# Patient Record
Sex: Male | Born: 1945 | Race: White | Hispanic: No | Marital: Single | Smoking: Current every day smoker
Health system: Southern US, Community
[De-identification: ages and names within clinical notes are randomized; demographics above are authoritative.]

## PROBLEM LIST (undated history)

## (undated) DIAGNOSIS — H539 Unspecified visual disturbance: Secondary | ICD-10-CM

## (undated) DIAGNOSIS — K219 Gastro-esophageal reflux disease without esophagitis: Secondary | ICD-10-CM

## (undated) DIAGNOSIS — N529 Male erectile dysfunction, unspecified: Secondary | ICD-10-CM

## (undated) DIAGNOSIS — N400 Enlarged prostate without lower urinary tract symptoms: Secondary | ICD-10-CM

## (undated) DIAGNOSIS — F32A Depression, unspecified: Secondary | ICD-10-CM

## (undated) DIAGNOSIS — H269 Unspecified cataract: Secondary | ICD-10-CM

## (undated) DIAGNOSIS — E291 Testicular hypofunction: Secondary | ICD-10-CM

## (undated) DIAGNOSIS — E782 Mixed hyperlipidemia: Secondary | ICD-10-CM

## (undated) DIAGNOSIS — I10 Essential (primary) hypertension: Secondary | ICD-10-CM

## (undated) DIAGNOSIS — F329 Major depressive disorder, single episode, unspecified: Secondary | ICD-10-CM

## (undated) DIAGNOSIS — G459 Transient cerebral ischemic attack, unspecified: Secondary | ICD-10-CM

## (undated) DIAGNOSIS — J449 Chronic obstructive pulmonary disease, unspecified: Secondary | ICD-10-CM

## (undated) DIAGNOSIS — R002 Palpitations: Secondary | ICD-10-CM

## (undated) HISTORY — DX: Essential (primary) hypertension: I10

## (undated) HISTORY — DX: Palpitations: R00.2

## (undated) HISTORY — DX: Testicular hypofunction: E29.1

## (undated) HISTORY — DX: Male erectile dysfunction, unspecified: N52.9

## (undated) HISTORY — DX: Unspecified visual disturbance: H53.9

## (undated) HISTORY — DX: Major depressive disorder, single episode, unspecified: F32.9

## (undated) HISTORY — DX: Chronic obstructive pulmonary disease, unspecified: J44.9

## (undated) HISTORY — DX: Depression, unspecified: F32.A

## (undated) HISTORY — DX: Benign prostatic hyperplasia without lower urinary tract symptoms: N40.0

## (undated) HISTORY — PX: COLONOSCOPY: SHX174

## (undated) HISTORY — DX: Unspecified cataract: H26.9

## (undated) HISTORY — PX: OTHER SURGICAL HISTORY: SHX169

## (undated) HISTORY — DX: Mixed hyperlipidemia: E78.2

---

## 2006-12-15 ENCOUNTER — Ambulatory Visit (HOSPITAL_BASED_OUTPATIENT_CLINIC_OR_DEPARTMENT_OTHER): Admission: RE | Admit: 2006-12-15 | Discharge: 2006-12-15 | Payer: Self-pay | Admitting: Surgery

## 2006-12-15 ENCOUNTER — Encounter (INDEPENDENT_AMBULATORY_CARE_PROVIDER_SITE_OTHER): Payer: Self-pay | Admitting: Surgery

## 2008-09-06 ENCOUNTER — Encounter: Admission: RE | Admit: 2008-09-06 | Discharge: 2008-09-06 | Payer: Self-pay | Admitting: Family Medicine

## 2008-10-04 ENCOUNTER — Encounter: Admission: RE | Admit: 2008-10-04 | Discharge: 2008-10-04 | Payer: Self-pay | Admitting: Family Medicine

## 2009-01-11 ENCOUNTER — Ambulatory Visit (HOSPITAL_COMMUNITY): Admission: RE | Admit: 2009-01-11 | Discharge: 2009-01-11 | Payer: Self-pay | Admitting: Family Medicine

## 2010-04-02 ENCOUNTER — Other Ambulatory Visit: Payer: Self-pay | Admitting: Family Medicine

## 2010-04-15 LAB — BLOOD GAS, ARTERIAL
Acid-Base Excess: 1.5 mmol/L (ref 0.0–2.0)
Drawn by: 12206
FIO2: 0.21 %
O2 Saturation: 96.2 %
pCO2 arterial: 38.3 mmHg (ref 35.0–45.0)
pO2, Arterial: 78 mmHg — ABNORMAL LOW (ref 80.0–100.0)

## 2010-05-28 NOTE — Op Note (Signed)
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   OPERATION:  Excision epidermoid cyst.   SURGEON:  Currie Paris, M.D.   ANESTHESIA:  Local.   CLINICAL HISTORY:  This cyst has been gradually enlarging.  Mr. Marlette  wished to have it a removed.   DESCRIPTION OF PROCEDURE:  The patient was seen in the minor procedure  room and we identified the cyst in question and marked it.  I then  prepped the area with some alcohol and anesthetized with 1% Xylocaine  with epi.  I waited 10 minutes.   There is then prepped with Betadine.  I made an elliptical incision,  excised this fairly large cyst intact with a little bit of subcu tissue  around it.   Everything appeared to be dry.  I closed in layers with 3-0 Vicryl, 4-0  Monocryl subcuticular plus Dermabond.   The patient and procedure well and there no complications.      Currie Paris, M.D.  Electronically Signed     CJS/MEDQ  D:  12/15/2006  T:  12/15/2006  Job:  [id number]

## 2012-03-12 ENCOUNTER — Other Ambulatory Visit: Payer: Self-pay | Admitting: Family Medicine

## 2012-03-12 DIAGNOSIS — R509 Fever, unspecified: Secondary | ICD-10-CM

## 2012-03-12 DIAGNOSIS — R1032 Left lower quadrant pain: Secondary | ICD-10-CM

## 2012-03-17 ENCOUNTER — Ambulatory Visit
Admission: RE | Admit: 2012-03-17 | Discharge: 2012-03-17 | Disposition: A | Discharge status: Home / Self Care | Payer: Medicare Other | Source: Ambulatory Visit | Attending: Family Medicine | Admitting: Family Medicine

## 2012-03-17 DIAGNOSIS — R509 Fever, unspecified: Secondary | ICD-10-CM

## 2012-03-17 DIAGNOSIS — R1032 Left lower quadrant pain: Secondary | ICD-10-CM

## 2012-03-17 MED ORDER — IOHEXOL 300 MG/ML  SOLN
100.0000 mL | Freq: Once | INTRAMUSCULAR | Status: AC | PRN
  Administered 2012-03-17: 100 mL via INTRAVENOUS

## 2012-07-06 ENCOUNTER — Ambulatory Visit: Payer: Medicare Other | Admitting: Nurse Practitioner

## 2014-03-30 ENCOUNTER — Other Ambulatory Visit: Payer: Self-pay | Admitting: Family Medicine

## 2014-03-30 DIAGNOSIS — F17210 Nicotine dependence, cigarettes, uncomplicated: Secondary | ICD-10-CM

## 2014-04-05 ENCOUNTER — Ambulatory Visit
Admission: RE | Admit: 2014-04-05 | Discharge: 2014-04-05 | Disposition: A | Discharge status: Home / Self Care | Payer: Medicare Other | Source: Ambulatory Visit | Attending: Family Medicine | Admitting: Family Medicine

## 2014-04-05 DIAGNOSIS — F17210 Nicotine dependence, cigarettes, uncomplicated: Secondary | ICD-10-CM

## 2014-04-12 ENCOUNTER — Other Ambulatory Visit (HOSPITAL_COMMUNITY): Payer: Self-pay | Admitting: Family Medicine

## 2014-04-12 DIAGNOSIS — R911 Solitary pulmonary nodule: Secondary | ICD-10-CM

## 2014-04-21 ENCOUNTER — Ambulatory Visit (HOSPITAL_COMMUNITY)
Admission: RE | Admit: 2014-04-21 | Discharge: 2014-04-21 | Disposition: A | Discharge status: Home / Self Care | Payer: Medicare Other | Source: Ambulatory Visit | Attending: Family Medicine | Admitting: Family Medicine

## 2014-04-21 DIAGNOSIS — R911 Solitary pulmonary nodule: Secondary | ICD-10-CM | POA: Diagnosis present

## 2014-04-21 LAB — GLUCOSE, CAPILLARY: Glucose-Capillary: 88 mg/dL (ref 70–99)

## 2014-04-21 MED ORDER — FLUDEOXYGLUCOSE F - 18 (FDG) INJECTION
7.5000 | Freq: Once | INTRAVENOUS | Status: AC | PRN
  Administered 2014-04-21: 7.5 via INTRAVENOUS

## 2014-05-23 ENCOUNTER — Other Ambulatory Visit (HOSPITAL_COMMUNITY): Payer: Self-pay | Admitting: Otolaryngology

## 2014-05-23 DIAGNOSIS — K118 Other diseases of salivary glands: Secondary | ICD-10-CM

## 2014-05-26 ENCOUNTER — Other Ambulatory Visit: Payer: Self-pay | Admitting: Radiology

## 2014-05-29 ENCOUNTER — Ambulatory Visit (HOSPITAL_COMMUNITY)
Admission: RE | Admit: 2014-05-29 | Discharge: 2014-05-29 | Disposition: A | Discharge status: Home / Self Care | Payer: Medicare Other | Source: Ambulatory Visit | Attending: Otolaryngology | Admitting: Otolaryngology

## 2014-05-29 DIAGNOSIS — R911 Solitary pulmonary nodule: Secondary | ICD-10-CM | POA: Insufficient documentation

## 2014-05-29 DIAGNOSIS — Z79899 Other long term (current) drug therapy: Secondary | ICD-10-CM | POA: Diagnosis not present

## 2014-05-29 DIAGNOSIS — K119 Disease of salivary gland, unspecified: Secondary | ICD-10-CM | POA: Diagnosis present

## 2014-05-29 DIAGNOSIS — K118 Other diseases of salivary glands: Secondary | ICD-10-CM

## 2014-05-29 LAB — CBC
HCT: 47.1 % (ref 39.0–52.0)
Hemoglobin: 17 g/dL (ref 13.0–17.0)
MCH: 33.5 pg (ref 26.0–34.0)
MCHC: 36.1 g/dL — ABNORMAL HIGH (ref 30.0–36.0)
MCV: 92.7 fL (ref 78.0–100.0)
PLATELETS: 152 10*3/uL (ref 150–400)
RBC: 5.08 MIL/uL (ref 4.22–5.81)
RDW: 12.5 % (ref 11.5–15.5)
WBC: 7.3 10*3/uL (ref 4.0–10.5)

## 2014-05-29 LAB — PROTIME-INR
INR: 1.15 (ref 0.00–1.49)
Prothrombin Time: 14.8 seconds (ref 11.6–15.2)

## 2014-05-29 LAB — APTT: aPTT: 32 seconds (ref 24–37)

## 2014-05-29 MED ORDER — FENTANYL CITRATE (PF) 100 MCG/2ML IJ SOLN
INTRAMUSCULAR | Status: AC | PRN
  Administered 2014-05-29 (×2): 50 ug via INTRAVENOUS

## 2014-05-29 MED ORDER — LIDOCAINE HCL (PF) 1 % IJ SOLN
INTRAMUSCULAR | Status: DC
Start: 2014-05-29 — End: 2014-05-30
  Filled 2014-05-29: qty 10

## 2014-05-29 MED ORDER — SODIUM CHLORIDE 0.9 % IV SOLN: INTRAVENOUS | Status: DC

## 2014-05-29 MED ORDER — FENTANYL CITRATE (PF) 100 MCG/2ML IJ SOLN
INTRAMUSCULAR | Status: AC
  Filled 2014-05-29: qty 2

## 2014-05-29 MED ORDER — MIDAZOLAM HCL 2 MG/2ML IJ SOLN
INTRAMUSCULAR | Status: AC
  Filled 2014-05-29: qty 2

## 2014-05-29 NOTE — Discharge Instructions (Signed)
Needle Biopsy °Care After °These instructions give you information on caring for yourself after your procedure. Your doctor may also give you more specific instructions. Call your doctor if you have any problems or questions after your procedure. °HOME CARE °· Rest for 4 hours after your biopsy, except for getting up to go to the bathroom or as told. °· Keep the places where the needles were put in clean and dry. °¨ Do not put powder or lotion on the sites. °¨ Do not shower until 24 hours after the test. Remove all bandages (dressings) before showering. °¨ Remove all bandages at least once every day. Gently clean the sites with soap and water. Keep putting a new bandage on until the skin is closed. °Finding out the results of your test °Ask your doctor when your test results will be ready. Make sure you follow up and get the test results. °GET HELP RIGHT AWAY IF:  °· You have shortness of breath or trouble breathing. °· You have pain or cramping in your belly (abdomen). °· You feel sick to your stomach (nauseous) or throw up (vomit). °· Any of the places where the needles were put in: °¨ Are puffy (swollen) or red. °¨ Are sore or hot to the touch. °¨ Are draining yellowish-white fluid (pus). °¨ Are bleeding after 10 minutes of pressing down on the site. Have someone keep pressing on any place that is bleeding until you see a doctor. °· You have any unusual pain that will not stop. °· You have a fever. °If you go to the emergency room, tell the nurse that you had a biopsy. Take this paper with you to show the nurse. °MAKE SURE YOU:  °· Understand these instructions. °· Will watch your condition. °· Will get help right away if you are not doing well or get worse. °Document Released: 12/13/2007 Document Revised: 03/24/2011 Document Reviewed: 12/13/2007 °ExitCare® Patient Information ©2015 [employer]. This information is not intended to replace advice given to you by your health care provider. Make sure you discuss  any questions you have with your health care provider. ° °

## 2014-05-29 NOTE — Procedures (Signed)
US guided FNA of left parotid lesion, 5 FNA samples obtained.  No immediate complication.

## 2014-05-29 NOTE — H&P (Signed)
Referring Physician(s): Wolicki,Karol  History of Present Illness: CYPHER PAULE is a 69 y.o. male with recent incidental findings of left parotid mass, hypermetabolic seen on PET 01/17/38 for work-up of lung nodule Seen by Dr. Erik Obey and scheduled today for image guided parotid mass biopsy. He denies any chest pain, shortness of breath or palpitations. He denies any active signs of bleeding or excessive bruising. He denies any recent fever or chills. The patient denies any history of sleep apnea or chronic oxygen use. He has previously tolerated sedation without complications.   No past medical history on file.  No past surgical history on file.  Allergies: Review of patient's allergies indicates no known allergies.  Medications: Prior to Admission medications   Medication Sig Start Date End Date Taking? Authorizing Provider  citalopram (CELEXA) 20 MG tablet Take 30 mg by mouth daily.  04/24/14  Yes Historical Provider, MD  Multiple Vitamin (MULTIVITAMIN WITH MINERALS) TABS tablet Take 1 tablet by mouth daily after supper.   Yes Historical Provider, MD  Omega-3 Fatty Acids (FISH OIL) 1000 MG CAPS Take 1,000 mg by mouth daily.   Yes Historical Provider, MD  pravastatin (PRAVACHOL) 10 MG tablet Take 10 mg by mouth daily after supper.  03/28/14  Yes Historical Provider, MD  PRESCRIPTION MEDICATION Apply 1 mL topically daily. Testosterone Lipoderm 20% compounded at [place] - apply to stomach or chest   Yes Historical Provider, MD  tiotropium (SPIRIVA) 18 MCG inhalation capsule Place 18 mcg into inhaler and inhale daily.   Yes Historical Provider, MD  triamterene-hydrochlorothiazide (MAXZIDE-25) 37.5-25 MG per tablet Take 1 tablet by mouth daily.  04/13/14  Yes Historical Provider, MD     No family history on file.  History   Social History  . Marital Status: Single    Spouse Name: N/A  . Number of Children: N/A  . Years of Education: N/A   Social History Main Topics    . Smoking status: Not on file  . Smokeless tobacco: Not on file  . Alcohol Use: Not on file  . Drug Use: Not on file  . Sexual Activity: Not on file   Other Topics Concern  . Not on file   Social History Narrative  . No narrative on file   Review of Systems: A 12 point ROS discussed and pertinent positives are indicated in the HPI above.  All other systems are negative.  Review of Systems  Vital Signs: BP 156/96 mmHg  Pulse 86  Temp(Src) 98.7 F (37.1 C) (Oral)  Resp 16  Ht 5' 7.5" (1.715 m)  Wt 148 lb (67.132 kg)  BMI 22.82 kg/m2  SpO2 95%  Physical Exam  Constitutional: He is oriented to person, place, and time. No distress.  HENT:  Head: Normocephalic and atraumatic.  Left palpable parotid mass  Neck: No tracheal deviation present.  Cardiovascular: Normal rate and regular rhythm.  Exam reveals no gallop and no friction rub.   No murmur heard. Pulmonary/Chest: Effort normal and breath sounds normal. No respiratory distress. He has no wheezes. He has no rales.  Abdominal: Soft. Bowel sounds are normal. He exhibits no distension. There is no tenderness.  Neurological: He is alert and oriented to person, place, and time.  Skin: He is not diaphoretic.   Mallampati Score:  MD Evaluation Airway: WNL Heart: WNL Abdomen: WNL Chest/ Lungs: WNL ASA  Classification: 2 Mallampati/Airway Score: One  Imaging: No results found.  Labs: CBC:  Recent Labs  05/29/14 1325  WBC 7.3  HGB 17.0  HCT 47.1  PLT 152   COAGS: No results for input(s): INR, APTT in the last 8760 hours.  Assessment and Plan: Incidental left parotid mass/hypermetabolic seen on PET 01/17/38 for work-up of lung nodule Seen by Dr. Erik Obey and scheduled today for image guided parotid mass biopsy with sedation The patient has been NPO, no blood thinners taken, labs and vitals have been reviewed. Risks and Benefits discussed with the patient including, but not limited to bleeding, infection, damage  to adjacent structures or low yield requiring additional tests. All of the patient's questions were answered, patient is agreeable to proceed. Consent signed and in chart.   Thank you for this interesting consult.  I greatly enjoyed meeting Kevin Brooks and look forward to participating in their care.  SignedHedy Jacob 05/29/2014, 1:39 PM

## 2014-12-21 ENCOUNTER — Other Ambulatory Visit: Payer: Self-pay | Admitting: Family Medicine

## 2014-12-21 ENCOUNTER — Telehealth: Payer: Self-pay | Admitting: Acute Care

## 2014-12-21 DIAGNOSIS — R911 Solitary pulmonary nodule: Secondary | ICD-10-CM

## 2014-12-21 NOTE — Telephone Encounter (Signed)
This patient had the recommendation for follow up scan in July 2016. I could not see the scan in our system, so called the office to see if they wanted a repeat scan. I spoke with Lauren who is going to talk with Dr. Sandi Mariscal and get the scan ordered. I have told her to let me know if they need any assistance in getting this done, as I would be happy to help. They have my name and contact information.

## 2014-12-28 ENCOUNTER — Ambulatory Visit
Admission: RE | Admit: 2014-12-28 | Discharge: 2014-12-28 | Disposition: A | Discharge status: Home / Self Care | Payer: Medicare Other | Source: Ambulatory Visit | Attending: Family Medicine | Admitting: Family Medicine

## 2014-12-28 DIAGNOSIS — R911 Solitary pulmonary nodule: Secondary | ICD-10-CM

## 2016-04-01 ENCOUNTER — Ambulatory Visit
Admission: RE | Admit: 2016-04-01 | Discharge: 2016-04-01 | Disposition: A | Discharge status: Home / Self Care | Payer: Medicare Other | Source: Ambulatory Visit | Attending: Family Medicine | Admitting: Family Medicine

## 2016-04-01 ENCOUNTER — Other Ambulatory Visit: Payer: Self-pay | Admitting: Family Medicine

## 2016-04-01 DIAGNOSIS — R059 Cough, unspecified: Secondary | ICD-10-CM

## 2016-04-01 DIAGNOSIS — R05 Cough: Secondary | ICD-10-CM

## 2016-04-22 ENCOUNTER — Other Ambulatory Visit: Payer: Self-pay | Admitting: Family Medicine

## 2016-04-23 ENCOUNTER — Other Ambulatory Visit: Payer: Self-pay | Admitting: Family Medicine

## 2016-04-23 DIAGNOSIS — R911 Solitary pulmonary nodule: Secondary | ICD-10-CM

## 2016-04-23 DIAGNOSIS — F17200 Nicotine dependence, unspecified, uncomplicated: Secondary | ICD-10-CM

## 2016-05-07 ENCOUNTER — Ambulatory Visit
Admission: RE | Admit: 2016-05-07 | Discharge: 2016-05-07 | Disposition: A | Discharge status: Home / Self Care | Payer: Medicare Other | Source: Ambulatory Visit | Attending: Family Medicine | Admitting: Family Medicine

## 2016-05-07 DIAGNOSIS — R911 Solitary pulmonary nodule: Secondary | ICD-10-CM

## 2016-05-07 DIAGNOSIS — F17200 Nicotine dependence, unspecified, uncomplicated: Secondary | ICD-10-CM

## 2016-05-20 ENCOUNTER — Other Ambulatory Visit: Payer: Self-pay | Admitting: Family Medicine

## 2016-05-20 DIAGNOSIS — R918 Other nonspecific abnormal finding of lung field: Secondary | ICD-10-CM

## 2016-10-21 ENCOUNTER — Other Ambulatory Visit: Payer: Self-pay | Admitting: Family Medicine

## 2016-10-21 DIAGNOSIS — R482 Apraxia: Secondary | ICD-10-CM

## 2016-10-21 DIAGNOSIS — R519 Headache, unspecified: Secondary | ICD-10-CM

## 2016-10-21 DIAGNOSIS — R51 Headache: Secondary | ICD-10-CM

## 2016-10-25 ENCOUNTER — Ambulatory Visit
Admission: RE | Admit: 2016-10-25 | Discharge: 2016-10-25 | Disposition: A | Discharge status: Home / Self Care | Payer: Medicare Other | Source: Ambulatory Visit | Attending: Family Medicine | Admitting: Family Medicine

## 2016-10-25 DIAGNOSIS — R482 Apraxia: Secondary | ICD-10-CM

## 2016-10-25 DIAGNOSIS — R51 Headache: Secondary | ICD-10-CM

## 2016-10-25 DIAGNOSIS — R519 Headache, unspecified: Secondary | ICD-10-CM

## 2016-10-25 MED ORDER — GADOBENATE DIMEGLUMINE 529 MG/ML IV SOLN
13.0000 mL | Freq: Once | INTRAVENOUS | Status: AC | PRN
  Administered 2016-10-25: 13 mL via INTRAVENOUS

## 2016-10-27 ENCOUNTER — Other Ambulatory Visit: Payer: Medicare Other

## 2016-10-29 ENCOUNTER — Other Ambulatory Visit: Payer: Self-pay | Admitting: Family Medicine

## 2016-10-29 DIAGNOSIS — I639 Cerebral infarction, unspecified: Secondary | ICD-10-CM

## 2016-10-30 ENCOUNTER — Other Ambulatory Visit (HOSPITAL_COMMUNITY): Payer: Self-pay | Admitting: Family Medicine

## 2016-10-30 ENCOUNTER — Ambulatory Visit (HOSPITAL_COMMUNITY)
Admission: RE | Admit: 2016-10-30 | Discharge: 2016-10-30 | Disposition: A | Discharge status: Home / Self Care | Payer: Medicare Other | Source: Ambulatory Visit | Attending: Vascular Surgery | Admitting: Vascular Surgery

## 2016-10-30 DIAGNOSIS — I6529 Occlusion and stenosis of unspecified carotid artery: Secondary | ICD-10-CM

## 2016-10-30 DIAGNOSIS — I6523 Occlusion and stenosis of bilateral carotid arteries: Secondary | ICD-10-CM | POA: Diagnosis not present

## 2016-10-30 LAB — VAS US CAROTID
LEFT ECA DIAS: -13 cm/s
LEFT VERTEBRAL DIAS: 11 cm/s
Left CCA dist dias: 13 cm/s
Left CCA dist sys: 74 cm/s
Left CCA prox dias: 16 cm/s
Left CCA prox sys: 113 cm/s
Left ICA dist dias: -21 cm/s
Left ICA dist sys: -74 cm/s
Left ICA prox dias: -13 cm/s
Left ICA prox sys: -39 cm/s
RIGHT CCA MID DIAS: 12 cm/s
RIGHT ECA DIAS: -5 cm/s
RIGHT VERTEBRAL DIAS: -11 cm/s
Right CCA prox dias: 12 cm/s
Right CCA prox sys: 110 cm/s
Right cca dist sys: -56 cm/s

## 2016-11-04 ENCOUNTER — Ambulatory Visit (HOSPITAL_COMMUNITY): Payer: Medicare Other | Attending: Cardiology

## 2016-11-04 ENCOUNTER — Other Ambulatory Visit: Payer: Self-pay

## 2016-11-04 DIAGNOSIS — I119 Hypertensive heart disease without heart failure: Secondary | ICD-10-CM | POA: Diagnosis not present

## 2016-11-04 DIAGNOSIS — I639 Cerebral infarction, unspecified: Secondary | ICD-10-CM | POA: Insufficient documentation

## 2016-11-04 DIAGNOSIS — I340 Nonrheumatic mitral (valve) insufficiency: Secondary | ICD-10-CM | POA: Diagnosis not present

## 2016-11-04 DIAGNOSIS — E785 Hyperlipidemia, unspecified: Secondary | ICD-10-CM | POA: Diagnosis not present

## 2016-11-07 ENCOUNTER — Ambulatory Visit
Admission: RE | Admit: 2016-11-07 | Discharge: 2016-11-07 | Disposition: A | Discharge status: Home / Self Care | Payer: Medicare Other | Source: Ambulatory Visit | Attending: Family Medicine | Admitting: Family Medicine

## 2016-11-07 DIAGNOSIS — R918 Other nonspecific abnormal finding of lung field: Secondary | ICD-10-CM

## 2016-12-15 ENCOUNTER — Encounter: Payer: Self-pay | Admitting: Neurology

## 2016-12-15 ENCOUNTER — Ambulatory Visit: Payer: Medicare Other | Admitting: Neurology

## 2016-12-15 VITALS — BP 128/76 | HR 85 | Ht 67.0 in | Wt 139.4 lb

## 2016-12-15 DIAGNOSIS — G459 Transient cerebral ischemic attack, unspecified: Secondary | ICD-10-CM | POA: Diagnosis not present

## 2016-12-15 DIAGNOSIS — R2681 Unsteadiness on feet: Secondary | ICD-10-CM

## 2016-12-15 NOTE — Patient Instructions (Signed)
I had a long d/w patient about his recent episode of transient right hand incoordination and gait instability likely represents a small left brain subcortical infarct not visualized on MRI as it was done 10 days later and discussed risk for recurrent stroke/TIAs, personally independently reviewed imaging studies and stroke evaluation results and answered questions.. Plan to check MRA of the brain and neck to look for significant occlusive posterior circulation disease. Continue aspirin 81 mg daily  and Plavix 75 mg daily for one more month and then stop aspirin and stay on Plavix alone for secondary stroke prevention and maintain strict control of hypertension with blood pressure goal below 130/90, diabetes with hemoglobin A1c goal below 6.5% and lipids with LDL cholesterol goal below 70 mg/dL. I also advised the patient to eat a healthy diet with plenty of whole grains, cereals, fruits and vegetables, exercise regularly and maintain ideal body weight. We also talked about possible interest in participation in the [place] stroke study if interested Followup in the future with me in 3 months or call earlier if necessary.  Stroke Prevention Some medical conditions and behaviors are associated with an increased chance of having a stroke. You may prevent a stroke by making healthy choices and managing medical conditions. How can I reduce my risk of having a stroke?  Stay physically active. Get at least 30 minutes of activity on most or all days.  Do not smoke. It may also be helpful to avoid exposure to secondhand smoke.  Limit alcohol use. Moderate alcohol use is considered to be: ? No more than 2 drinks per day for men. ? No more than 1 drink per day for nonpregnant women.  Eat healthy foods. This involves: ? Eating 5 or more servings of fruits and vegetables a day. ? Making dietary changes that address high blood pressure (hypertension), high cholesterol, diabetes, or obesity.  Manage your  cholesterol levels. ? Making food choices that are high in fiber and low in saturated fat, trans fat, and cholesterol may control cholesterol levels. ? Take any prescribed medicines to control cholesterol as directed by your health care provider.  Manage your diabetes. ? Controlling your carbohydrate and sugar intake is recommended to manage diabetes. ? Take any prescribed medicines to control diabetes as directed by your health care provider.  Control your hypertension. ? Making food choices that are low in salt (sodium), saturated fat, trans fat, and cholesterol is recommended to manage hypertension. ? Ask your health care provider if you need treatment to lower your blood pressure. Take any prescribed medicines to control hypertension as directed by your health care provider. ? If you are 55-18 years of age, have your blood pressure checked every 3-5 years. If you are 36 years of age or older, have your blood pressure checked every year.  Maintain a healthy weight. ? Reducing calorie intake and making food choices that are low in sodium, saturated fat, trans fat, and cholesterol are recommended to manage weight.  Stop drug abuse.  Avoid taking birth control pills. ? Talk to your health care provider about the risks of taking birth control pills if you are over 2 years old, smoke, get migraines, or have ever had a blood clot.  Get evaluated for sleep disorders (sleep apnea). ? Talk to your health care provider about getting a sleep evaluation if you snore a lot or have excessive sleepiness.  Take medicines only as directed by your health care provider. ? For some people, aspirin or blood thinners (anticoagulants)  are helpful in reducing the risk of forming abnormal blood clots that can lead to stroke. If you have the irregular heart rhythm of atrial fibrillation, you should be on a blood thinner unless there is a good reason you cannot take them. ? Understand all your medicine  instructions.  Make sure that other conditions (such as anemia or atherosclerosis) are addressed. Get help right away if:  You have sudden weakness or numbness of the face, arm, or leg, especially on one side of the body.  Your face or eyelid droops to one side.  You have sudden confusion.  You have trouble speaking (aphasia) or understanding.  You have sudden trouble seeing in one or both eyes.  You have sudden trouble walking.  You have dizziness.  You have a loss of balance or coordination.  You have a sudden, severe headache with no known cause.  You have new chest pain or an irregular heartbeat. Any of these symptoms may represent a serious problem that is an emergency. Do not wait to see if the symptoms will go away. Get medical help at once. Call your local emergency services (911 in [place] Do not drive yourself to the hospital. This information is not intended to replace advice given to you by your health care provider. Make sure you discuss any questions you have with your health care provider. Document Released: 02/07/2004 Document Revised: 06/07/2015 Document Reviewed: 07/02/2012 Elsevier Interactive Patient Education  2017 [employer].

## 2016-12-15 NOTE — Progress Notes (Signed)
[facility] [address] [place]. [place] [address] [phone]       OFFICE CONSULT NOTE  Kevin Brooks Date of Birth:  01-17-1945 Medical Record Number:  [id number]   Referring MD: Derinda Late  Reason for Referral: TIA  HPI: Kevin Brooks is a pleasant 71 year old Caucasian male who developed sudden onset of right hand incoordination and weakness around 10/15/16.  He noted difficulty writing checks and using his hand for fine motor skills.  He also noticed some worsening of his gait and balance which was slightly off.  He did not fall but had to be careful while making turns.  He denies any vertigo, headache, blurred vision or numbness.  His symptoms recovered completely in 3-4 days.  He was seen by Dr. Sandi Mariscal who ordered an outpatient MRI scan of the brain which I personally reviewed was done on 10/25/16 showed no acute abnormalities.  He had further outpatient evaluation including transthoracic echo and carotid Dopplers which were unremarkable.  LDL cholesterol was 36 mg percent.  HDL was 75 and triglycerides 52 mg percent.  He had been on Crestor.  He was started on aspirin and Plavix which she is tolerating well but does get minor bruising and easy bleeding.  He has not had any further recurrent symptoms.  Denies any prior history of strokes or TIAs.  MRI of the brain had also shown incidental 2.4 cm left parotid mass and this was unchanged compared with previous PET/CT without definite enlargement from 2016  ROS:   14 system review of systems is positive for easy bruising, easy bleeding, joint pain, depression, skin moles and all other systems negative  PMH:  Past Medical History:  Diagnosis Date  . BPH (benign prostatic hyperplasia)   . Cataracta   . COPD (chronic obstructive pulmonary disease) ([place])   . Depression   . ED (erectile dysfunction)   . Hypertension   . Hypogonadism male   . Mixed hyperlipidemia   . Palpitations   . Vision abnormalities      Social History:  Social History   Socioeconomic History  . Marital status: Single    Spouse name: Not on file  . Number of children: Not on file  . Years of education: Not on file  . Highest education level: Not on file  Social Needs  . Financial resource strain: Not on file  . Food insecurity - worry: Not on file  . Food insecurity - inability: Not on file  . Transportation needs - medical: Not on file  . Transportation needs - non-medical: Not on file  Occupational History  . Not on file  Tobacco Use  . Smoking status: Current Every Day Smoker    Packs/day: 0.50    Types: Cigarettes  . Smokeless tobacco: Never Used  Substance and Sexual Activity  . Alcohol use: Yes    Alcohol/week: 0.6 oz    Types: 1 Cans of beer per week    Comment: beer daily  . Drug use: No  . Sexual activity: Not on file  Other Topics Concern  . Not on file  Social History Narrative  . Not on file    Medications:   Current Outpatient Medications on File Prior to Visit  Medication Sig Dispense Refill  . aspirin 81 MG tablet Take 81 mg by mouth daily.    . citalopram (CELEXA) 20 MG tablet     . clopidogrel (PLAVIX) 75 MG tablet TK 1 T PO D  1  .  Multiple Vitamin (MULTIVITAMIN WITH MINERALS) TABS tablet Take 1 tablet by mouth daily after supper.    [facility] PRESCRIPTION MEDICATION Apply 1 mL topically daily. Testosterone Lipoderm 20% compounded at [facility] - apply to stomach or chest    . rosuvastatin (CRESTOR) 5 MG tablet Take 5 mg by mouth daily.    [facility] tiotropium (SPIRIVA HANDIHALER) 18 MCG inhalation capsule     . triamterene-hydrochlorothiazide (MAXZIDE-25) 37.5-25 MG per tablet Take 1 tablet by mouth daily.   3   No current facility-administered medications on file prior to visit.     Allergies:  No Known Allergies  Physical Exam General: well developed, well nourished pleasant middle-aged Caucasian male not in distress, seated, Head: head normocephalic and atraumatic.   Neck:  supple with no carotid or supraclavicular bruits Cardiovascular: regular rate and rhythm, no murmurs Musculoskeletal: no deformity Skin:  no rash/petichiae Vascular:  Normal pulses all extremities  Neurologic Exam Mental Status: Awake and fully alert. Oriented to place and time. Recent and remote memory intact. Attention span, concentration and fund of knowledge appropriate. Mood and affect appropriate.  Cranial Nerves: Fundoscopic exam reveals sharp disc margins. Pupils equal, briskly reactive to light. Extraocular movements full without nystagmus. Visual fields full to confrontation. Hearing intact. Facial sensation intact. Face, tongue, palate moves normally and symmetrically.  Motor: Normal bulk and tone. Normal strength in all tested extremity muscles. Sensory.: intact to touch , pinprick , position and vibratory sensation.  Coordination: Rapid alternating movements normal in all extremities. Finger-to-nose and heel-to-shin performed accurately bilaterally. Gait and Station: Arises from chair without difficulty. Stance is normal. Gait demonstrates normal stride length and balance . Able to heel, toe and tandem walk with mild difficulty.  Reflexes: 2+ and asymmetric and brisker on the right.. Toes downgoing.   NIHSS 0 Modified Rankin 0   ASSESSMENT: 61 year Caucasian male with episode of transient right hand weakness possibly small left brain subcortical infarct not visualized on the MRI as it was done 10 days later.  Clinical exam is nonfocal except slight hyperreflexia on the right side.  Etiology likely small vessel disease.  Vascular risk factors of hypertension and hyperlipidemia.     PLAN: I had a long d/w patient about his recent episode of transient right hand incoordination and gait instability likely represents a small left brain subcortical infarct not visualized on MRI as it was done 10 days later and discussed risk for recurrent stroke/TIAs, personally independently  reviewed imaging studies and stroke evaluation results and answered questions.. Plan to check MRA of the brain and neck to look for significant occlusive posterior circulation disease. Continue aspirin 81 mg daily  and Plavix 75 mg daily for one more month and then stop aspirin and stay on Plavix alone for secondary stroke prevention and maintain strict control of hypertension with blood pressure goal below 130/90, diabetes with hemoglobin A1c goal below 6.5% and lipids with LDL cholesterol goal below 70 mg/dL. I also advised the patient to eat a healthy diet with plenty of whole grains, cereals, fruits and vegetables, exercise regularly and maintain ideal body weight. We also talked about possible interest in participation in the [place] stroke study if interested Followup in the future with me in 3 months or call earlier if necessary. Greater than 50% time during this 50-minute consultation visit was spent counseling and coordination of care about his TIA, risk for recurrent stroke, stroke risk stratification and management discussion and answering questions. Antony Contras, [facility] [phone]  [address] [place], [place] [address]  Phone [phone] Fax [phone] Note: This document was prepared with digital dictation and possible smart phrase technology. Any transcriptional errors that result from this process are unintentional.

## 2016-12-25 ENCOUNTER — Ambulatory Visit
Admission: RE | Admit: 2016-12-25 | Discharge: 2016-12-25 | Disposition: A | Discharge status: Home / Self Care | Payer: Medicare Other | Source: Ambulatory Visit | Attending: Neurology | Admitting: Neurology

## 2016-12-25 DIAGNOSIS — G459 Transient cerebral ischemic attack, unspecified: Secondary | ICD-10-CM | POA: Diagnosis not present

## 2016-12-25 MED ORDER — GADOBENATE DIMEGLUMINE 529 MG/ML IV SOLN: 13.0000 mL | Freq: Once | INTRAVENOUS | Status: DC | PRN

## 2016-12-30 ENCOUNTER — Telehealth: Payer: Self-pay

## 2016-12-30 NOTE — Telephone Encounter (Signed)
-----   Message from Garvin Fila, MD sent at 12/25/2016  6:10 PM EST ----- Kindly inform the patient that MRA of the brain and neck both showed no significant blockages of the blood vessels.  Small left parotid swelling is unchanged from prior scans.

## 2016-12-30 NOTE — Telephone Encounter (Signed)
Notes recorded by Marval Regal, RN on 12/30/2016 at 10:19 AM EST Rn call patient that the MRA brain, and neck showed no significant blockages of the blood vessels. Still has small left parotid swelling unchanged from prior scans. Pt verbalized understanding. ------

## 2017-01-26 ENCOUNTER — Telehealth: Payer: Self-pay

## 2017-01-26 NOTE — Telephone Encounter (Signed)
Clearance form fax to [facility] at [phone]. Form fax twice and receive.

## 2017-02-11 ENCOUNTER — Other Ambulatory Visit: Payer: Self-pay | Admitting: Family Medicine

## 2017-02-11 DIAGNOSIS — R911 Solitary pulmonary nodule: Secondary | ICD-10-CM

## 2017-02-17 ENCOUNTER — Other Ambulatory Visit: Payer: Medicare Other

## 2017-02-25 ENCOUNTER — Ambulatory Visit
Admission: RE | Admit: 2017-02-25 | Discharge: 2017-02-25 | Disposition: A | Discharge status: Home / Self Care | Payer: Medicare Other | Source: Ambulatory Visit | Attending: Family Medicine | Admitting: Family Medicine

## 2017-02-25 DIAGNOSIS — R911 Solitary pulmonary nodule: Secondary | ICD-10-CM

## 2017-03-23 ENCOUNTER — Encounter: Payer: Self-pay | Admitting: Neurology

## 2017-03-23 ENCOUNTER — Ambulatory Visit: Payer: Medicare Other | Admitting: Neurology

## 2017-03-23 ENCOUNTER — Telehealth: Payer: Self-pay | Admitting: Neurology

## 2017-03-23 VITALS — BP 130/89 | HR 87 | Wt 138.4 lb

## 2017-03-23 DIAGNOSIS — I6990 Unspecified sequelae of unspecified cerebrovascular disease: Secondary | ICD-10-CM | POA: Diagnosis not present

## 2017-03-23 NOTE — Patient Instructions (Signed)
I had a long d/w patient about his recent stroke/TIA, risk for recurrent stroke/TIAs, personally independently reviewed imaging studies and stroke evaluation results and answered questions.Continue Plavix for secondary stroke prevention and maintain strict control of hypertension with blood pressure goal below 130/90, diabetes with hemoglobin A1c goal below 6.5% and lipids with LDL cholesterol goal below 70 mg/dL. I also advised the patient to eat a healthy diet with plenty of whole grains, cereals, fruits and vegetables, exercise regularly and maintain ideal body weight Followup in the future with my nurse practitioner in 6 months or call earlier if necessary

## 2017-03-23 NOTE — Progress Notes (Signed)
[facility] [address] [place]. [place] [address] [phone]       OFFICE CONSULT NOTE  Mr. Kevin Brooks Date of Birth:  September 16, 1945 Medical Record Number:  [id number]   Referring MD: Derinda Late  Reason for Referral: TIA  HPI: Initial consultation visit 12/15/2016 Kevin Brooks is a pleasant 72 year old Caucasian male who developed sudden onset of right hand incoordination and weakness around 10/15/16.  He noted difficulty writing checks and using his hand for fine motor skills.  He also noticed some worsening of his gait and balance which was slightly off.  He did not fall but had to be careful while making turns.  He denies any vertigo, headache, blurred vision or numbness.  His symptoms recovered completely in 3-4 days.  He was seen by Dr. Sandi Mariscal who ordered an outpatient MRI scan of the brain which I personally reviewed was done on 10/25/16 showed no acute abnormalities.  He had further outpatient evaluation including transthoracic echo and carotid Dopplers which were unremarkable.  LDL cholesterol was 36 mg percent.  HDL was 75 and triglycerides 52 mg percent.  He had been on Crestor.  He was started on aspirin and Plavix which she is tolerating well but does get minor bruising and easy bleeding.  He has not had any further recurrent symptoms.  Denies any prior history of strokes or TIAs.  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 He states he is doing well and has not had any recurrent stroke or TIA symptoms. He did develop some rectal bleeding for a few days towards Christmas and stop his aspirin and has continued Plavix alone and has done well since then. His blood pressure is well controlled and is tolerating his medications well. He remains on Crestor and denies any side  effects. He plans on having a annual physical exam with his primary physician next month and will have follow-up lab work checked. He has no new complaints. ROS:   14 system review of systems is positive for easy bruising, easy bleeding, decreased urination, frequent waking, depression and all other systems negative  PMH:  Past Medical History:  Diagnosis Date  . BPH (benign prostatic hyperplasia)   . Cataracta   . COPD (chronic obstructive pulmonary disease) ([place])   . Depression   . ED (erectile dysfunction)   . Hypertension   . Hypogonadism male   . Mixed hyperlipidemia   . Palpitations   . Vision abnormalities     Social History:  Social History   Socioeconomic History  . Marital status: Single    Spouse name: Not on file  . Number of children: Not on file  . Years of education: Not on file  . Highest education level: Not on file  Social Needs  . Financial resource strain: Not on file  . Food insecurity - worry: Not on file  . Food insecurity - inability: Not on file  . Transportation needs - medical: Not on file  . Transportation needs - non-medical: Not on file  Occupational History  . Not on file  Tobacco Use  . Smoking status: Current Every Day Smoker    Packs/day: 0.25    Types: Cigarettes  . Smokeless tobacco: Never Used  . Tobacco comment: smokes 5 per day  Substance and Sexual Activity  .  Alcohol use: Yes    Alcohol/week: 0.6 oz    Types: 1 Cans of beer per week    Comment: beer daily  . Drug use: No  . Sexual activity: Not on file  Other Topics Concern  . Not on file  Social History Narrative  . Not on file    Medications:   Current Outpatient Medications on File Prior to Visit  Medication Sig Dispense Refill  . buPROPion (WELLBUTRIN XL) 150 MG 24 hr tablet TK 1 T PO QAM    . citalopram (CELEXA) 20 MG tablet Take 30 mg by mouth daily.     . clopidogrel (PLAVIX) 75 MG tablet TK 1 T PO D  1  . hydrochlorothiazide (HYDRODIURIL) 25 MG tablet TK 1 T  PO QAM  1  . Multiple Vitamin (MULTIVITAMIN WITH MINERALS) TABS tablet Take 1 tablet by mouth daily after supper.    Marland Kitchen PRESCRIPTION MEDICATION Apply 1 mL topically daily. Testosterone Lipoderm 20% compounded at [facility] - apply to stomach or chest    . rosuvastatin (CRESTOR) 5 MG tablet Take 5 mg by mouth daily.    Marland Kitchen tiotropium (SPIRIVA HANDIHALER) 18 MCG inhalation capsule      No current facility-administered medications on file prior to visit.     Allergies:  No Known Allergies  Physical Exam General: well developed, well nourished pleasant middle-aged Caucasian male not in distress, seated, Head: head normocephalic and atraumatic.   Neck: supple with no carotid or supraclavicular bruits Cardiovascular: regular rate and rhythm, no murmurs Musculoskeletal: no deformity Skin:  no rash/petichiae Vascular:  Normal pulses all extremities  Neurologic Exam Mental Status: Awake and fully alert. Oriented to place and time. Recent and remote memory intact. Attention span, concentration and fund of knowledge appropriate. Mood and affect appropriate.  Cranial Nerves: Fundoscopic exam not done Pupils equal, briskly reactive to light. Extraocular movements full without nystagmus. Visual fields full to confrontation. Hearing intact. Facial sensation intact. Face, tongue, palate moves normally and symmetrically.  Motor: Normal bulk and tone. Normal strength in all tested extremity muscles. Sensory.: intact to touch , pinprick , position and vibratory sensation.  Coordination: Rapid alternating movements normal in all extremities. Finger-to-nose and heel-to-shin performed accurately bilaterally. Gait and Station: Arises from chair without difficulty. Stance is normal. Gait demonstrates normal stride length and balance . Able to heel, toe and tandem walk with  difficulty.  Reflexes: 2+ and asymmetric and brisker on the right.. Toes downgoing.   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  Clinical exam is nonfocal except slight hyperreflexia on the right side.  Etiology likely small vessel disease.  Vascular risk factors of hypertension and hyperlipidemia.     PLAN: I had a long d/w patient about his recent stroke/TIA, risk for recurrent stroke/TIAs, personally independently reviewed imaging studies and stroke evaluation results and answered questions.Continue Plavix for secondary stroke prevention and maintain strict control of hypertension with blood pressure goal below 130/90, diabetes with hemoglobin A1c goal below 6.5% and lipids with LDL cholesterol goal below 70 mg/dL. I also advised the patient to eat a healthy diet with plenty of whole grains, cereals, fruits and vegetables, exercise regularly and maintain ideal body weight Followup in the future with my nurse practitioner in 6 months or call earlier if necessary Greater than 50% time  during this 30-minute  visit was spent counseling and coordination of care about his TIA, risk for recurrent stroke, stroke risk stratification and management discussion and answering questions. Antony Contras, MD  [facility] [address] [place], [place] [address]  Phone [phone] Fax [phone] Note: This document was prepared with digital dictation and possible smart phrase technology. Any transcriptional errors that result from this process are unintentional.

## 2017-03-23 NOTE — Telephone Encounter (Signed)
Patient was seen today by Dr. Leonie Man. Pt did not know of one blood pressure meds that was recently prescribed. Telminsartan 80mg  added to patients medication list, and updated.

## 2017-03-23 NOTE — Telephone Encounter (Signed)
Pt called stating he needed to inform the RN of a new medication Telminsartan 80MG 

## 2017-09-23 ENCOUNTER — Ambulatory Visit: Payer: Medicare Other | Admitting: Adult Health

## 2017-10-02 ENCOUNTER — Ambulatory Visit: Payer: Medicare Other | Admitting: Adult Health

## 2017-10-02 ENCOUNTER — Encounter: Payer: Self-pay | Admitting: Adult Health

## 2017-10-02 VITALS — BP 132/91 | HR 62 | Ht 67.5 in | Wt 137.4 lb

## 2017-10-02 DIAGNOSIS — G459 Transient cerebral ischemic attack, unspecified: Secondary | ICD-10-CM

## 2017-10-02 DIAGNOSIS — R2681 Unsteadiness on feet: Secondary | ICD-10-CM | POA: Diagnosis not present

## 2017-10-02 DIAGNOSIS — I10 Essential (primary) hypertension: Secondary | ICD-10-CM | POA: Diagnosis not present

## 2017-10-02 DIAGNOSIS — E785 Hyperlipidemia, unspecified: Secondary | ICD-10-CM | POA: Diagnosis not present

## 2017-10-02 NOTE — Patient Instructions (Signed)
Continue clopidogrel 75 mg daily  and Crestor  for secondary stroke prevention  Continue to follow up with PCP regarding cholesterol and blood pressure management   Continue to stay active and maintain a healthy diet  Continue to monitor blood pressure at home  Maintain strict control of hypertension with blood pressure goal below 130/90, diabetes with hemoglobin A1c goal below 6.5% and cholesterol with LDL cholesterol (bad cholesterol) goal below 70 mg/dL. I also advised the patient to eat a healthy diet with plenty of whole grains, cereals, fruits and vegetables, exercise regularly and maintain ideal body weight.  Followup in the future with me as needed or call with questions or concerns or need of follow up visit        Thank you for coming to see [place] at [facility]. I hope we have been able to provide you high quality care today.  You may receive a patient satisfaction survey over the next few weeks. We would appreciate your feedback and comments so that we may continue to improve ourselves and the health of our patients.

## 2017-10-02 NOTE — Progress Notes (Signed)
[facility] [address] [place]. [place] [address] [phone]       OFFICE CONSULT NOTE  Kevin Brooks Date of Birth:  [birth year] Medical Record Number:  [id number]   Referring MD: Derinda Late  Reason for Referral: TIA  Chief Complaint  Patient presents with  . Follow-up    6 month follow up. Alone. Rm 9. No new changes or concerns at this time.     HPI: Initial consultation visit 12/15/2016 Mr. Kevin Brooks is a pleasant 72 year old Caucasian male who developed sudden onset of right hand incoordination and weakness around 10/15/16.  He noted difficulty writing checks and using his hand for fine motor skills.  He also noticed some worsening of his gait and balance which was slightly off.  He did not fall but had to be careful while making turns.  He denies any vertigo, headache, blurred vision or numbness.  His symptoms recovered completely in 3-4 days.  He was seen by Dr. Sandi Mariscal who ordered an outpatient MRI scan of the brain which I personally reviewed was done on 10/25/16 showed no acute abnormalities.  He had further outpatient evaluation including transthoracic echo and carotid Dopplers which were unremarkable.  LDL cholesterol was 36 mg percent.  HDL was 75 and triglycerides 52 mg percent.  He had been on Crestor.  He was started on aspirin and Plavix which she is tolerating well but does get minor bruising and easy bleeding.  He has not had any further recurrent symptoms.  Denies any prior history of strokes or TIAs.  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 He states he is doing well and has not had any recurrent stroke or TIA symptoms. He did develop some rectal bleeding for a few days towards Christmas and stop his aspirin and has continued Plavix alone and has  done well since then. His blood pressure is well controlled and is tolerating his medications well. He remains on Crestor and denies any side effects. He plans on having a annual physical exam with his primary physician next month and will have follow-up lab work checked. He has no new complaints.  Interval history 10/02/2017: Patient being seen today for scheduled follow-up appointment.  Overall he states he has been doing well without recurring of symptoms.  He does have intermittent balance issues but states they are not debilitating and denies any recent falls.  He continues to stay active along with working part-time.  Continues to take Plavix without side effects of bleeding or bruising.  Continues to take Crestor without side effects myalgias and continues to be followed by PCP for HLD monitoring and managing.  States most recent lipid panel was satisfactory without need of changing medication.  Blood pressure today satisfactory at 132/91.  Patient does state that he does monitor this intermittently at home and typically 120/80.  Denies new or worsening stroke/TIA symptoms.  ROS:   14 system review of systems is positive for no complaints and all other systems negative  PMH:  Past Medical History:  Diagnosis Date  . BPH (benign prostatic hyperplasia)   . Cataracta   . COPD (chronic obstructive pulmonary disease) ([place])   . Depression   . ED (erectile dysfunction)   . Hypertension   . Hypogonadism male   . Mixed hyperlipidemia   .  Palpitations   . Vision abnormalities     Social History:  Social History   Socioeconomic History  . Marital status: Single    Spouse name: Not on file  . Number of children: Not on file  . Years of education: Not on file  . Highest education level: Not on file  Occupational History  . Not on file  Social Needs  . Financial resource strain: Not on file  . Food insecurity:    Worry: Not on file    Inability: Not on file  . Transportation needs:     Medical: Not on file    Non-medical: Not on file  Tobacco Use  . Smoking status: Current Every Day Smoker    Packs/day: 0.25    Types: Cigarettes  . Smokeless tobacco: Never Used  . Tobacco comment: smokes 5 per day  Substance and Sexual Activity  . Alcohol use: Yes    Alcohol/week: 1.0 standard drinks    Types: 1 Cans of beer per week    Comment: beer daily  . Drug use: No  . Sexual activity: Not on file  Lifestyle  . Physical activity:    Days per week: Not on file    Minutes per session: Not on file  . Stress: Not on file  Relationships  . Social connections:    Talks on phone: Not on file    Gets together: Not on file    Attends religious service: Not on file    Active member of club or organization: Not on file    Attends meetings of clubs or organizations: Not on file    Relationship status: Not on file  . Intimate partner violence:    Fear of current or ex partner: Not on file    Emotionally abused: Not on file    Physically abused: Not on file    Forced sexual activity: Not on file  Other Topics Concern  . Not on file  Social History Narrative  . Not on file    Medications:   Current Outpatient Medications on File Prior to Visit  Medication Sig Dispense Refill  . citalopram (CELEXA) 20 MG tablet Take 30 mg by mouth daily.     . clopidogrel (PLAVIX) 75 MG tablet TK 1 T PO D  1  . hydrochlorothiazide (HYDRODIURIL) 25 MG tablet TK 1 T PO QAM  1  . Multiple Vitamin (MULTIVITAMIN WITH MINERALS) TABS tablet Take 1 tablet by mouth daily after supper.    Marland Kitchen PRESCRIPTION MEDICATION Apply 1 mL topically daily. Testosterone Lipoderm 20% compounded at [place] - apply to stomach or chest    . rosuvastatin (CRESTOR) 5 MG tablet Take 5 mg by mouth daily.    Marland Kitchen tiotropium (SPIRIVA HANDIHALER) 18 MCG inhalation capsule      No current facility-administered medications on file prior to visit.     Allergies:  No Known Allergies  Physical Exam General: well  developed, well nourished pleasant middle-aged Caucasian male not in distress, seated, Head: head normocephalic and atraumatic.   Neck: supple with no carotid or supraclavicular bruits Cardiovascular: regular rate and rhythm, no murmurs Musculoskeletal: no deformity Skin:  no rash/petichiae Vascular:  Normal pulses all extremities  Neurologic Exam Mental Status: Awake and fully alert. Oriented to place and time. Recent and remote memory intact. Attention span, concentration and fund of knowledge appropriate. Mood and affect appropriate.  Cranial Nerves: Fundoscopic exam not done Pupils equal, briskly reactive to light. Extraocular movements full without  nystagmus. Visual fields full to confrontation. Hearing intact. Facial sensation intact. Face, tongue, palate moves normally and symmetrically.  Motor: Normal bulk and tone. Normal strength in all tested extremity muscles. Sensory.: intact to touch , pinprick , position and vibratory sensation.  Coordination: Rapid alternating movements normal in all extremities. Finger-to-nose and heel-to-shin performed accurately bilaterally. Gait and Station: Arises from chair without difficulty. Stance is normal. Gait demonstrates normal stride length and balance . Able to heel, toe and tandem walk with  difficulty.  Reflexes: 2+ and asymmetric and brisker on the right.. Toes downgoing.      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  Clinical exam is nonfocal except slight hyperreflexia on the right side.  Etiology likely small vessel disease.  Vascular risk factors of hypertension and hyperlipidemia.  Returns today for follow-up visit and overall is doing well.     PLAN: -Continue clopidogrel 75 mg daily  and Crestor 5 mg for secondary stroke prevention -F/u with PCP regarding your HLD and HTN  management -continue to monitor BP at home -Advised to continue to stay active and maintain a healthy diet -Maintain strict control of hypertension with blood pressure goal below 130/90, diabetes with hemoglobin A1c goal below 6.5% and cholesterol with LDL cholesterol (bad cholesterol) goal below 70 mg/dL. I also advised the patient to eat a healthy diet with plenty of whole grains, cereals, fruits and vegetables, exercise regularly and maintain ideal body weight.  Follow up in as needed as requested by the patient as he is been stable from stroke standpoint.  He was advised to call office with questions, concerns or need of future follow-up appointment.  Greater than 50% time during this 30-minute  visit was spent counseling and coordination of care about his TIA, risk for recurrent stroke, stroke risk stratification and management discussion and answering questions.  Venancio Poisson, AGNP-BC  [facility] [address] [place], [place] [address]  Phone [phone] Fax [phone] Note: This document was prepared with digital dictation and possible smart phrase technology. Any transcriptional errors that result from this process are unintentional.

## 2017-10-02 NOTE — Progress Notes (Signed)
I agree with the above plan 

## 2018-07-27 ENCOUNTER — Other Ambulatory Visit: Payer: Self-pay | Admitting: Family Medicine

## 2018-07-27 DIAGNOSIS — F17200 Nicotine dependence, unspecified, uncomplicated: Secondary | ICD-10-CM

## 2018-08-04 ENCOUNTER — Other Ambulatory Visit: Payer: Self-pay

## 2018-08-04 ENCOUNTER — Ambulatory Visit
Admission: RE | Admit: 2018-08-04 | Discharge: 2018-08-04 | Disposition: A | Discharge status: Home / Self Care | Payer: Medicare Other | Source: Ambulatory Visit | Attending: Family Medicine | Admitting: Family Medicine

## 2018-08-04 DIAGNOSIS — F17200 Nicotine dependence, unspecified, uncomplicated: Secondary | ICD-10-CM

## 2019-01-12 ENCOUNTER — Other Ambulatory Visit: Payer: Self-pay | Admitting: Family Medicine

## 2019-01-12 ENCOUNTER — Ambulatory Visit
Admission: RE | Admit: 2019-01-12 | Discharge: 2019-01-12 | Disposition: A | Discharge status: Home / Self Care | Payer: Medicare Other | Source: Ambulatory Visit | Attending: Family Medicine | Admitting: Family Medicine

## 2019-01-12 DIAGNOSIS — D649 Anemia, unspecified: Secondary | ICD-10-CM

## 2019-01-12 DIAGNOSIS — R0602 Shortness of breath: Secondary | ICD-10-CM

## 2019-02-01 ENCOUNTER — Ambulatory Visit: Payer: Medicare Other | Attending: Internal Medicine

## 2019-02-01 DIAGNOSIS — Z20822 Contact with and (suspected) exposure to covid-19: Secondary | ICD-10-CM

## 2019-02-02 LAB — NOVEL CORONAVIRUS, NAA: SARS-CoV-2, NAA: NOT DETECTED

## 2019-02-04 ENCOUNTER — Ambulatory Visit: Payer: Medicare Other | Attending: Internal Medicine

## 2019-02-04 DIAGNOSIS — Z23 Encounter for immunization: Secondary | ICD-10-CM | POA: Insufficient documentation

## 2019-02-04 NOTE — Progress Notes (Signed)
   Covid-19 Vaccination Clinic  Name:  KAREEN HITSMAN    MRN: [id number] DOB: Sep 15, 1945  02/04/2019  Mr. Hidalgo was observed post Covid-19 immunization for 30 minutes based on pre-vaccination screening without incidence. He was provided with Vaccine Information Sheet and instruction to access the V-Safe system.   Mr. Blagg was instructed to call 911 with any severe reactions post vaccine: Marland Kitchen Difficulty breathing  . Swelling of your face and throat  . A fast heartbeat  . A bad rash all over your body  . Dizziness and weakness    Immunizations Administered    Name Date Dose VIS Date Route   Pfizer COVID-19 Vaccine 02/04/2019 10:06 AM 0.3 mL 12/24/2018 Intramuscular   Manufacturer: [place]   Lot: [id number]   [place]: [address]-4

## 2019-02-25 ENCOUNTER — Ambulatory Visit: Payer: Medicare Other | Attending: Internal Medicine

## 2019-02-25 DIAGNOSIS — Z23 Encounter for immunization: Secondary | ICD-10-CM | POA: Insufficient documentation

## 2019-02-25 NOTE — Progress Notes (Signed)
   Covid-19 Vaccination Clinic  Name:  Kevin Brooks    MRN: [id number] DOB: 11/13/1945  02/25/2019  [name] was observed post Covid-19 immunization for 15 minutes without incidence. He was provided with Vaccine Information Sheet and instruction to access the V-Safe system.   [name] was instructed to call 911 with any severe reactions post vaccine: Marland Kitchen Difficulty breathing  . Swelling of your face and throat  . A fast heartbeat  . A bad rash all over your body  . Dizziness and weakness    Immunizations Administered    Name Date Dose VIS Date Route   Pfizer COVID-19 Vaccine 02/25/2019 11:38 AM 0.3 mL 12/24/2018 Intramuscular   Manufacturer: [place]   Lot: [id number]   [place]: [address]-8

## 2019-04-04 ENCOUNTER — Encounter: Payer: Self-pay | Admitting: Podiatry

## 2019-04-04 ENCOUNTER — Ambulatory Visit: Payer: Medicare Other | Admitting: Podiatry

## 2019-04-04 ENCOUNTER — Other Ambulatory Visit: Payer: Self-pay

## 2019-04-04 ENCOUNTER — Other Ambulatory Visit: Payer: Self-pay | Admitting: Podiatry

## 2019-04-04 ENCOUNTER — Ambulatory Visit (INDEPENDENT_AMBULATORY_CARE_PROVIDER_SITE_OTHER): Payer: Medicare Other

## 2019-04-04 VITALS — Temp 97.7°F

## 2019-04-04 DIAGNOSIS — M779 Enthesopathy, unspecified: Secondary | ICD-10-CM

## 2019-04-04 DIAGNOSIS — M7742 Metatarsalgia, left foot: Secondary | ICD-10-CM

## 2019-04-04 DIAGNOSIS — M79671 Pain in right foot: Secondary | ICD-10-CM

## 2019-04-04 DIAGNOSIS — M7741 Metatarsalgia, right foot: Secondary | ICD-10-CM

## 2019-04-04 DIAGNOSIS — M79672 Pain in left foot: Secondary | ICD-10-CM

## 2019-04-04 NOTE — Progress Notes (Signed)
Subjective:   Patient ID: Kevin Brooks, male   DOB: 74 y.o.   MRN: [id number]   HPI Patient presents stating bilateral ball of foot arch area and has been diagnosed with metatarsalgia and has had orthotics in the past but they have worn out and he cannot wear them like he did.  Patient smokes quarter pack per day and does like to be active   Review of Systems  All other systems reviewed and are negative.       Objective:  Physical Exam Vitals and nursing note reviewed.  Constitutional:      Appearance: He is well-developed.  Pulmonary:     Effort: Pulmonary effort is normal.  Musculoskeletal:        General: Normal range of motion.  Skin:    General: Skin is warm.  Neurological:     Mental Status: He is alert.     Neurovascular status intact muscle strength was found to be adequate range of motion within normal limits.  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

## 2019-04-04 NOTE — Progress Notes (Signed)
hhhhhh

## 2019-04-25 ENCOUNTER — Ambulatory Visit: Payer: Medicare Other | Admitting: Orthotics

## 2019-04-25 ENCOUNTER — Other Ambulatory Visit: Payer: Self-pay

## 2019-04-25 DIAGNOSIS — M79672 Pain in left foot: Secondary | ICD-10-CM

## 2019-04-25 DIAGNOSIS — M79671 Pain in right foot: Secondary | ICD-10-CM

## 2019-04-25 NOTE — Progress Notes (Signed)

## 2019-05-24 ENCOUNTER — Telehealth: Payer: Self-pay

## 2019-05-24 NOTE — Telephone Encounter (Signed)
Dr. Leonie Man reviewed chart for pt no recent TIA or strokes in the last 6 months. Clearance form completed and fax to Dr Richmond Campbell at [phone]. Fax and confirmed.

## 2019-07-22 ENCOUNTER — Other Ambulatory Visit (HOSPITAL_COMMUNITY): Payer: Self-pay

## 2019-07-25 ENCOUNTER — Encounter (HOSPITAL_COMMUNITY)
Admission: RE | Admit: 2019-07-25 | Discharge: 2019-07-25 | Disposition: A | Discharge status: Home / Self Care | Payer: Medicare Other | Source: Ambulatory Visit | Attending: Gastroenterology | Admitting: Gastroenterology

## 2019-07-25 ENCOUNTER — Other Ambulatory Visit: Payer: Self-pay

## 2019-07-25 DIAGNOSIS — D509 Iron deficiency anemia, unspecified: Secondary | ICD-10-CM | POA: Diagnosis present

## 2019-07-25 DIAGNOSIS — K5520 Angiodysplasia of colon without hemorrhage: Secondary | ICD-10-CM | POA: Insufficient documentation

## 2019-07-25 MED ORDER — SODIUM CHLORIDE 0.9 % IV SOLN
750.0000 mg | Freq: Once | INTRAVENOUS | Status: AC
  Administered 2019-07-25: 750 mg via INTRAVENOUS
  Filled 2019-07-25: qty 15

## 2019-07-25 NOTE — Discharge Instructions (Signed)

## 2019-08-02 ENCOUNTER — Encounter (HOSPITAL_COMMUNITY): Payer: Medicare Other

## 2019-08-02 ENCOUNTER — Other Ambulatory Visit: Payer: Self-pay

## 2019-08-02 ENCOUNTER — Encounter (HOSPITAL_COMMUNITY)
Admission: RE | Admit: 2019-08-02 | Discharge: 2019-08-02 | Disposition: A | Discharge status: Home / Self Care | Payer: Medicare Other | Source: Ambulatory Visit | Attending: Internal Medicine | Admitting: Internal Medicine

## 2019-08-02 DIAGNOSIS — D509 Iron deficiency anemia, unspecified: Secondary | ICD-10-CM | POA: Diagnosis not present

## 2019-08-02 MED ORDER — SODIUM CHLORIDE 0.9 % IV SOLN
750.0000 mg | Freq: Once | INTRAVENOUS | Status: AC
  Administered 2019-08-02: 750 mg via INTRAVENOUS
  Filled 2019-08-02: qty 15

## 2019-08-11 ENCOUNTER — Other Ambulatory Visit: Payer: Self-pay | Admitting: Family Medicine

## 2019-08-11 DIAGNOSIS — F17200 Nicotine dependence, unspecified, uncomplicated: Secondary | ICD-10-CM

## 2019-08-24 ENCOUNTER — Ambulatory Visit
Admission: RE | Admit: 2019-08-24 | Discharge: 2019-08-24 | Disposition: A | Discharge status: Home / Self Care | Payer: Medicare Other | Source: Ambulatory Visit | Attending: Family Medicine | Admitting: Family Medicine

## 2019-08-24 ENCOUNTER — Other Ambulatory Visit: Payer: Self-pay

## 2019-08-24 DIAGNOSIS — F17200 Nicotine dependence, unspecified, uncomplicated: Secondary | ICD-10-CM

## 2019-12-28 ENCOUNTER — Other Ambulatory Visit: Payer: Self-pay | Admitting: Gastroenterology

## 2020-01-19 ENCOUNTER — Telehealth: Payer: Self-pay | Admitting: Adult Health

## 2020-01-19 NOTE — Telephone Encounter (Signed)
He should call GI to further discuss duration of holding Plavix prior to colonoscopy

## 2020-01-19 NOTE — Telephone Encounter (Signed)
Pt called, Dr. Benson Norway wanting to know how many days to stop Plavix before colonoscopy on 1/21. Would like a call from the nurse.

## 2020-01-20 NOTE — Telephone Encounter (Signed)
I spoke to the patient and he is agreeable to this plan.

## 2020-03-09 ENCOUNTER — Encounter (HOSPITAL_COMMUNITY): Payer: Self-pay | Admitting: Gastroenterology

## 2020-03-09 ENCOUNTER — Other Ambulatory Visit: Payer: Self-pay

## 2020-03-13 ENCOUNTER — Other Ambulatory Visit (HOSPITAL_COMMUNITY)
Admission: RE | Admit: 2020-03-13 | Discharge: 2020-03-13 | Disposition: A | Discharge status: Home / Self Care | Payer: Medicare Other | Source: Ambulatory Visit | Attending: Gastroenterology | Admitting: Gastroenterology

## 2020-03-13 DIAGNOSIS — Z01812 Encounter for preprocedural laboratory examination: Secondary | ICD-10-CM | POA: Insufficient documentation

## 2020-03-13 DIAGNOSIS — Z20822 Contact with and (suspected) exposure to covid-19: Secondary | ICD-10-CM | POA: Insufficient documentation

## 2020-03-14 LAB — SARS CORONAVIRUS 2 (TAT 6-24 HRS): SARS Coronavirus 2: NEGATIVE

## 2020-03-16 ENCOUNTER — Other Ambulatory Visit: Payer: Self-pay

## 2020-03-16 ENCOUNTER — Ambulatory Visit (HOSPITAL_COMMUNITY): Payer: Medicare Other | Admitting: Certified Registered Nurse Anesthetist

## 2020-03-16 ENCOUNTER — Encounter (HOSPITAL_COMMUNITY): Payer: Self-pay | Admitting: Gastroenterology

## 2020-03-16 ENCOUNTER — Ambulatory Visit (HOSPITAL_COMMUNITY)
Admission: RE | Admit: 2020-03-16 | Discharge: 2020-03-16 | Disposition: A | Discharge status: Home / Self Care | Payer: Medicare Other | Attending: Gastroenterology | Admitting: Gastroenterology

## 2020-03-16 ENCOUNTER — Encounter (HOSPITAL_COMMUNITY)
Admission: RE | Disposition: A | Discharge status: Home / Self Care | Payer: Self-pay | Source: Home / Self Care | Attending: Gastroenterology

## 2020-03-16 DIAGNOSIS — F17210 Nicotine dependence, cigarettes, uncomplicated: Secondary | ICD-10-CM | POA: Diagnosis not present

## 2020-03-16 DIAGNOSIS — K5520 Angiodysplasia of colon without hemorrhage: Secondary | ICD-10-CM | POA: Insufficient documentation

## 2020-03-16 DIAGNOSIS — Z8673 Personal history of transient ischemic attack (TIA), and cerebral infarction without residual deficits: Secondary | ICD-10-CM | POA: Insufficient documentation

## 2020-03-16 DIAGNOSIS — T182XXA Foreign body in stomach, initial encounter: Secondary | ICD-10-CM | POA: Diagnosis not present

## 2020-03-16 DIAGNOSIS — D122 Benign neoplasm of ascending colon: Secondary | ICD-10-CM | POA: Diagnosis not present

## 2020-03-16 DIAGNOSIS — K5730 Diverticulosis of large intestine without perforation or abscess without bleeding: Secondary | ICD-10-CM | POA: Diagnosis not present

## 2020-03-16 DIAGNOSIS — D509 Iron deficiency anemia, unspecified: Secondary | ICD-10-CM | POA: Insufficient documentation

## 2020-03-16 DIAGNOSIS — X58XXXA Exposure to other specified factors, initial encounter: Secondary | ICD-10-CM | POA: Insufficient documentation

## 2020-03-16 DIAGNOSIS — Z79899 Other long term (current) drug therapy: Secondary | ICD-10-CM | POA: Diagnosis not present

## 2020-03-16 HISTORY — DX: Gastro-esophageal reflux disease without esophagitis: K21.9

## 2020-03-16 HISTORY — PX: COLONOSCOPY WITH PROPOFOL: SHX5780

## 2020-03-16 HISTORY — DX: Transient cerebral ischemic attack, unspecified: G45.9

## 2020-03-16 HISTORY — PX: HEMOSTASIS CLIP PLACEMENT: SHX6857

## 2020-03-16 HISTORY — PX: ENTEROSCOPY: SHX5533

## 2020-03-16 HISTORY — PX: POLYPECTOMY: SHX5525

## 2020-03-16 HISTORY — PX: HOT HEMOSTASIS: SHX5433

## 2020-03-16 SURGERY — ENTEROSCOPY
Anesthesia: Monitor Anesthesia Care

## 2020-03-16 MED ORDER — SODIUM CHLORIDE 0.9 % IV SOLN: INTRAVENOUS | Status: DC

## 2020-03-16 MED ORDER — LACTATED RINGERS IV SOLN: INTRAVENOUS | Status: DC

## 2020-03-16 MED ORDER — LIDOCAINE 2% (20 MG/ML) 5 ML SYRINGE
INTRAMUSCULAR | Status: DC | PRN
  Administered 2020-03-16: 60 mg via INTRAVENOUS

## 2020-03-16 MED ORDER — PROPOFOL 10 MG/ML IV BOLUS
INTRAVENOUS | Status: DC | PRN
Start: 2020-03-16 — End: 2020-03-16
  Administered 2020-03-16: 20 mg via INTRAVENOUS
  Administered 2020-03-16: 30 mg via INTRAVENOUS

## 2020-03-16 MED ORDER — PROPOFOL 500 MG/50ML IV EMUL
INTRAVENOUS | Status: DC | PRN
  Administered 2020-03-16: 100 ug/kg/min via INTRAVENOUS

## 2020-03-16 SURGICAL SUPPLY — 21 items

## 2020-03-16 NOTE — Anesthesia Preprocedure Evaluation (Addendum)
Anesthesia Evaluation  Patient identified by MRN, date of birth, ID band Patient awake    Reviewed: Allergy & Precautions, NPO status , Patient's Chart, lab work & pertinent test results  History of Anesthesia Complications Negative for: history of anesthetic complications  Airway Mallampati: II  TM Distance: >3 FB Neck ROM: Full    Dental  (+) Dental Advisory Given   Pulmonary COPD,  COPD inhaler, Current Smoker and Patient abstained from smoking.,    Pulmonary exam normal        Cardiovascular hypertension, Pt. on medications Normal cardiovascular exam     Neuro/Psych PSYCHIATRIC DISORDERS Depression TIA   GI/Hepatic Neg liver ROS, GERD  Medicated and Controlled,  Endo/Other  negative endocrine ROS  Renal/GU negative Renal ROS     Musculoskeletal negative musculoskeletal ROS (+)   Abdominal   Peds  Hematology  On plavix    Anesthesia Other Findings Covid test negative   Reproductive/Obstetrics                            Anesthesia Physical Anesthesia Plan  ASA: III  Anesthesia Plan: MAC   Post-op Pain Management:    Induction: Intravenous  PONV Risk Score and Plan: 2 and Propofol infusion and Treatment may vary due to age or medical condition  Airway Management Planned: Nasal Cannula and Natural Airway  Additional Equipment: None  Intra-op Plan:   Post-operative Plan:   Informed Consent: I have reviewed the patients History and Physical, chart, labs and discussed the procedure including the risks, benefits and alternatives for the proposed anesthesia with the patient or authorized representative who has indicated his/her understanding and acceptance.       Plan Discussed with: CRNA and Anesthesiologist  Anesthesia Plan Comments:        Anesthesia Quick Evaluation

## 2020-03-16 NOTE — Discharge Instructions (Signed)

## 2020-03-16 NOTE — Transfer of Care (Signed)
Immediate Anesthesia Transfer of Care Note  Patient: Kevin Brooks  Procedure(s) Performed: ENTEROSCOPY (N/A ) COLONOSCOPY WITH PROPOFOL (N/A ) POLYPECTOMY  Patient Location: PACU and Endoscopy Unit  Anesthesia Type:MAC  Level of Consciousness: awake, drowsy and patient cooperative  Airway & Oxygen Therapy: Patient Spontanous Breathing and Patient connected to face mask oxygen  Post-op Assessment: Report given to RN and Post -op Vital signs reviewed and stable  Post vital signs: Reviewed and stable  Last Vitals:  Vitals Value Taken Time  BP    Temp    Pulse 74 03/16/20 1013  Resp 16 03/16/20 1013  SpO2 100 % 03/16/20 1013  Vitals shown include unvalidated device data.  Last Pain:  Vitals:   03/16/20 0847  TempSrc: Oral  PainSc: 0-No pain         Complications: No complications documented.

## 2020-03-16 NOTE — Anesthesia Procedure Notes (Addendum)
Procedure Name: MAC Date/Time: 03/16/2020 9:28 AM Performed by: West Pugh, CRNA Pre-anesthesia Checklist: Patient identified, Emergency Drugs available, Suction available, Patient being monitored and Timeout performed Patient Re-evaluated:Patient Re-evaluated prior to induction Oxygen Delivery Method: Simple face mask Preoxygenation: Pre-oxygenation with 100% oxygen Induction Type: IV induction Placement Confirmation: positive ETCO2 Dental Injury: Teeth and Oropharynx as per pre-operative assessment

## 2020-03-16 NOTE — Anesthesia Postprocedure Evaluation (Signed)
Anesthesia Post Note  Patient: Kevin Brooks  Procedure(s) Performed: ENTEROSCOPY (N/A ) COLONOSCOPY WITH PROPOFOL (N/A ) POLYPECTOMY     Patient location during evaluation: PACU Anesthesia Type: MAC Level of consciousness: awake and alert Pain management: pain level controlled Vital Signs Assessment: post-procedure vital signs reviewed and stable Respiratory status: spontaneous breathing, nonlabored ventilation and respiratory function stable Cardiovascular status: stable and blood pressure returned to baseline Anesthetic complications: no   No complications documented.  Last Vitals:  Vitals:   03/16/20 1020 03/16/20 1030  BP: (!) 142/80 (!) 158/82  Pulse: 75 67  Resp: (!) 22 15  Temp:    SpO2: 98% 98%    Last Pain:  Vitals:   03/16/20 1030  TempSrc:   PainSc: 0-No pain                 Audry Pili

## 2020-03-16 NOTE — H&P (Signed)
  Kevin Brooks   HPI: The patient's most recent HGB with Dr. Sandi Brooks on 10/10/2019 was at 14.1 g/dL. This value is in range from the HGB that was obtained in the office in August this year. Recently he reported having a few bouts of hematochezia and he was hemoccult positive. There is a long history of intermittent IDA and the last colonoscopy with Dr. Earlean Brooks was on 12/2016 with findings of diverticula. Since the onset of his bleeding he denies any further bleeding.    Past Medical History:  Diagnosis Date  . BPH (benign prostatic hyperplasia)   . Cataracta   . COPD (chronic obstructive pulmonary disease) ([place])   . Depression   . ED (erectile dysfunction)   . GERD (gastroesophageal reflux disease)   . Hypertension   . Hypogonadism male   . Mixed hyperlipidemia   . Palpitations   . TIA (transient ischemic attack)   . Vision abnormalities     Past Surgical History:  Procedure Laterality Date  .  catarcts surgery    . COLONOSCOPY      Family History  Problem Relation Age of Onset  . Stroke Father     Social History:  reports that he has been smoking cigarettes. He has been smoking about 0.25 packs per day. He has never used smokeless tobacco. He reports current alcohol use of about 1.0 standard drink of alcohol per week. He reports that he does not use drugs.  Allergies: No Known Allergies  Medications:  Scheduled:  Continuous: . lactated ringers 10 mL/hr at 03/16/20 0854    No results found for this or any previous visit (from the past 24 hour(s)).   No results found.  ROS:  As stated above in the HPI otherwise negative.  Blood pressure (!) 155/75, temperature 98.6 F (37 C), temperature source Oral, resp. rate 17, height 5\' 7"  (1.702 m), weight 59.9 kg, SpO2 100 %.    PE: Gen: NAD, Alert and Oriented HEENT:  [place]/AT, EOMI Neck: Supple, no LAD Lungs: CTA Bilaterally CV: RRR without M/G/R ABD: Soft, NTND, +BS Ext: No C/C/E  Assessment/Plan: 1) IDA -  EGD/colon.  Kevin Brooks D 03/16/2020, 9:21 AM

## 2020-03-16 NOTE — Op Note (Signed)
[facility] Patient Name: Kevin Brooks Procedure Date: 03/16/2020 MRN: [id number] Attending MD: Carol Ada , MD Date of Birth: 1945/06/23 CSN: [id number] Age: 75 Admit Type: Outpatient Procedure:                Small bowel enteroscopy Indications:              Iron deficiency anemia Providers:                Carol Ada, MD, Nelia Shi, RN, Ladona Ridgel, Technician, Christell Faith, CRNA Referring MD:              Medicines:                Propofol per Anesthesia Complications:            No immediate complications. Estimated Blood Loss:     Estimated blood loss: none. Procedure:                Pre-Anesthesia Assessment:                           - Prior to the procedure, a History and Physical                            was performed, and patient medications and                            allergies were reviewed. The patient's tolerance of                            previous anesthesia was also reviewed. The risks                            and benefits of the procedure and the sedation                            options and risks were discussed with the patient.                            All questions were answered, and informed consent                            was obtained. Prior Anticoagulants: The patient has                            taken no previous anticoagulant or antiplatelet                            agents. ASA Grade Assessment: III - A patient with                            severe systemic disease. After reviewing the risks  and benefits, the patient was deemed in                            satisfactory condition to undergo the procedure.                           - Sedation was administered by an anesthesia                            professional. Deep sedation was attained.                           After obtaining informed consent, the endoscope was                            passed  under direct vision. Throughout the                            procedure, the patient's blood pressure, pulse, and                            oxygen saturations were monitored continuously. The                            PCF-H190DL ([id number] Olympus pediatric colonscope                            was introduced through the mouth and advanced to                            the small bowel distal to the Ligament of Treitz.                            The small bowel enteroscopy was accomplished                            without difficulty. The patient tolerated the                            procedure well. Scope In: Scope Out: Findings:      The esophagus was normal.      An endoclip was found in the gastric body.      The examined duodenum was normal.      There was no evidence of significant pathology in the entire examined       portion of jejunum.      Four hemoclips were noted in the gastric lumen from prior interventions. Impression:               - Normal esophagus.                           - An endoclip was found in the stomach.                           - Normal examined duodenum.                           -  The examined portion of the jejunum was normal.                           - No specimens collected. Recommendation:           - Proceed with the colonoscopy. Procedure Code(s):        --- Professional ---                           [phone], Small intestinal endoscopy, enteroscopy                            beyond second portion of duodenum, not including                            ileum; diagnostic, including collection of                            specimen(s) by brushing or washing, when performed                            (separate procedure) Diagnosis Code(s):        --- Professional ---                           D50.9, Iron deficiency anemia, unspecified                           T18.2XXA, Foreign body in stomach, initial encounter CPT copyright 2019 American Medical  Association. All rights reserved. The codes documented in this report are preliminary and upon coder review may  be revised to meet current compliance requirements. Carol Ada, MD Carol Ada, MD 03/16/2020 10:20:38 AM This report has been signed electronically. Number of Addenda: 0

## 2020-03-16 NOTE — Op Note (Signed)
[facility] Patient Name: Kevin Brooks Procedure Date: 03/16/2020 MRN: [id number] Attending MD: Carol Ada , MD Date of Birth: 1945-11-19 CSN: [id number] Age: 75 Admit Type: Outpatient Procedure:                Colonoscopy Indications:              Iron deficiency anemia Providers:                Carol Ada, MD, Nelia Shi, RN, Ladona Ridgel, Technician, Christell Faith, CRNA Referring MD:              Medicines:                Propofol per Anesthesia Complications:            No immediate complications. Estimated Blood Loss:     Estimated blood loss: none. Procedure:                Pre-Anesthesia Assessment:                           - Prior to the procedure, a History and Physical                            was performed, and patient medications and                            allergies were reviewed. The patient's tolerance of                            previous anesthesia was also reviewed. The risks                            and benefits of the procedure and the sedation                            options and risks were discussed with the patient.                            All questions were answered, and informed consent                            was obtained. Prior Anticoagulants: The patient has                            taken no previous anticoagulant or antiplatelet                            agents. ASA Grade Assessment: III - A patient with                            severe systemic disease. After reviewing the risks  and benefits, the patient was deemed in                            satisfactory condition to undergo the procedure.                           - Sedation was administered by an anesthesia                            professional. Deep sedation was attained.                           After obtaining informed consent, the colonoscope                            was passed under  direct vision. Throughout the                            procedure, the patient's blood pressure, pulse, and                            oxygen saturations were monitored continuously. The                            PCF-H190DL ([id number]) Olympus pediatric colonscope                            was introduced through the anus and advanced to the                            the cecum, identified by appendiceal orifice and                            ileocecal valve. The colonoscopy was performed                            without difficulty. The patient tolerated the                            procedure well. The quality of the bowel                            preparation was excellent. The ileocecal valve,                            appendiceal orifice, and rectum were photographed. Scope In: 9:44:47 AM Scope Out: 10:05:05 AM Scope Withdrawal Time: 0 hours 14 minutes 53 seconds  Total Procedure Duration: 0 hours 20 minutes 18 seconds  Findings:      A 5 mm polyp was found in the ascending colon. The polyp was sessile.       The polyp was removed with a cold snare. Resection and retrieval were       complete.      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 Coagulation for tissue destruction using       monopolar probe was successful. To prevent bleeding post-intervention,       four hemostatic clips were successfully placed (MR conditional). There       was no bleeding at the end of the procedure.      Scattered small and large-mouthed diverticula were found in the sigmoid       colon.      In the ascending colon there was evidence of two large nonbleeding AVMs.       Adjacent to this area was a flatter and more subtle polyp. The polyp was       removed first with a cold snare and then the two AVMs were ablated with       APC. A total of four hemoclips were deployed to prevent post APC       bleeding. Impression:               - One 5  mm polyp in the ascending colon, removed                            with a cold snare. Resected and retrieved.                           - Two non-bleeding colonic angiodysplastic lesions.                            Treated with a monopolar probe. Clips (MR                            conditional) were placed.                           - Diverticulosis in the sigmoid colon. Moderate Sedation:      Not Applicable - Patient had care per Anesthesia. Recommendation:           - Patient has a contact number available for                            emergencies. The signs and symptoms of potential                            delayed complications were discussed with the                            patient. Return to normal activities tomorrow.                            Written discharge instructions were provided to the                            patient.                           - Resume previous diet.                           - Continue present  medications.                           - Await pathology results.                           - Repeat colonoscopy in 7 years for surveillance,                            if clinically appropriate.                           - Follow up in the office in one month. Procedure Code(s):        --- Professional ---                           [phone], Colonoscopy, flexible; with ablation of                            tumor(s), polyp(s), or other lesion(s) (includes                            pre- and post-dilation and guide wire passage, when                            performed)                           [id number], 59, Colonoscopy, flexible; with removal of                            tumor(s), polyp(s), or other lesion(s) by snare                            technique Diagnosis Code(s):        --- Professional ---                           K63.5, Polyp of colon                           K55.20, Angiodysplasia of colon without hemorrhage                            D50.9, Iron deficiency anemia, unspecified                           K57.30, Diverticulosis of large intestine without                            perforation or abscess without bleeding CPT copyright 2019 American Medical Association. All rights reserved. The codes documented in this report are preliminary and upon coder review may  be revised to meet current compliance requirements. Carol Ada, MD Carol Ada, MD 03/16/2020 10:17:38 AM This report has been signed electronically. Number of Addenda: 0

## 2020-03-19 ENCOUNTER — Encounter (HOSPITAL_COMMUNITY): Payer: Self-pay | Admitting: Gastroenterology

## 2020-03-19 LAB — SURGICAL PATHOLOGY

## 2020-07-10 ENCOUNTER — Other Ambulatory Visit: Payer: Self-pay | Admitting: Family Medicine

## 2020-07-10 DIAGNOSIS — Z122 Encounter for screening for malignant neoplasm of respiratory organs: Secondary | ICD-10-CM

## 2020-07-26 ENCOUNTER — Ambulatory Visit: Payer: Medicare Other

## 2020-09-04 ENCOUNTER — Ambulatory Visit
Admission: RE | Admit: 2020-09-04 | Discharge: 2020-09-04 | Disposition: A | Discharge status: Home / Self Care | Payer: Medicare Other | Source: Ambulatory Visit | Attending: Family Medicine | Admitting: Family Medicine

## 2020-09-04 ENCOUNTER — Other Ambulatory Visit: Payer: Self-pay

## 2020-09-04 DIAGNOSIS — Z122 Encounter for screening for malignant neoplasm of respiratory organs: Secondary | ICD-10-CM

## 2020-09-10 ENCOUNTER — Other Ambulatory Visit: Payer: Self-pay | Admitting: Acute Care

## 2020-09-10 DIAGNOSIS — R911 Solitary pulmonary nodule: Secondary | ICD-10-CM

## 2020-09-10 NOTE — Progress Notes (Signed)
I have called the patient and discussed the plan for PET scan and PFT's to better evaluate the Lung RADS 4 B , which indicates suspicious findings for which additional diagnostic testing and or tissue sampling is recommended. Dr. Sandi Mariscal had called the patient and explained the scan results to him prior to my call. He understands that he will be called to schedule both PFT's and PET scan. We will have him follow up with Dr. Valeta Harms or Lamonte Sakai once PET and PFT's have been done to determine best plan of care moving forward.  Langley Gauss, we will need to keep Dr. Sandi Mariscal abreast of all results and plans. We will have to do this via phone or fax as he is not part of the [facility] system. Thanks so much

## 2020-09-13 ENCOUNTER — Ambulatory Visit (INDEPENDENT_AMBULATORY_CARE_PROVIDER_SITE_OTHER): Payer: Medicare Other | Admitting: Internal Medicine

## 2020-09-13 ENCOUNTER — Other Ambulatory Visit: Payer: Self-pay

## 2020-09-13 DIAGNOSIS — J449 Chronic obstructive pulmonary disease, unspecified: Secondary | ICD-10-CM

## 2020-09-13 DIAGNOSIS — R911 Solitary pulmonary nodule: Secondary | ICD-10-CM

## 2020-09-13 LAB — PULMONARY FUNCTION TEST
DL/VA % pred: 57 %
DL/VA: 2.33 ml/min/mmHg/L
DLCO cor % pred: 60 %
DLCO cor: 14.46 ml/min/mmHg
DLCO unc % pred: 60 %
DLCO unc: 14.46 ml/min/mmHg
FEF 25-75 Post: 0.86 L/sec
FEF 25-75 Pre: 0.56 L/sec
FEF2575-%Change-Post: 53 %
FEF2575-%Pred-Post: 40 %
FEF2575-%Pred-Pre: 26 %
FEV1-%Change-Post: 12 %
FEV1-%Pred-Post: 55 %
FEV1-%Pred-Pre: 49 %
FEV1-Post: 1.6 L
FEV1-Pre: 1.42 L
FEV1FVC-%Change-Post: -3 %
FEV1FVC-%Pred-Pre: 68 %
FEV6-%Change-Post: 16 %
FEV6-%Pred-Post: 88 %
FEV6-%Pred-Pre: 75 %
FEV6-Post: 3.29 L
FEV6-Pre: 2.83 L
FEV6FVC-%Change-Post: -1 %
FEV6FVC-%Pred-Post: 104 %
FEV6FVC-%Pred-Pre: 106 %
FVC-%Change-Post: 17 %
FVC-%Pred-Post: 84 %
FVC-%Pred-Pre: 71 %
FVC-Post: 3.35 L
FVC-Pre: 2.85 L
Post FEV1/FVC ratio: 48 %
Post FEV6/FVC ratio: 98 %
Pre FEV1/FVC ratio: 50 %
Pre FEV6/FVC Ratio: 100 %
RV % pred: 191 %
RV: 4.59 L
TLC % pred: 123 %
TLC: 8.2 L

## 2020-09-13 NOTE — Progress Notes (Signed)
Full PFT performed today. °

## 2020-09-26 ENCOUNTER — Other Ambulatory Visit: Payer: Self-pay

## 2020-09-26 ENCOUNTER — Ambulatory Visit (HOSPITAL_COMMUNITY)
Admission: RE | Admit: 2020-09-26 | Discharge: 2020-09-26 | Disposition: A | Discharge status: Home / Self Care | Payer: Medicare Other | Source: Ambulatory Visit | Attending: Acute Care | Admitting: Acute Care

## 2020-09-26 DIAGNOSIS — R911 Solitary pulmonary nodule: Secondary | ICD-10-CM

## 2020-09-26 DIAGNOSIS — R7309 Other abnormal glucose: Secondary | ICD-10-CM | POA: Diagnosis not present

## 2020-09-26 LAB — GLUCOSE, CAPILLARY: Glucose-Capillary: 108 mg/dL — ABNORMAL HIGH (ref 70–99)

## 2020-09-26 MED ORDER — FLUDEOXYGLUCOSE F - 18 (FDG) INJECTION
6.3800 | Freq: Once | INTRAVENOUS | Status: AC
  Administered 2020-09-26: 6.38 via INTRAVENOUS

## 2020-09-27 ENCOUNTER — Telehealth: Payer: Self-pay | Admitting: Internal Medicine

## 2020-09-27 NOTE — Telephone Encounter (Signed)
Call report from [place] on CT   IMPRESSION: Subsolid nodule of the right lower lobe. 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   Splenic lesion with hypermetabolic activity which on retrospect appears to have increased in size when compared with prior CTs, metastasis to the spleen would be unusual and finding is concerning for additional malignancy such as lymphoma. Recommend further evaluation with tissue sampling.   Aortic Atherosclerosis (ICD10-I70.0) and Emphysema (ICD10-J43.9).   These results will be called to the ordering clinician or representative by the Radiologist Assistant, and communication documented in the PACS or [employer].     Electronically Signed   By: Yetta Glassman M.D.   On: 09/26/2020 21:12

## 2020-09-28 NOTE — Telephone Encounter (Signed)
PET scan result has been called to the patient who verbalized understanding.   This patient needs to be placed on Dr. Juline Patch office schedule as soon as possible. He needs biopsy of a lung nodule. Please call patient and set up. If it is a long wait, please check with [employer] and see if he agrees to double booking.   Langley Gauss, can you please fax a copy of the PET scan to Dr. Deboraha Sprang office. Thanks so much

## 2020-09-28 NOTE — Telephone Encounter (Signed)
I called and spoke with patient and made appt with Dr. Valeta Harms on 10/22/20. Patient verbalized understanding, nothing further needed.

## 2020-10-01 ENCOUNTER — Telehealth: Payer: Self-pay | Admitting: Acute Care

## 2020-10-01 NOTE — Telephone Encounter (Signed)
PET scan results faxed to Dr Sandi Mariscal with notes of f/u plans with Dr Valeta Harms.

## 2020-10-01 NOTE — Telephone Encounter (Signed)
I have called and spoke with pt and he is aware that BI would like to see the pt first to discuss the results of the CT scan and any other tests and then they can come up with a plan if one is needed.   Pt voiced his understanding and nothing further is needed.

## 2020-10-22 ENCOUNTER — Other Ambulatory Visit: Payer: Self-pay

## 2020-10-22 ENCOUNTER — Encounter: Payer: Self-pay | Admitting: Pulmonary Disease

## 2020-10-22 ENCOUNTER — Ambulatory Visit: Payer: Medicare Other | Admitting: Pulmonary Disease

## 2020-10-22 ENCOUNTER — Telehealth: Payer: Self-pay | Admitting: Pulmonary Disease

## 2020-10-22 VITALS — BP 122/68 | HR 83 | Temp 97.9°F | Ht 67.0 in | Wt 127.0 lb

## 2020-10-22 DIAGNOSIS — R911 Solitary pulmonary nodule: Secondary | ICD-10-CM | POA: Insufficient documentation

## 2020-10-22 DIAGNOSIS — D7389 Other diseases of spleen: Secondary | ICD-10-CM

## 2020-10-22 DIAGNOSIS — Z720 Tobacco use: Secondary | ICD-10-CM

## 2020-10-22 DIAGNOSIS — J449 Chronic obstructive pulmonary disease, unspecified: Secondary | ICD-10-CM

## 2020-10-22 DIAGNOSIS — J432 Centrilobular emphysema: Secondary | ICD-10-CM

## 2020-10-22 DIAGNOSIS — C3431 Malignant neoplasm of lower lobe, right bronchus or lung: Secondary | ICD-10-CM | POA: Insufficient documentation

## 2020-10-22 DIAGNOSIS — R9389 Abnormal findings on diagnostic imaging of other specified body structures: Secondary | ICD-10-CM

## 2020-10-22 DIAGNOSIS — R942 Abnormal results of pulmonary function studies: Secondary | ICD-10-CM | POA: Diagnosis not present

## 2020-10-22 MED ORDER — TRELEGY ELLIPTA 100-62.5-25 MCG/INH IN AEPB: 1.0000 | INHALATION_SPRAY | Freq: Every day | RESPIRATORY_TRACT | 0 refills | Status: DC

## 2020-10-22 MED ORDER — TRELEGY ELLIPTA 100-62.5-25 MCG/INH IN AEPB: 1.0000 | INHALATION_SPRAY | Freq: Every day | RESPIRATORY_TRACT | 6 refills | Status: AC

## 2020-10-22 NOTE — Patient Instructions (Signed)
Thank you for visiting Dr. Valeta Harms at [facility]. Today we recommend the following:  Orders Placed This Encounter  Procedures   Procedural/ Surgical Case Request: VIDEO BRONCHOSCOPY WITH ENDOBRONCHIAL NAVIGATION   CT Super D Chest Wo Contrast   Ambulatory referral to Pulmonology   Planned bronchoscopy of 11/13/2020  Return in about 4 weeks (around 11/19/2020) for with APP or Dr. Valeta Harms.    Please do your part to reduce the spread of COVID-19.

## 2020-10-22 NOTE — Telephone Encounter (Signed)
Pt has been scheduled at [facility] for 11/1 at 10:50.  He will go for CT at [facility] on 10/28 and go for his covid test that day as well.  I gave appt info to pt and told him to take last dose of plavix on 10/25.

## 2020-10-22 NOTE — H&P (View-Only) (Signed)
Synopsis: Referred in October 2022 for abnormal ct by Derinda Late, MD  Subjective:   PATIENT ID: Kevin Brooks GENDER: male DOB: 1945/02/18, MRN: [id number]  Chief Complaint  Patient presents with   Consult    This is 75 yo M, medical history of COPD, emphysema, gastroesophageal reflux, longstanding history of tobacco abuse.  He has smoked since he was 75 years old.  He has been enrolled in lung cancer screening program.  He had a repeat lung cancer screening CT that was completed last month with revealed a right lower lobe subsolid lesion.  Patient underwent PET scan for this which revealed low-level metabolic uptake concerning for indolent neoplasm.  Additionally has a lesion found within his spleen that was hypermetabolic with SUV of 7.  Patient was referred today to discuss neck steps regarding lung nodule and consideration for biopsy.  He does have COPD currently managed with Spiriva and Serevent.   Past Medical History:  Diagnosis Date   BPH (benign prostatic hyperplasia)    Cataracta    COPD (chronic obstructive pulmonary disease) (HCC)    Depression    ED (erectile dysfunction)    GERD (gastroesophageal reflux disease)    Hypertension    Hypogonadism male    Mixed hyperlipidemia    Palpitations    TIA (transient ischemic attack)    Vision abnormalities      Family History  Problem Relation Age of Onset   Stroke Father      Past Surgical History:  Procedure Laterality Date    catarcts surgery     COLONOSCOPY     COLONOSCOPY WITH PROPOFOL N/A 03/16/2020   Procedure: COLONOSCOPY WITH PROPOFOL;  Surgeon: Carol Ada, MD;  Location: [facility];  Service: Endoscopy;  Laterality: N/A;   ENTEROSCOPY N/A 03/16/2020   Procedure: ENTEROSCOPY;  Surgeon: Carol Ada, MD;  Location: [facility];  Service: Endoscopy;  Laterality: N/A;   HEMOSTASIS CLIP PLACEMENT  03/16/2020   Procedure: HEMOSTASIS CLIP PLACEMENT;  Surgeon: Carol Ada, MD;  [facility]: [facility];   Service: Endoscopy;;   HOT HEMOSTASIS N/A 03/16/2020   Procedure: HOT HEMOSTASIS (ARGON PLASMA COAGULATION/BICAP);  Surgeon: Carol Ada, MD;  Location: [facility];  Service: Endoscopy;  Laterality: N/A;   POLYPECTOMY  03/16/2020   Procedure: POLYPECTOMY;  Surgeon: Carol Ada, MD;  [facility];  Service: Endoscopy;;    Social History   Socioeconomic History   Marital status: Single    Spouse name: Not on file   Number of children: Not on file   Years of education: Not on file   Highest education level: Not on file  Occupational History   Not on file  Tobacco Use   Smoking status: Every Day    Packs/day: 0.25    Types: Cigarettes   Smokeless tobacco: Never   Tobacco comments:    smokes 5 per day  Vaping Use   Vaping Use: Every day  Substance and Sexual Activity   Alcohol use: Yes    Alcohol/week: 1.0 standard drink    Types: 1 Cans of beer per week    Comment: beer daily   Drug use: No   Sexual activity: Not on file  Other Topics Concern   Not on file  Social History Narrative   Not on file   Social Determinants of Health   Financial Resource Strain: Not on file  Food Insecurity: Not on file  Transportation Needs: Not on file  Physical Activity: Not on file  Stress: Not on file  Social Connections: Not on file  Intimate Partner Violence: Not on file     No Known Allergies   Outpatient Medications Prior to Visit  Medication Sig Dispense Refill   albuterol (VENTOLIN HFA) 108 (90 Base) MCG/ACT inhaler Inhale 2 puffs into the lungs every 6 (six) hours as needed for wheezing or shortness of breath.     cholecalciferol (VITAMIN D3) 25 MCG (1000 UNIT) tablet Take 1,000 Units by mouth daily.     citalopram (CELEXA) 40 MG tablet Take 40 mg by mouth daily.     clopidogrel (PLAVIX) 75 MG tablet Take 75 mg by mouth daily.  1   losartan (COZAAR) 100 MG tablet Take 100 mg by mouth every morning.     omeprazole (PRILOSEC) 20 MG capsule Take 20 mg by mouth  daily.     PRESCRIPTION MEDICATION Apply 1 mL topically daily. Testosterone Lipoderm 20% compounded at [place] - apply to stomach or chest     rosuvastatin (CRESTOR) 5 MG tablet Take 5 mg by mouth daily.     SEREVENT DISKUS 50 MCG/DOSE diskus inhaler Inhale 1 puff into the lungs 2 (two) times daily.     tadalafil (CIALIS) 5 MG tablet Take 5 mg by mouth at bedtime.     tamsulosin (FLOMAX) 0.4 MG CAPS capsule Take 0.4 mg by mouth at bedtime.     tiotropium (SPIRIVA) 18 MCG inhalation capsule Place 18 mcg into inhaler and inhale daily.     No facility-administered medications prior to visit.    Review of Systems  Constitutional:  Negative for chills, fever, malaise/fatigue and weight loss.  HENT:  Negative for hearing loss, sore throat and tinnitus.   Eyes:  Negative for blurred vision and double vision.  Respiratory:  Negative for cough, hemoptysis, sputum production, shortness of breath, wheezing and stridor.   Cardiovascular:  Negative for chest pain, palpitations, orthopnea, leg swelling and PND.  Gastrointestinal:  Negative for abdominal pain, constipation, diarrhea, heartburn, nausea and vomiting.  Genitourinary:  Negative for dysuria, hematuria and urgency.  Musculoskeletal:  Negative for joint pain and myalgias.  Skin:  Negative for itching and rash.  Neurological:  Negative for dizziness, tingling, weakness and headaches.  Endo/Heme/Allergies:  Negative for environmental allergies. Does not bruise/bleed easily.  Psychiatric/Behavioral:  Negative for depression. The patient is not nervous/anxious and does not have insomnia.   All other systems reviewed and are negative.   Objective:  Physical Exam Vitals reviewed.  Constitutional:      General: He is not in acute distress.    Appearance: He is well-developed.     Comments: Thin elderly male  HENT:     Head: Normocephalic and atraumatic.  Eyes:     General: No scleral icterus.    Conjunctiva/sclera: Conjunctivae  normal.     Pupils: Pupils are equal, round, and reactive to light.  Neck:     Vascular: No JVD.     Trachea: No tracheal deviation.  Cardiovascular:     Rate and Rhythm: Normal rate and regular rhythm.     Heart sounds: Normal heart sounds. No murmur heard. Pulmonary:     Effort: Pulmonary effort is normal. No tachypnea, accessory muscle usage or respiratory distress.     Breath sounds: No stridor. No wheezing, rhonchi or rales.     Comments: Diminished breath sounds bilaterally no wheeze Abdominal:     General: Bowel sounds are normal. There is no distension.     Palpations: Abdomen is soft.  Tenderness: There is no abdominal tenderness.  Musculoskeletal:        General: No tenderness.     Cervical back: Neck supple.  Lymphadenopathy:     Cervical: No cervical adenopathy.  Skin:    General: Skin is warm and dry.     Capillary Refill: Capillary refill takes less than 2 seconds.     Findings: No rash.  Neurological:     Mental Status: He is alert and oriented to person, place, and time.  Psychiatric:        Behavior: Behavior normal.     Vitals:   10/22/20 1331  BP: 122/68  Pulse: 83  Temp: 97.9 F (36.6 C)  TempSrc: Oral  SpO2: 97%  Weight: 127 lb (57.6 kg)  Height: 5\' 7"  (1.702 m)   97% on RA BMI Readings from Last 3 Encounters:  10/22/20 19.89 kg/m  03/16/20 20.67 kg/m  08/02/19 20.06 kg/m   Wt Readings from Last 3 Encounters:  10/22/20 127 lb (57.6 kg)  03/16/20 132 lb (59.9 kg)  08/02/19 130 lb (59 kg)     CBC    Component Value Date/Time   WBC 7.3 05/29/2014 1325   RBC 5.08 05/29/2014 1325   HGB 17.0 05/29/2014 1325   HCT 47.1 05/29/2014 1325   PLT 152 05/29/2014 1325   MCV 92.7 05/29/2014 1325   MCH 33.5 05/29/2014 1325   MCHC 36.1 (H) 05/29/2014 1325   RDW 12.5 05/29/2014 1325    Chest Imaging: 09/26/2020: Nuclear medicine pet imaging reveals a subsolid pulmonary nodule within the right right lower lobe at 1.4 cm low-level SUV  concerning for malignancy. Also splenic lesion which is hypermetabolic concerning for lymphoma versus metastasis. The patient's images have been independently reviewed by me.     Pulmonary Functions Testing Results: PFT Results Latest Ref Rng & Units 09/13/2020  FVC-Pre L 2.85  FVC-Predicted Pre % 71  FVC-Post L 3.35  FVC-Predicted Post % 84  Pre FEV1/FVC % % 50  Post FEV1/FCV % % 48  FEV1-Pre L 1.42  FEV1-Predicted Pre % 49  FEV1-Post L 1.60  DLCO uncorrected ml/min/mmHg 14.46  DLCO UNC% % 60  DLCO corrected ml/min/mmHg 14.46  DLCO COR %Predicted % 60  DLVA Predicted % 57  TLC L 8.20  TLC % Predicted % 123  RV % Predicted % 191    FeNO:   Pathology:   Echocardiogram:   Heart Catheterization:     Assessment & Plan:     ICD-10-CM   1. Lung nodule  R91.1 Procedural/ Surgical Case Request: VIDEO BRONCHOSCOPY WITH ENDOBRONCHIAL NAVIGATION    Ambulatory referral to Pulmonology    CT Super D Chest Wo Contrast    2. Lesion of spleen  D73.89     3. Abnormal CT of the chest  R93.89     4. Abnormal PET of right lung  R94.2     5. Chronic obstructive pulmonary disease, unspecified COPD type ([place])  J44.9     6. Centrilobular emphysema ([place])  J43.2     7. Tobacco use  Z72.0       Discussion:  This is a 75 year old gentleman, lung nodule within the right lower lobe as well as hypermetabolic splenic lesion.  I suspect he has a primary lung cancer in the right side.  I assume that the splenic lesion is unrelated.  Potentially has a underlying lymphoma as well.  He is a longstanding history of tobacco abuse.  Plan: We discussed today in the office  risk benefits and alternatives of proceeding with robotic assisted bronchoscopy. We discussed the risk of bleeding, and pneumothorax. Patient is agreeable to proceed with bronchoscopy. We will tentatively schedule for 11/13/2020.  As for his COPD we will switch from his current inhaler regimen to Trelegy. Trelegy samples 100  today, and new prescription. Patient to follow-up with [place] after bronchoscopy. I will reach out to medical oncology regarding splenic lesion. I suspect he will need a interventional radiology guided CT biopsy of this.  If not I guess may need to consider removal of the spleen for tissue sampling.  I am unsure of the bleeding risk associated with needle biopsy of the spleen.    Current Outpatient Medications:    albuterol (VENTOLIN HFA) 108 (90 Base) MCG/ACT inhaler, Inhale 2 puffs into the lungs every 6 (six) hours as needed for wheezing or shortness of breath., Disp: , Rfl:    cholecalciferol (VITAMIN D3) 25 MCG (1000 UNIT) tablet, Take 1,000 Units by mouth daily., Disp: , Rfl:    citalopram (CELEXA) 40 MG tablet, Take 40 mg by mouth daily., Disp: , Rfl:    clopidogrel (PLAVIX) 75 MG tablet, Take 75 mg by mouth daily., Disp: , Rfl: 1   losartan (COZAAR) 100 MG tablet, Take 100 mg by mouth every morning., Disp: , Rfl:    omeprazole (PRILOSEC) 20 MG capsule, Take 20 mg by mouth daily., Disp: , Rfl:    PRESCRIPTION MEDICATION, Apply 1 mL topically daily. Testosterone Lipoderm 20% compounded at [place] - apply to stomach or chest, Disp: , Rfl:    rosuvastatin (CRESTOR) 5 MG tablet, Take 5 mg by mouth daily., Disp: , Rfl:    SEREVENT DISKUS 50 MCG/DOSE diskus inhaler, Inhale 1 puff into the lungs 2 (two) times daily., Disp: , Rfl:    tadalafil (CIALIS) 5 MG tablet, Take 5 mg by mouth at bedtime., Disp: , Rfl:    tamsulosin (FLOMAX) 0.4 MG CAPS capsule, Take 0.4 mg by mouth at bedtime., Disp: , Rfl:    tiotropium (SPIRIVA) 18 MCG inhalation capsule, Place 18 mcg into inhaler and inhale daily., Disp: , Rfl:   I spent 63 minutes dedicated to the care of this patient on the date of this encounter to include pre-visit review of records, face-to-face time with the patient discussing conditions above, post visit ordering of testing, clinical documentation with the electronic health record,  making appropriate referrals as documented, and communicating necessary findings to members of the patients care team.   Garner Nash, DO [facility] 10/22/2020 1:55 PM

## 2020-10-22 NOTE — Progress Notes (Signed)
Synopsis: Referred in October 2022 for abnormal ct by Derinda Late, MD  Subjective:   PATIENT ID: Kevin Brooks GENDER: male DOB: [birth year], MRN: [id number]  Chief Complaint  Patient presents with   Consult    This is 75 yo M, medical history of COPD, emphysema, gastroesophageal reflux, longstanding history of tobacco abuse.  He has smoked since he was 75 years old.  He has been enrolled in lung cancer screening program.  He had a repeat lung cancer screening CT that was completed last month with revealed a right lower lobe subsolid lesion.  Patient underwent PET scan for this which revealed low-level metabolic uptake concerning for indolent neoplasm.  Additionally has a lesion found within his spleen that was hypermetabolic with SUV of 7.  Patient was referred today to discuss neck steps regarding lung nodule and consideration for biopsy.  He does have COPD currently managed with Spiriva and Serevent.   Past Medical History:  Diagnosis Date   BPH (benign prostatic hyperplasia)    Cataracta    COPD (chronic obstructive pulmonary disease) (HCC)    Depression    ED (erectile dysfunction)    GERD (gastroesophageal reflux disease)    Hypertension    Hypogonadism male    Mixed hyperlipidemia    Palpitations    TIA (transient ischemic attack)    Vision abnormalities      Family History  Problem Relation Age of Onset   Stroke Father      Past Surgical History:  Procedure Laterality Date    catarcts surgery     COLONOSCOPY     COLONOSCOPY WITH PROPOFOL N/A 03/16/2020   Procedure: COLONOSCOPY WITH PROPOFOL;  Surgeon: Carol Ada, MD;  [facility]: [facility];  Service: Endoscopy;  Laterality: N/A;   ENTEROSCOPY N/A 03/16/2020   Procedure: ENTEROSCOPY;  Surgeon: Carol Ada, MD;  [facility]: [facility];  Service: Endoscopy;  Laterality: N/A;   HEMOSTASIS CLIP PLACEMENT  03/16/2020   Procedure: HEMOSTASIS CLIP PLACEMENT;  Surgeon: Carol Ada, MD;  Location: [facility];   Service: Endoscopy;;   HOT HEMOSTASIS N/A 03/16/2020   Procedure: HOT HEMOSTASIS (ARGON PLASMA COAGULATION/BICAP);  Surgeon: Carol Ada, MD;  Location: [facility];  Service: Endoscopy;  Laterality: N/A;   POLYPECTOMY  03/16/2020   Procedure: POLYPECTOMY;  Surgeon: Carol Ada, MD;  [facility];  Service: Endoscopy;;    Social History   Socioeconomic History   Marital status: Single    Spouse name: Not on file   Number of children: Not on file   Years of education: Not on file   Highest education level: Not on file  Occupational History   Not on file  Tobacco Use   Smoking status: Every Day    Packs/day: 0.25    Types: Cigarettes   Smokeless tobacco: Never   Tobacco comments:    smokes 5 per day  Vaping Use   Vaping Use: Every day  Substance and Sexual Activity   Alcohol use: Yes    Alcohol/week: 1.0 standard drink    Types: 1 Cans of beer per week    Comment: beer daily   Drug use: No   Sexual activity: Not on file  Other Topics Concern   Not on file  Social History Narrative   Not on file   Social Determinants of Health   Financial Resource Strain: Not on file  Food Insecurity: Not on file  Transportation Needs: Not on file  Physical Activity: Not on file  Stress: Not on file  Social Connections: Not on file  Intimate Partner Violence: Not on file     No Known Allergies   Outpatient Medications Prior to Visit  Medication Sig Dispense Refill   albuterol (VENTOLIN HFA) 108 (90 Base) MCG/ACT inhaler Inhale 2 puffs into the lungs every 6 (six) hours as needed for wheezing or shortness of breath.     cholecalciferol (VITAMIN D3) 25 MCG (1000 UNIT) tablet Take 1,000 Units by mouth daily.     citalopram (CELEXA) 40 MG tablet Take 40 mg by mouth daily.     clopidogrel (PLAVIX) 75 MG tablet Take 75 mg by mouth daily.  1   losartan (COZAAR) 100 MG tablet Take 100 mg by mouth every morning.     omeprazole (PRILOSEC) 20 MG capsule Take 20 mg by mouth  daily.     PRESCRIPTION MEDICATION Apply 1 mL topically daily. Testosterone Lipoderm 20% compounded at [place] - apply to stomach or chest     rosuvastatin (CRESTOR) 5 MG tablet Take 5 mg by mouth daily.     SEREVENT DISKUS 50 MCG/DOSE diskus inhaler Inhale 1 puff into the lungs 2 (two) times daily.     tadalafil (CIALIS) 5 MG tablet Take 5 mg by mouth at bedtime.     tamsulosin (FLOMAX) 0.4 MG CAPS capsule Take 0.4 mg by mouth at bedtime.     tiotropium (SPIRIVA) 18 MCG inhalation capsule Place 18 mcg into inhaler and inhale daily.     No facility-administered medications prior to visit.    Review of Systems  Constitutional:  Negative for chills, fever, malaise/fatigue and weight loss.  HENT:  Negative for hearing loss, sore throat and tinnitus.   Eyes:  Negative for blurred vision and double vision.  Respiratory:  Negative for cough, hemoptysis, sputum production, shortness of breath, wheezing and stridor.   Cardiovascular:  Negative for chest pain, palpitations, orthopnea, leg swelling and PND.  Gastrointestinal:  Negative for abdominal pain, constipation, diarrhea, heartburn, nausea and vomiting.  Genitourinary:  Negative for dysuria, hematuria and urgency.  Musculoskeletal:  Negative for joint pain and myalgias.  Skin:  Negative for itching and rash.  Neurological:  Negative for dizziness, tingling, weakness and headaches.  Endo/Heme/Allergies:  Negative for environmental allergies. Does not bruise/bleed easily.  Psychiatric/Behavioral:  Negative for depression. The patient is not nervous/anxious and does not have insomnia.   All other systems reviewed and are negative.   Objective:  Physical Exam Vitals reviewed.  Constitutional:      General: He is not in acute distress.    Appearance: He is well-developed.     Comments: Thin elderly male  HENT:     Head: Normocephalic and atraumatic.  Eyes:     General: No scleral icterus.    Conjunctiva/sclera: Conjunctivae  normal.     Pupils: Pupils are equal, round, and reactive to light.  Neck:     Vascular: No JVD.     Trachea: No tracheal deviation.  Cardiovascular:     Rate and Rhythm: Normal rate and regular rhythm.     Heart sounds: Normal heart sounds. No murmur heard. Pulmonary:     Effort: Pulmonary effort is normal. No tachypnea, accessory muscle usage or respiratory distress.     Breath sounds: No stridor. No wheezing, rhonchi or rales.     Comments: Diminished breath sounds bilaterally no wheeze Abdominal:     General: Bowel sounds are normal. There is no distension.     Palpations: Abdomen is soft.  Tenderness: There is no abdominal tenderness.  Musculoskeletal:        General: No tenderness.     Cervical back: Neck supple.  Lymphadenopathy:     Cervical: No cervical adenopathy.  Skin:    General: Skin is warm and dry.     Capillary Refill: Capillary refill takes less than 2 seconds.     Findings: No rash.  Neurological:     Mental Status: He is alert and oriented to person, place, and time.  Psychiatric:        Behavior: Behavior normal.     Vitals:   10/22/20 1331  BP: 122/68  Pulse: 83  Temp: 97.9 F (36.6 C)  TempSrc: Oral  SpO2: 97%  Weight: 127 lb (57.6 kg)  Height: 5\' 7"  (1.702 m)   97% on RA BMI Readings from Last 3 Encounters:  10/22/20 19.89 kg/m  03/16/20 20.67 kg/m  08/02/19 20.06 kg/m   Wt Readings from Last 3 Encounters:  10/22/20 127 lb (57.6 kg)  03/16/20 132 lb (59.9 kg)  08/02/19 130 lb (59 kg)     CBC    Component Value Date/Time   WBC 7.3 05/29/2014 1325   RBC 5.08 05/29/2014 1325   HGB 17.0 05/29/2014 1325   HCT 47.1 05/29/2014 1325   PLT 152 05/29/2014 1325   MCV 92.7 05/29/2014 1325   MCH 33.5 05/29/2014 1325   MCHC 36.1 (H) 05/29/2014 1325   RDW 12.5 05/29/2014 1325    Chest Imaging: 09/26/2020: Nuclear medicine pet imaging reveals a subsolid pulmonary nodule within the right right lower lobe at 1.4 cm low-level SUV  concerning for malignancy. Also splenic lesion which is hypermetabolic concerning for lymphoma versus metastasis. The patient's images have been independently reviewed by me.     Pulmonary Functions Testing Results: PFT Results Latest Ref Rng & Units 09/13/2020  FVC-Pre L 2.85  FVC-Predicted Pre % 71  FVC-Post L 3.35  FVC-Predicted Post % 84  Pre FEV1/FVC % % 50  Post FEV1/FCV % % 48  FEV1-Pre L 1.42  FEV1-Predicted Pre % 49  FEV1-Post L 1.60  DLCO uncorrected ml/min/mmHg 14.46  DLCO UNC% % 60  DLCO corrected ml/min/mmHg 14.46  DLCO COR %Predicted % 60  DLVA Predicted % 57  TLC L 8.20  TLC % Predicted % 123  RV % Predicted % 191    FeNO:   Pathology:   Echocardiogram:   Heart Catheterization:     Assessment & Plan:     ICD-10-CM   1. Lung nodule  R91.1 Procedural/ Surgical Case Request: VIDEO BRONCHOSCOPY WITH ENDOBRONCHIAL NAVIGATION    Ambulatory referral to Pulmonology    CT Super D Chest Wo Contrast    2. Lesion of spleen  D73.89     3. Abnormal CT of the chest  R93.89     4. Abnormal PET of right lung  R94.2     5. Chronic obstructive pulmonary disease, unspecified COPD type ([place])  J44.9     6. Centrilobular emphysema ([place])  J43.2     7. Tobacco use  Z72.0       Discussion:  This is a 75 year old gentleman, lung nodule within the right lower lobe as well as hypermetabolic splenic lesion.  I suspect he has a primary lung cancer in the right side.  I assume that the splenic lesion is unrelated.  Potentially has a underlying lymphoma as well.  He is a longstanding history of tobacco abuse.  Plan: We discussed today in the office  risk benefits and alternatives of proceeding with robotic assisted bronchoscopy. We discussed the risk of bleeding, and pneumothorax. Patient is agreeable to proceed with bronchoscopy. We will tentatively schedule for 11/13/2020.  As for his COPD we will switch from his current inhaler regimen to Trelegy. Trelegy samples 100  today, and new prescription. Patient to follow-up with [place] after bronchoscopy. I will reach out to medical oncology regarding splenic lesion. I suspect he will need a interventional radiology guided CT biopsy of this.  If not I guess may need to consider removal of the spleen for tissue sampling.  I am unsure of the bleeding risk associated with needle biopsy of the spleen.    Current Outpatient Medications:    albuterol (VENTOLIN HFA) 108 (90 Base) MCG/ACT inhaler, Inhale 2 puffs into the lungs every 6 (six) hours as needed for wheezing or shortness of breath., Disp: , Rfl:    cholecalciferol (VITAMIN D3) 25 MCG (1000 UNIT) tablet, Take 1,000 Units by mouth daily., Disp: , Rfl:    citalopram (CELEXA) 40 MG tablet, Take 40 mg by mouth daily., Disp: , Rfl:    clopidogrel (PLAVIX) 75 MG tablet, Take 75 mg by mouth daily., Disp: , Rfl: 1   losartan (COZAAR) 100 MG tablet, Take 100 mg by mouth every morning., Disp: , Rfl:    omeprazole (PRILOSEC) 20 MG capsule, Take 20 mg by mouth daily., Disp: , Rfl:    PRESCRIPTION MEDICATION, Apply 1 mL topically daily. Testosterone Lipoderm 20% compounded at [place] - apply to stomach or chest, Disp: , Rfl:    rosuvastatin (CRESTOR) 5 MG tablet, Take 5 mg by mouth daily., Disp: , Rfl:    SEREVENT DISKUS 50 MCG/DOSE diskus inhaler, Inhale 1 puff into the lungs 2 (two) times daily., Disp: , Rfl:    tadalafil (CIALIS) 5 MG tablet, Take 5 mg by mouth at bedtime., Disp: , Rfl:    tamsulosin (FLOMAX) 0.4 MG CAPS capsule, Take 0.4 mg by mouth at bedtime., Disp: , Rfl:    tiotropium (SPIRIVA) 18 MCG inhalation capsule, Place 18 mcg into inhaler and inhale daily., Disp: , Rfl:   I spent 63 minutes dedicated to the care of this patient on the date of this encounter to include pre-visit review of records, face-to-face time with the patient discussing conditions above, post visit ordering of testing, clinical documentation with the electronic health record,  making appropriate referrals as documented, and communicating necessary findings to members of the patients care team.   Garner Nash, DO [facility] 10/22/2020 1:55 PM

## 2020-10-22 NOTE — Addendum Note (Signed)
Addended by: Fran Lowes on: 10/22/2020 05:36 PM   Modules accepted: Orders

## 2020-10-23 NOTE — Telephone Encounter (Signed)
PCCM:  Please let patient know that I spoke with general surgery and oncology. I am placing a referral to see general surgery to discuss his spleen.   Thanks  BLI

## 2020-10-24 ENCOUNTER — Telehealth: Payer: Self-pay

## 2020-10-24 NOTE — Telephone Encounter (Signed)
ATC patient to see if we can switch his appointment from 11/21/20 with Dr. Valeta Harms to [facility] Form on 11/19/2020 in the morning. Left message for him to call back  If patient agrees to switch please switch him to 11/19/20 with Judson Roch

## 2020-10-29 ENCOUNTER — Telehealth: Payer: Self-pay | Admitting: Pulmonary Disease

## 2020-10-29 NOTE — Telephone Encounter (Signed)
Tried calling the pt, line still busy

## 2020-10-29 NOTE — Telephone Encounter (Signed)
Tried to call pt multiple times but each time when trying to call pt, immediately receive a busy signal.  Will try to call back later.

## 2020-10-30 NOTE — Telephone Encounter (Signed)
We have tried multiple times and the line rings busy.  Will sign off of this message per office protocol.

## 2020-11-01 NOTE — Telephone Encounter (Signed)
Patient would like to discuss appointment with Eric Form NP. Patient phone number is [phone].

## 2020-11-01 NOTE — Telephone Encounter (Signed)
I called the patient back and he wants to let you know that he was not sure that there was a mix up with the schedule and he was concerned. He wants the PCP involved and to call the shots on his care.   I called him and made him feel better about the mix up with the schedule. He is aware that he will get the biopsy on 11/13/20 and will keep the follow up with Sarah on 11/19/20. FYI to provider.

## 2020-11-09 ENCOUNTER — Other Ambulatory Visit: Payer: Self-pay | Admitting: Pulmonary Disease

## 2020-11-09 ENCOUNTER — Other Ambulatory Visit: Payer: Self-pay

## 2020-11-09 ENCOUNTER — Ambulatory Visit (INDEPENDENT_AMBULATORY_CARE_PROVIDER_SITE_OTHER)
Admission: RE | Admit: 2020-11-09 | Discharge: 2020-11-09 | Disposition: A | Discharge status: Home / Self Care | Payer: Medicare Other | Source: Ambulatory Visit | Attending: Pulmonary Disease | Admitting: Pulmonary Disease

## 2020-11-09 DIAGNOSIS — R911 Solitary pulmonary nodule: Secondary | ICD-10-CM

## 2020-11-09 LAB — SARS CORONAVIRUS 2 (TAT 6-24 HRS): SARS Coronavirus 2: NEGATIVE

## 2020-11-10 ENCOUNTER — Encounter (HOSPITAL_COMMUNITY): Payer: Self-pay | Admitting: Pulmonary Disease

## 2020-11-10 NOTE — Pre-Procedure Instructions (Signed)
PCP - Dr. Louanna Raw Cardiologist - n/a   EKG - DOS CT Chest x-ray - 09/05/19 ECHO - 11/04/16 Cardiac Cath - n/a CPAP - n/a    Blood Thinner Instructions: Plavix last dose 11/05/20 Aspirin Instructions: n/a  ERAS Protcol - yes  COVID TEST- n/a  Anesthesia review: yes  -------------  SDW INSTRUCTIONS:  Your procedure is scheduled on 11/13/20 . Please report to [facility]trance "A" at 08:15 A.M., and check in at the Admitting office. Call this number if you have problems the morning of surgery: [phone]   Remember: Do not eat after midnight the night before your surgery  You may drink clear liquids until 07:45 the morning of your surgery.   Clear liquids allowed are: Water, Non-Citrus Juices (without pulp), Carbonated Beverages, Clear Tea, Black Coffee Only, and Gatorade   Medications to take morning of surgery with a sip of water include: Celexa, prilosec  Please bring all inhalers with you the day of surgery.    As of today, STOP taking any Aspirin (unless otherwise instructed by your surgeon), Aleve, Naproxen, Ibuprofen, Motrin, Advil, Goody's, BC's, all herbal medications, fish oil, and all vitamins.    The Morning of Surgery Do not wear jewelry, make-up or nail polish. Do not wear lotions, powders, or perfumes/colognes, or deodorant Do not shave 48 hours prior to surgery.   Men may shave face and neck. Do not bring valuables to the hospital. [facility] is not responsible for any belongings or valuables.  If you are a smoker, DO NOT Smoke 24 hours prior to surgery  If you wear a CPAP at night please bring your mask the morning of surgery   Remember that you must have someone to transport you home after your surgery, and remain with you for 24 hours if you are discharged the same day.  Please bring cases for contacts, glasses, hearing aids, dentures or bridgework because it cannot be worn into surgery.   Patients discharged the day of surgery will not  be allowed to drive home.   Please shower the NIGHT BEFORE/MORNING OF SURGERY (use antibacterial soap like DIAL soap if possible). Wear comfortable clothes the morning of surgery. Oral Hygiene is also important to reduce your risk of infection.  Remember - BRUSH YOUR TEETH THE MORNING OF SURGERY WITH YOUR REGULAR TOOTHPASTE  Patient denies shortness of breath, fever, cough and chest pain.

## 2020-11-13 ENCOUNTER — Encounter (HOSPITAL_COMMUNITY)
Admission: RE | Disposition: A | Discharge status: Home / Self Care | Payer: Self-pay | Source: Home / Self Care | Attending: Pulmonary Disease

## 2020-11-13 ENCOUNTER — Ambulatory Visit (HOSPITAL_COMMUNITY): Payer: Medicare Other

## 2020-11-13 ENCOUNTER — Ambulatory Visit (HOSPITAL_COMMUNITY): Payer: Medicare Other | Admitting: Anesthesiology

## 2020-11-13 ENCOUNTER — Ambulatory Visit (HOSPITAL_COMMUNITY)
Admission: RE | Admit: 2020-11-13 | Discharge: 2020-11-13 | Disposition: A | Discharge status: Home / Self Care | Payer: Medicare Other | Attending: Pulmonary Disease | Admitting: Pulmonary Disease

## 2020-11-13 ENCOUNTER — Encounter (HOSPITAL_COMMUNITY): Payer: Self-pay | Admitting: Pulmonary Disease

## 2020-11-13 ENCOUNTER — Other Ambulatory Visit: Payer: Self-pay

## 2020-11-13 DIAGNOSIS — Z7902 Long term (current) use of antithrombotics/antiplatelets: Secondary | ICD-10-CM | POA: Diagnosis not present

## 2020-11-13 DIAGNOSIS — Z823 Family history of stroke: Secondary | ICD-10-CM | POA: Insufficient documentation

## 2020-11-13 DIAGNOSIS — Z7951 Long term (current) use of inhaled steroids: Secondary | ICD-10-CM | POA: Diagnosis not present

## 2020-11-13 DIAGNOSIS — J439 Emphysema, unspecified: Secondary | ICD-10-CM | POA: Insufficient documentation

## 2020-11-13 DIAGNOSIS — R911 Solitary pulmonary nodule: Secondary | ICD-10-CM | POA: Diagnosis present

## 2020-11-13 DIAGNOSIS — Z79899 Other long term (current) drug therapy: Secondary | ICD-10-CM | POA: Insufficient documentation

## 2020-11-13 DIAGNOSIS — K219 Gastro-esophageal reflux disease without esophagitis: Secondary | ICD-10-CM | POA: Diagnosis not present

## 2020-11-13 DIAGNOSIS — Z9889 Other specified postprocedural states: Secondary | ICD-10-CM

## 2020-11-13 DIAGNOSIS — F17210 Nicotine dependence, cigarettes, uncomplicated: Secondary | ICD-10-CM | POA: Diagnosis not present

## 2020-11-13 DIAGNOSIS — Z419 Encounter for procedure for purposes other than remedying health state, unspecified: Secondary | ICD-10-CM

## 2020-11-13 DIAGNOSIS — C3490 Malignant neoplasm of unspecified part of unspecified bronchus or lung: Secondary | ICD-10-CM

## 2020-11-13 DIAGNOSIS — C3431 Malignant neoplasm of lower lobe, right bronchus or lung: Secondary | ICD-10-CM | POA: Insufficient documentation

## 2020-11-13 HISTORY — PX: VIDEO BRONCHOSCOPY WITH RADIAL ENDOBRONCHIAL ULTRASOUND: SHX6849

## 2020-11-13 HISTORY — DX: Malignant neoplasm of unspecified part of unspecified bronchus or lung: C34.90

## 2020-11-13 HISTORY — PX: BRONCHIAL NEEDLE ASPIRATION BIOPSY: SHX5106

## 2020-11-13 HISTORY — PX: BRONCHIAL BRUSHINGS: SHX5108

## 2020-11-13 HISTORY — PX: VIDEO BRONCHOSCOPY WITH ENDOBRONCHIAL NAVIGATION: SHX6175

## 2020-11-13 HISTORY — PX: BRONCHIAL BIOPSY: SHX5109

## 2020-11-13 LAB — BASIC METABOLIC PANEL
Anion gap: 5 (ref 5–15)
BUN: 13 mg/dL (ref 8–23)
CO2: 30 mmol/L (ref 22–32)
Calcium: 9 mg/dL (ref 8.9–10.3)
Chloride: 101 mmol/L (ref 98–111)
Creatinine, Ser: 0.91 mg/dL (ref 0.61–1.24)
GFR, Estimated: >60 mL/min (ref >60)
Glucose, Bld: 101 mg/dL — ABNORMAL HIGH (ref 70–99)
Potassium: 4.1 mmol/L (ref 3.5–5.1)
Sodium: 136 mmol/L (ref 135–145)

## 2020-11-13 SURGERY — VIDEO BRONCHOSCOPY WITH ENDOBRONCHIAL NAVIGATION
Anesthesia: General | Laterality: Right

## 2020-11-13 MED ORDER — ROCURONIUM BROMIDE 10 MG/ML (PF) SYRINGE
PREFILLED_SYRINGE | INTRAVENOUS | Status: DC | PRN
  Administered 2020-11-13 (×2): 20 mg via INTRAVENOUS

## 2020-11-13 MED ORDER — SUCCINYLCHOLINE CHLORIDE 200 MG/10ML IV SOSY
PREFILLED_SYRINGE | INTRAVENOUS | Status: DC | PRN
  Administered 2020-11-13: 160 mg via INTRAVENOUS

## 2020-11-13 MED ORDER — LACTATED RINGERS IV SOLN: INTRAVENOUS | Status: DC

## 2020-11-13 MED ORDER — PHENYLEPHRINE 40 MCG/ML (10ML) SYRINGE FOR IV PUSH (FOR BLOOD PRESSURE SUPPORT)
PREFILLED_SYRINGE | INTRAVENOUS | Status: DC | PRN
  Administered 2020-11-13 (×2): 80 ug via INTRAVENOUS

## 2020-11-13 MED ORDER — ONDANSETRON HCL 4 MG/2ML IJ SOLN
INTRAMUSCULAR | Status: DC | PRN
  Administered 2020-11-13: 4 mg via INTRAVENOUS

## 2020-11-13 MED ORDER — PHENYLEPHRINE HCL-NACL 20-0.9 MG/250ML-% IV SOLN
INTRAVENOUS | Status: DC | PRN
  Administered 2020-11-13: 50 ug/min via INTRAVENOUS

## 2020-11-13 MED ORDER — FENTANYL CITRATE (PF) 100 MCG/2ML IJ SOLN
INTRAMUSCULAR | Status: DC | PRN
  Administered 2020-11-13: 100 ug via INTRAVENOUS

## 2020-11-13 MED ORDER — DEXAMETHASONE SODIUM PHOSPHATE 4 MG/ML IJ SOLN
INTRAMUSCULAR | Status: DC | PRN
  Administered 2020-11-13: 6 mg via INTRAVENOUS

## 2020-11-13 MED ORDER — CHLORHEXIDINE GLUCONATE 0.12 % MT SOLN
15.0000 mL | Freq: Once | OROMUCOSAL | Status: AC
  Filled 2020-11-13: qty 15

## 2020-11-13 MED ORDER — GLYCOPYRROLATE 0.2 MG/ML IJ SOLN
INTRAMUSCULAR | Status: DC | PRN
  Administered 2020-11-13: .1 mg via INTRAVENOUS

## 2020-11-13 MED ORDER — LIDOCAINE 2% (20 MG/ML) 5 ML SYRINGE
INTRAMUSCULAR | Status: DC | PRN
  Administered 2020-11-13: 60 mg via INTRAVENOUS

## 2020-11-13 MED ORDER — SUGAMMADEX SODIUM 200 MG/2ML IV SOLN
INTRAVENOUS | Status: DC | PRN
  Administered 2020-11-13: 200 mg via INTRAVENOUS

## 2020-11-13 MED ORDER — EPHEDRINE SULFATE-NACL 50-0.9 MG/10ML-% IV SOSY
PREFILLED_SYRINGE | INTRAVENOUS | Status: DC | PRN
  Administered 2020-11-13: 10 mg via INTRAVENOUS
  Administered 2020-11-13: 5 mg via INTRAVENOUS

## 2020-11-13 MED ORDER — CHLORHEXIDINE GLUCONATE 0.12 % MT SOLN
OROMUCOSAL | Status: AC
  Administered 2020-11-13: 15 mL via OROMUCOSAL
  Filled 2020-11-13: qty 15

## 2020-11-13 MED ORDER — PROPOFOL 10 MG/ML IV BOLUS
INTRAVENOUS | Status: DC | PRN
  Administered 2020-11-13: 150 mg via INTRAVENOUS

## 2020-11-13 SURGICAL SUPPLY — 45 items
ADAPTER BRONCH F/PENTAX (ADAPTER) ×3 IMPLANT
ADAPTER VALVE BIOPSY EBUS (MISCELLANEOUS) IMPLANT
ADPTR VALVE BIOPSY EBUS (MISCELLANEOUS)
BRUSH CYTOL CELLEBRITY 1.5X140 (MISCELLANEOUS) ×3 IMPLANT
BRUSH SUPERTRAX BIOPSY (INSTRUMENTS) IMPLANT
BRUSH SUPERTRAX NDL-TIP CYTO (INSTRUMENTS) ×3 IMPLANT
CANISTER SUCT 3000ML PPV (MISCELLANEOUS) ×3 IMPLANT
CHANNEL WORK EXTEND EDGE 180 (KITS) IMPLANT
CHANNEL WORK EXTEND EDGE 45 (KITS) IMPLANT
CHANNEL WORK EXTEND EDGE 90 (KITS) IMPLANT
CONT SPEC 4OZ CLIKSEAL STRL BL (MISCELLANEOUS) ×3 IMPLANT
COVER BACK TABLE 60X90IN (DRAPES) ×3 IMPLANT
FILTER STRAW FLUID ASPIR (MISCELLANEOUS) IMPLANT
FORCEPS BIOP SUPERTRX PREMAR (INSTRUMENTS) ×3 IMPLANT
GAUZE SPONGE 4X4 12PLY STRL (GAUZE/BANDAGES/DRESSINGS) ×3 IMPLANT
GLOVE SURG SS PI 7.5 STRL IVOR (GLOVE) ×6 IMPLANT
GOWN STRL REUS W/ TWL LRG LVL3 (GOWN DISPOSABLE) ×4 IMPLANT
GOWN STRL REUS W/TWL LRG LVL3 (GOWN DISPOSABLE) ×2
KIT CLEAN ENDO COMPLIANCE (KITS) ×3 IMPLANT
KIT LOCATABLE GUIDE (CANNULA) IMPLANT
KIT MARKER FIDUCIAL DELIVERY (KITS) IMPLANT
KIT PROCEDURE EDGE 180 (KITS) IMPLANT
KIT PROCEDURE EDGE 45 (KITS) IMPLANT
KIT PROCEDURE EDGE 90 (KITS) IMPLANT
KIT TURNOVER KIT B (KITS) ×3 IMPLANT
MARKER SKIN DUAL TIP RULER LAB (MISCELLANEOUS) ×3 IMPLANT
NEEDLE SUPERTRX PREMARK BIOPSY (NEEDLE) ×3 IMPLANT
NS IRRIG 1000ML POUR BTL (IV SOLUTION) ×3 IMPLANT
OIL SILICONE PENTAX (PARTS (SERVICE/REPAIRS)) ×3 IMPLANT
PAD ARMBOARD 7.5X6 YLW CONV (MISCELLANEOUS) ×6 IMPLANT
PATCHES PATIENT (LABEL) ×9 IMPLANT
SOL ANTI FOG 6CC (MISCELLANEOUS) ×2 IMPLANT
SOLUTION ANTI FOG 6CC (MISCELLANEOUS) ×1
SYR 20CC LL (SYRINGE) ×3 IMPLANT
SYR 20ML ECCENTRIC (SYRINGE) ×3 IMPLANT
SYR 50ML SLIP (SYRINGE) ×3 IMPLANT
TOWEL OR 17X24 6PK STRL BLUE (TOWEL DISPOSABLE) ×3 IMPLANT
TRAP SPECIMEN MUCOUS 40CC (MISCELLANEOUS) IMPLANT
TUBE CONNECTING 20X1/4 (TUBING) ×3 IMPLANT
UNDERPAD 30X30 (UNDERPADS AND DIAPERS) ×3 IMPLANT
VALVE BIOPSY  SINGLE USE (MISCELLANEOUS) ×1
VALVE BIOPSY SINGLE USE (MISCELLANEOUS) ×2 IMPLANT
VALVE SUCTION BRONCHIO DISP (MISCELLANEOUS) ×3 IMPLANT
WATER STERILE IRR 1000ML POUR (IV SOLUTION) ×3 IMPLANT
superlock fiducial marker ×3 IMPLANT

## 2020-11-13 NOTE — Anesthesia Procedure Notes (Signed)
Procedure Name: Intubation Date/Time: 11/13/2020 11:03 AM Performed by: Lieutenant Diego, CRNA Pre-anesthesia Checklist: Patient identified, Emergency Drugs available, Suction available and Patient being monitored Patient Re-evaluated:Patient Re-evaluated prior to induction Oxygen Delivery Method: Circle system utilized Preoxygenation: Pre-oxygenation with 100% oxygen Induction Type: IV induction Ventilation: Mask ventilation without difficulty Laryngoscope Size: Miller and 2 Grade View: Grade I Tube type: Oral Tube size: 8.5 mm Number of attempts: 1 Airway Equipment and Method: Stylet Placement Confirmation: ETT inserted through vocal cords under direct vision, positive ETCO2 and breath sounds checked- equal and bilateral Secured at: 25 cm Tube secured with: Tape Dental Injury: Teeth and Oropharynx as per pre-operative assessment

## 2020-11-13 NOTE — Interval H&P Note (Signed)
History and Physical Interval Note:  11/13/2020 10:42 AM  Kevin Brooks  has presented today for surgery, with the diagnosis of lung nodule.  The various methods of treatment have been discussed with the patient and family. After consideration of risks, benefits and other options for treatment, the patient has consented to  Procedure(s) with comments: [place] (Right) - ION w/ fiducial placement as a surgical intervention.  The patient's history has been reviewed, patient examined, no change in status, stable for surgery.  I have reviewed the patient's chart and labs.  Questions were answered to the patient's satisfaction.     [place]

## 2020-11-13 NOTE — Discharge Instructions (Signed)
Flexible Bronchoscopy, Care After This sheet gives you information about how to care for yourself after your test. Your doctor may also give you more specific instructions. If you have problems or questions, contact your doctor. Follow these instructions at home: Eating and drinking Do not eat or drink anything (not even water) for 2 hours after your test, or until your numbing medicine (local anesthetic) wears off. When your numbness is gone and your cough and gag reflexes have come back, you may: Eat only soft foods. Slowly drink liquids. The day after the test, go back to your normal diet. Driving Do not drive for 24 hours if you were given a medicine to help you relax (sedative). Do not drive or use heavy machinery while taking prescription pain medicine. General instructions  Take over-the-counter and prescription medicines only as told by your doctor. Return to your normal activities as told. Ask what activities are safe for you. Do not use any products that have nicotine or tobacco in them. This includes cigarettes and e-cigarettes. If you need help quitting, ask your doctor. Keep all follow-up visits as told by your doctor. This is important. It is very important if you had a tissue sample (biopsy) taken. Get help right away if: You have shortness of breath that gets worse. You get light-headed. You feel like you are going to pass out (faint). You have chest pain. You cough up: More than a little blood. More blood than before. Summary Do not eat or drink anything (not even water) for 2 hours after your test, or until your numbing medicine wears off. Do not use cigarettes. Do not use e-cigarettes. Get help right away if you have chest pain.  This information is not intended to replace advice given to you by your health care provider. Make sure you discuss any questions you have with your health care provider. Document Released: 10/27/2008 Document Revised: 12/12/2016 Document  Reviewed: 01/18/2016 Elsevier Patient Education  2020 [employer].

## 2020-11-13 NOTE — Transfer of Care (Signed)
Immediate Anesthesia Transfer of Care Note  Patient: Kevin Brooks  Procedure(s) Performed: VIDEO BRONCHOSCOPY WITH ENDOBRONCHIAL NAVIGATION (Right) BRONCHIAL BIOPSIES BRONCHIAL BRUSHINGS BRONCHIAL NEEDLE ASPIRATION BIOPSIES RADIAL ENDOBRONCHIAL ULTRASOUND  Patient Location: PACU  Anesthesia Type:General  Level of Consciousness: awake  Airway & Oxygen Therapy: Patient Spontanous Breathing and Patient connected to face mask oxygen  Post-op Assessment: Report given to RN and Post -op Vital signs reviewed and stable  Post vital signs: Reviewed and stable  Last Vitals:  Vitals Value Taken Time  BP 164/84 11/13/20 1221  Temp 36.1 C 11/13/20 1221  Pulse 78 11/13/20 1229  Resp 17 11/13/20 1229  SpO2 95 % 11/13/20 1229  Vitals shown include unvalidated device data.  Last Pain:  Vitals:   11/13/20 1221  TempSrc:   PainSc: 0-No pain      Patients Stated Pain Goal: 6 (10/20/10 1975)  Complications: No notable events documented.

## 2020-11-13 NOTE — Op Note (Signed)
Video Bronchoscopy with Robotic Assisted Bronchoscopic Navigation   Date of Operation: 11/13/2020   Pre-op Diagnosis: Right lower lobe nodule  Post-op Diagnosis: Right lower lobe  Surgeon: Garner Nash, DO   Assistants: None   Anesthesia: General endotracheal anesthesia  Operation: Flexible video fiberoptic bronchoscopy with robotic assistance and biopsies.  Estimated Blood Loss: Minimal  Complications: None  Indications and History: Kevin Brooks is a 75 y.o. male with history of right lower lobe nodule. The risks, benefits, complications, treatment options and expected outcomes were discussed with the patient.  The possibilities of pneumothorax, pneumonia, reaction to medication, pulmonary aspiration, perforation of a viscus, bleeding, failure to diagnose a condition and creating a complication requiring transfusion or operation were discussed with the patient who freely signed the consent.    Description of Procedure: The patient was seen in the Preoperative Area, was examined and was deemed appropriate to proceed.  The patient was taken to [facility] endoscopy room 3, identified as Kevin Brooks and the procedure verified as Flexible Video Fiberoptic Bronchoscopy.  A Time Out was held and the above information confirmed.   Prior to the date of the procedure a high-resolution CT scan of the chest was performed. Utilizing ION software program a virtual tracheobronchial tree was generated to allow the creation of distinct navigation pathways to the patient's parenchymal abnormalities. After being taken to the operating room general anesthesia was initiated and the patient  was orally intubated. The video fiberoptic bronchoscope was introduced via the endotracheal tube and a general inspection was performed which showed normal right and left lung anatomy, aspiration of the bilateral mainstems was completed to remove any remaining secretions. Robotic catheter inserted into patient's  endotracheal tube.   Target #1 right lower lobe nodule: The distinct navigation pathways prepared prior to this procedure were then utilized to navigate to patient's lesion identified on CT scan. The robotic catheter was secured into place and the vision probe was withdrawn.  Lesion location was approximated using fluoroscopy and radial endobronchial ultrasound for peripheral targeting.  Using mobile 3D CBCT imaging the lesion was identified and catheter direction was confirmed towards lesion.  Please see image below confirming needle pass and concerning the right lower lobe nodule.  Under fluoroscopic guidance transbronchial needle brushings, transbronchial needle biopsies, and transbronchial forceps biopsies were performed to be sent for cytology and pathology.  Following tissue sampling a single fiducial was placed approximately 2 cm from the defined lesion with a fiducial catheter wire delivery kit.  At the end of the procedure a general airway inspection was performed and there was no evidence of active bleeding. The bronchoscope was removed.  The patient tolerated the procedure well. There was no significant blood loss and there were no obvious complications. A post-procedural chest x-ray is pending.  Samples Target #1: 1. Transbronchial needle brushings from RLL 2. Transbronchial Wang needle biopsies from RLL 3. Transbronchial forceps biopsies from RLL  Plans:  The patient will be discharged from the PACU to home when recovered from anesthesia and after chest x-ray is reviewed. We will review the cytology, pathology and microbiology results with the patient when they become available. Outpatient followup will be with Leory Plowman L Marny Smethers DO.     Garner Nash, DO [facility] 11/13/2020 12:15 PM

## 2020-11-13 NOTE — Anesthesia Preprocedure Evaluation (Addendum)
Anesthesia Evaluation  Patient identified by MRN, date of birth, ID band Patient awake    Reviewed: Allergy & Precautions, NPO status , Patient's Chart, lab work & pertinent test results  History of Anesthesia Complications Negative for: history of anesthetic complications  Airway Mallampati: I  TM Distance: >3 FB Neck ROM: Full    Dental  (+) Dental Advisory Given, Teeth Intact, Caps   Pulmonary COPD,  COPD inhaler, Current Smoker and Patient abstained from smoking.,    Pulmonary exam normal        Cardiovascular hypertension, Pt. on medications Normal cardiovascular exam     Neuro/Psych PSYCHIATRIC DISORDERS Depression TIA   GI/Hepatic Neg liver ROS, GERD  Medicated and Controlled,  Endo/Other  negative endocrine ROS  Renal/GU negative Renal ROS     Musculoskeletal negative musculoskeletal ROS (+)   Abdominal   Peds  Hematology  On plavix    Anesthesia Other Findings Covid test negative   Reproductive/Obstetrics                            Anesthesia Physical  Anesthesia Plan  ASA: 3  Anesthesia Plan: General   Post-op Pain Management:    Induction: Intravenous  PONV Risk Score and Plan: 2 and Treatment may vary due to age or medical condition  Airway Management Planned: Oral ETT  Additional Equipment: None  Intra-op Plan:   Post-operative Plan: Extubation in OR  Informed Consent: I have reviewed the patients History and Physical, chart, labs and discussed the procedure including the risks, benefits and alternatives for the proposed anesthesia with the patient or authorized representative who has indicated his/her understanding and acceptance.       Plan Discussed with: CRNA and Anesthesiologist  Anesthesia Plan Comments: (  )        Anesthesia Quick Evaluation

## 2020-11-13 NOTE — Anesthesia Postprocedure Evaluation (Signed)
Anesthesia Post Note  Patient: Kevin Brooks  Procedure(s) Performed: VIDEO BRONCHOSCOPY WITH ENDOBRONCHIAL NAVIGATION (Right) BRONCHIAL BIOPSIES BRONCHIAL BRUSHINGS BRONCHIAL NEEDLE ASPIRATION BIOPSIES RADIAL ENDOBRONCHIAL ULTRASOUND     Patient location during evaluation: PACU Anesthesia Type: General Level of consciousness: awake and alert Pain management: pain level controlled Vital Signs Assessment: post-procedure vital signs reviewed and stable Respiratory status: spontaneous breathing, nonlabored ventilation, respiratory function stable and patient connected to nasal cannula oxygen Cardiovascular status: blood pressure returned to baseline and stable Postop Assessment: no apparent nausea or vomiting Anesthetic complications: no   No notable events documented.  Last Vitals:  Vitals:   11/13/20 1236 11/13/20 1251  BP: 124/75 131/70  Pulse: 79 67  Resp: 15 16  Temp:  36.6 C  SpO2: 95% 99%    Last Pain:  Vitals:   11/13/20 1251  TempSrc:   PainSc: 0-No pain                 [name]

## 2020-11-14 LAB — CYTOLOGY - NON PAP

## 2020-11-15 ENCOUNTER — Encounter (HOSPITAL_COMMUNITY): Payer: Self-pay | Admitting: Pulmonary Disease

## 2020-11-19 ENCOUNTER — Encounter: Payer: Self-pay | Admitting: Acute Care

## 2020-11-19 ENCOUNTER — Other Ambulatory Visit: Payer: Self-pay

## 2020-11-19 ENCOUNTER — Ambulatory Visit: Payer: Medicare Other | Admitting: Acute Care

## 2020-11-19 VITALS — BP 142/80 | HR 74 | Temp 97.6°F | Ht 67.5 in | Wt 126.2 lb

## 2020-11-19 DIAGNOSIS — J432 Centrilobular emphysema: Secondary | ICD-10-CM

## 2020-11-19 DIAGNOSIS — D7389 Other diseases of spleen: Secondary | ICD-10-CM | POA: Diagnosis not present

## 2020-11-19 DIAGNOSIS — C3491 Malignant neoplasm of unspecified part of right bronchus or lung: Secondary | ICD-10-CM

## 2020-11-19 DIAGNOSIS — Z720 Tobacco use: Secondary | ICD-10-CM | POA: Diagnosis not present

## 2020-11-19 DIAGNOSIS — C3431 Malignant neoplasm of lower lobe, right bronchus or lung: Secondary | ICD-10-CM | POA: Diagnosis not present

## 2020-11-19 DIAGNOSIS — J449 Chronic obstructive pulmonary disease, unspecified: Secondary | ICD-10-CM

## 2020-11-19 NOTE — Progress Notes (Signed)
History of Present Illness [name] is a 75 y.o. male with  history of right lower lobe nodule, followed by Dr. Valeta Harms for bronchoscopy 11/13/2020. ( Adenocarcinoma) .   11/19/2020 Pt. Presents for follow up. He states he has been  doing well after his video bronchoscopy with robotic assisted bronchoscopic navigation  done 11/13/2020 to a right lower lobe nodule . Procedure was done by Dr. Valeta Harms. Cytology  from both the RLL fine needle aspiration and the RLL brushing are positive for adenocarcinoma. We discussed that next steps are for referral to general surgery to evaluate his spleen, which was PET avid  on PET scan. If sleen is negative for metastasis, plan would be for removal of the pulmonary nodule. If it is + for metastasis, he will follow up with surgery/ oncology to determine best plan. He has an appointment scheduled for 11/27/2020 with [facility] to evaluate his spleen.He was not aware a referral had been made to [place] , and received a phone call which took him by surprise. This was upsetting to him. He also has referral to Dr. Crisoforo Oxford oncology at [facility] 11/15. This referral was made by Dr. Sandi Mariscal, his PCP.  Pt. States he has had weight loss from 130 to 122 pounds, despite a really good appetite. He has recently moved into [facility] assisted living. He states he had a sore throat after the procedure, and minimal hemoptysis post procedure. He denies fever, chest pain, or orthopnea.   Test Results: 11/13/2020 >>. Robotic Assister Navigational Bronchoscopy LUNG, RLL, FINE NEEDLE ASPIRATION:  - Malignant cells consistent with adenocarcinoma   B. LUNG, RLL, BRUSHING:  - Malignant cells consistent with adenocarcinoma   COMMENT:  There is likely sufficient material for limited molecular testing.    11/09/2020 Super D CT Chest 16 x 15 mm irregular right lower lobe pulmonary nodule is slightly larger and appears slightly more solid on today's examination  when compared to the prior studies. 2. Stable appearing splenic lesion which was hypermetabolic on prior PET-CT. 3. Stable advanced emphysematous changes and pulmonary scarring. 4. Stable right apical pleural and parenchymal scarring changes with calcification. 5. Stable calcified mediastinal and hilar lymph nodes. 6. Stable advanced atherosclerotic calcifications involving the aorta and branch vessels including the coronary arteries.  Chest Imaging: 09/26/2020: Nuclear medicine pet imaging reveals a subsolid pulmonary nodule within the right right lower lobe at 1.4 cm low-level SUV concerning for malignancy. Also splenic lesion which is hypermetabolic concerning for lymphoma versus metastasis. The patient's images have been independently reviewed by me.       Pulmonary Functions Testing Results: PFT Results Latest Ref Rng & Units 09/13/2020  FVC-Pre L 2.85  FVC-Predicted Pre % 71  FVC-Post L 3.35  FVC-Predicted Post % 84  Pre FEV1/FVC % % 50  Post FEV1/FCV % % 48  FEV1-Pre L 1.42  FEV1-Predicted Pre % 49  FEV1-Post L 1.60  DLCO uncorrected ml/min/mmHg 14.46  DLCO UNC% % 60  DLCO corrected ml/min/mmHg 14.46  DLCO COR %Predicted % 60  DLVA Predicted % 57  TLC L 8.20  TLC % Predicted % 123  RV % Predicted % 191         CBC Latest Ref Rng & Units 05/29/2014  WBC 4.0 - 10.5 K/uL 7.3  Hemoglobin 13.0 - 17.0 g/dL 17.0  Hematocrit 39.0 - 52.0 % 47.1  Platelets 150 - 400 K/uL 152    BMP Latest Ref Rng & Units 11/13/2020  Glucose 70 -  99 mg/dL 101(H)  BUN 8 - 23 mg/dL 13  Creatinine 0.61 - 1.24 mg/dL 0.91  Sodium 135 - 145 mmol/L 136  Potassium 3.5 - 5.1 mmol/L 4.1  Chloride 98 - 111 mmol/L 101  CO2 22 - 32 mmol/L 30  Calcium 8.9 - 10.3 mg/dL 9.0    BNP No results found for: BNP  ProBNP No results found for: PROBNP  PFT    Component Value Date/Time   FEV1PRE 1.42 09/13/2020 1103   FEV1POST 1.60 09/13/2020 1103   FVCPRE 2.85 09/13/2020 1103   FVCPOST 3.35  09/13/2020 1103   TLC 8.20 09/13/2020 1103   DLCOUNC 14.46 09/13/2020 1103   PREFEV1FVCRT 50 09/13/2020 1103   PSTFEV1FVCRT 48 09/13/2020 1103    DG CHEST PORT 1 VIEW  Result Date: 11/13/2020 CLINICAL DATA:  Status post bronch in a 75 year old male. EXAM: PORTABLE CHEST 1 VIEW COMPARISON:  January 12, 2019 FINDINGS: EKG leads project over the chest. Image rotated the LEFT. Accounting for this cardiomediastinal contours and hilar structures are stable. Small nodule in the RIGHT lower lobe is not well visualized. There is a fiducial marker that has been placed at the RIGHT lung base likely due interval bronchoscopy. No lobar consolidative process. No edema or pleural effusion.  No sign of pneumothorax. On limited assessment there is no acute skeletal process. IMPRESSION: Interval bronchoscopy and fiducial marker placement at the RIGHT lung base. No pneumothorax or pleural effusion. Small nodule in the RIGHT lower lobe is not well visualized. Electronically Signed   By: Zetta Bills M.D.   On: 11/13/2020 12:54   CT Super D Chest Wo Contrast  Result Date: 11/12/2020 CLINICAL DATA:  Super D chest for or scheduled bronchoscopy. EXAM: CT CHEST WITHOUT CONTRAST TECHNIQUE: Multidetector CT imaging of the chest was performed using thin slice collimation for electromagnetic bronchoscopy planning purposes, without intravenous contrast. COMPARISON:  PET-CT 09/26/2020 and chest CT 09/04/2020 FINDINGS: Cardiovascular: The heart is normal in size. No pericardial effusion. Stable aortic and coronary artery calcifications. Mediastinum/Nodes: Stable scattered calcified mediastinal hilar lymph nodes. No mass or overt adenopathy. The esophagus is grossly normal. Lungs/Pleura: Stable right apical pleural and parenchymal scarring changes with calcification. Stable advanced emphysematous changes and significant pulmonary scarring. Irregular 16 x 15 mm nodule in the right lower lobe is slightly larger and appears slightly  more solid on today's examination when compared to the prior studies. Stable right upper lobe calcified granuloma. Upper Abdomen: Stable appearing 14.5 mm lateral splenic nodule which was hypermetabolic the prior PET-CT scan. Musculoskeletal: No chest wall mass, supraclavicular or axillary adenopathy. The bony thorax is intact. IMPRESSION: 1. 16 x 15 mm irregular right lower lobe pulmonary nodule is slightly larger and appears slightly more solid on today's examination when compared to the prior studies. 2. Stable appearing splenic lesion which was hypermetabolic on prior PET-CT. 3. Stable advanced emphysematous changes and pulmonary scarring. 4. Stable right apical pleural and parenchymal scarring changes with calcification. 5. Stable calcified mediastinal and hilar lymph nodes. 6. Stable advanced atherosclerotic calcifications involving the aorta and branch vessels including the coronary arteries. Aortic Atherosclerosis (ICD10-I70.0) and Emphysema (ICD10-J43.9). Aortic Atherosclerosis (ICD10-I70.0) and Emphysema (ICD10-J43.9). Electronically Signed   By: Marijo Sanes M.D.   On: 11/12/2020 10:52   DG C-ARM BRONCHOSCOPY  Result Date: 11/13/2020 C-ARM BRONCHOSCOPY: Fluoroscopy was utilized by the requesting physician.  No radiographic interpretation.     Past medical hx Past Medical History:  Diagnosis Date   BPH (benign prostatic hyperplasia)    Cataracta  COPD (chronic obstructive pulmonary disease) (HCC)    Depression    ED (erectile dysfunction)    GERD (gastroesophageal reflux disease)    Hypertension    Hypogonadism male    Mixed hyperlipidemia    Palpitations    TIA (transient ischemic attack)    Vision abnormalities      Social History   Tobacco Use   Smoking status: Every Day    Packs/day: 1.00    Types: Cigarettes   Smokeless tobacco: Never   Tobacco comments:    Smokes 1 pack a day ARJ 11/19/20  Vaping Use   Vaping Use: Every day  Substance Use Topics   Alcohol use:  Yes    Alcohol/week: 1.0 standard drink    Types: 1 Cans of beer per week    Comment: beer daily   Drug use: No    Kevin Brooks reports that he has been smoking cigarettes. He has been smoking an average of 1 pack per day. He has never used smokeless tobacco. He reports current alcohol use of about 1.0 standard drink per week. He reports that he does not use drugs.  Tobacco Cessation: Current Every Day smoker, Smokes 1 pack per day. Counseled to quit  Past surgical hx, Family hx, Social hx all reviewed.  Current Outpatient Medications on File Prior to Visit  Medication Sig   albuterol (VENTOLIN HFA) 108 (90 Base) MCG/ACT inhaler Inhale 2 puffs into the lungs every 6 (six) hours as needed for wheezing or shortness of breath.   cholecalciferol (VITAMIN D3) 25 MCG (1000 UNIT) tablet Take 1,000 Units by mouth daily.   citalopram (CELEXA) 40 MG tablet Take 40 mg by mouth daily.   clopidogrel (PLAVIX) 75 MG tablet Take 75 mg by mouth daily.   Fluticasone-Umeclidin-Vilant (TRELEGY ELLIPTA) 100-62.5-25 MCG/INH AEPB Inhale 1 puff into the lungs daily.   losartan (COZAAR) 100 MG tablet Take 100 mg by mouth every morning.   omeprazole (PRILOSEC) 20 MG capsule Take 20 mg by mouth daily.   PRESCRIPTION MEDICATION Apply 1 mL topically daily. Testosterone Lipoderm 20% compounded at [facility] - apply to stomach or chest   rosuvastatin (CRESTOR) 5 MG tablet Take 5 mg by mouth daily.   tadalafil (CIALIS) 5 MG tablet Take 5 mg by mouth at bedtime.   tamsulosin (FLOMAX) 0.4 MG CAPS capsule Take 0.4 mg by mouth at bedtime.   No current facility-administered medications on file prior to visit.     No Known Allergies  Review Of Systems:  Constitutional:   +  weight loss, No night sweats,  Fevers, chills, fatigue, or  lassitude.  HEENT:   No headaches,  Difficulty swallowing,  Tooth/dental problems, or  Sore throat,                No sneezing, itching, ear ache, nasal congestion, post nasal  drip,   CV:  No chest pain,  Orthopnea, PND, swelling in lower extremities, anasarca, dizziness, palpitations, syncope.   GI  No heartburn, indigestion, abdominal pain, nausea, vomiting, diarrhea, change in bowel habits, loss of appetite, bloody stools.   Resp: No shortness of breath with exertion or at rest.  No excess mucus, no productive cough,  No non-productive cough,  No coughing up of blood.  No change in color of mucus.  No wheezing.  No chest wall deformity  Skin: no rash or lesions.  GU: no dysuria, change in color of urine, no urgency or frequency.  No flank pain, no hematuria  MS:  No joint pain or swelling.  No decreased range of motion.  No back pain.  Psych:  No change in mood or affect. No depression or anxiety.  No memory loss.   Vital Signs BP (!) 142/80 (BP Location: Left Arm, Patient Position: Sitting, Cuff Size: Normal)   Pulse 74   Temp 97.6 F (36.4 C) (Oral)   Ht 5' 7.5" (1.715 m)   Wt 126 lb 3.2 oz (57.2 kg)   SpO2 100%   BMI 19.47 kg/m    Physical Exam:  General- No distress,  A&O x 3, very pleasant ENT: No sinus tenderness, TM clear, pale nasal mucosa, no oral exudate,no post nasal drip, no LAN Cardiac: S1, S2, regular rate and rhythm, no murmur Chest: No wheeze/ rales/ dullness; no accessory muscle use, no nasal flaring, no sternal retractions Abd.: Soft Non-tender, ND, BS +, Body mass index is 19.47 kg/m.  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 [place] [facility] [place] [facility]phy[facility] Plan Continue Trelegy Work on quitting smoking   Ongoing Tobacco Abuse Plan Smoking cessation  I spent 35 minutes dedicated to the care of this patient on the date of this encounter to include pre-visit review of records, face-to-face time with the patient discussing conditions above, post visit ordering of testing, clinical documentation with the electronic health record, making appropriate referrals as documented, and communicating necessary information to the patient's healthcare team.   Magdalen Spatz, NP 11/19/2020  9:59 AM

## 2020-11-19 NOTE — Patient Instructions (Addendum)
It is good to see you today.  Your biopsy result shows adenocarcinoma of the lung. Follow up with [facility] to evaluate the Splenic lesion noted on the PET scan as is scheduled 11/15. .  Based on findings regarding your spleen, plan with be developed on management of the lung cancer. Follow up with Dr. Crisoforo Oxford oncology [facility] 11/15, as is scheduled.  Follow up with Dr. Valeta Harms in 3 months, or sooner if needed.  Please work on quitting smoking Please contact office for sooner follow up if symptoms do not improve or worsen or seek emergency care

## 2020-11-21 ENCOUNTER — Ambulatory Visit: Payer: Medicare Other | Admitting: Pulmonary Disease

## 2020-11-28 ENCOUNTER — Other Ambulatory Visit: Payer: Self-pay | Admitting: Surgery

## 2020-11-28 ENCOUNTER — Telehealth: Payer: Self-pay | Admitting: Pulmonary Disease

## 2020-11-28 DIAGNOSIS — D7389 Other diseases of spleen: Secondary | ICD-10-CM

## 2020-11-28 DIAGNOSIS — C3491 Malignant neoplasm of unspecified part of right bronchus or lung: Secondary | ICD-10-CM
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
## 2020-11-30 ENCOUNTER — Ambulatory Visit: Payer: Medicare Other | Admitting: Thoracic Surgery (Cardiothoracic Vascular Surgery)

## 2020-11-30 ENCOUNTER — Other Ambulatory Visit: Payer: Self-pay

## 2020-11-30 ENCOUNTER — Ambulatory Visit
Admission: RE | Admit: 2020-11-30 | Discharge: 2020-11-30 | Disposition: A | Discharge status: Home / Self Care | Payer: Medicare Other | Source: Ambulatory Visit | Attending: Surgery | Admitting: Surgery

## 2020-11-30 VITALS — BP 168/93 | HR 79 | Resp 20 | Ht 67.0 in | Wt 126.0 lb

## 2020-11-30 DIAGNOSIS — D7389 Other diseases of spleen: Secondary | ICD-10-CM

## 2020-11-30 DIAGNOSIS — R911 Solitary pulmonary nodule: Secondary | ICD-10-CM | POA: Diagnosis not present

## 2020-11-30 MED ORDER — IOPAMIDOL (ISOVUE-300) INJECTION 61%
100.0000 mL | Freq: Once | INTRAVENOUS | Status: AC | PRN
  Administered 2020-11-30: 100 mL via INTRAVENOUS

## 2020-11-30 NOTE — Progress Notes (Signed)
[address]       [place] [address]             [phone]                    [name] [facility] Medical Record #[id number] Date of Birth: 10-25-1945  Referring: Garner Nash, DO Primary Care: Derinda Late, MD Primary Cardiologist: None  Chief Complaint:    Chief Complaint  Patient presents with   Lung Lesion    Surgical consult, PET Scan 09/26/20, Chest CT 11/09/20, Bronch 11/13/20, PFT's 09/13/20    History of Present Illness:    Kevin Brooks 75 y.o. male referred by Dr. Valeta Harms for surgical evaluation of a right lower lobe biopsy-proven adenocarcinoma.  He originally was followed in the lung cancer screening program given his significant smoking history, and was noted to have a 16 mm right lower lobe pulmonary nodule.  He subsequently underwent navigational bronchoscopy which showed adenocarcinoma.  He denies any shortness of breath or neurologic symptoms.  He does continue to smoke about half a pack a day.  His weight has been stable.  He remains very active and walks daily.      Zubrod Score: At the time of surgery this patient's most appropriate activity status/level should be described as: [x]     0    Normal activity, no symptoms []     1    Restricted in physical strenuous activity but ambulatory, able to do out light work []     2    Ambulatory and capable of self care, unable to do work activities, up and about               >50 % of waking hours                              []     3    Only limited self care, in bed greater than 50% of waking hours []     4    Completely disabled, no self care, confined to bed or chair []     5    Moribund   Past Medical History:  Diagnosis Date   BPH (benign prostatic hyperplasia)    Cataracta    COPD (chronic obstructive pulmonary disease) ([place])    Depression    ED (erectile dysfunction)    GERD (gastroesophageal reflux disease)    Hypertension    Hypogonadism male    Mixed hyperlipidemia     Palpitations    TIA (transient ischemic attack)    Vision abnormalities     Past Surgical History:  Procedure Laterality Date    catarcts surgery     BRONCHIAL BIOPSY  11/13/2020   Procedure: BRONCHIAL BIOPSIES;  Surgeon: Garner Nash, DO;  Location: [facility];  Service: Pulmonary;;   BRONCHIAL BRUSHINGS  11/13/2020   Procedure: BRONCHIAL BRUSHINGS;  Surgeon: Garner Nash, DO;  Location: [facility];  Service: Pulmonary;;   BRONCHIAL NEEDLE ASPIRATION BIOPSY  11/13/2020   Procedure: BRONCHIAL NEEDLE ASPIRATION BIOPSIES;  Surgeon: Garner Nash, DO;  Location: [facility];  Service: Pulmonary;;   COLONOSCOPY     COLONOSCOPY WITH PROPOFOL N/A 03/16/2020   Procedure: COLONOSCOPY WITH PROPOFOL;  Surgeon: Carol Ada, MD;  Location: [facility]COPY;  Service: Endoscopy;  Laterality: N/A;   ENTEROSCOPY N/A 03/16/2020   Procedure: ENTEROSCOPY;  Surgeon: Carol Ada, MD;  Location:  [facility]OSCOPY;  Service: Endoscopy;  Laterality: N/A;   HEMOSTASIS CLIP PLACEMENT  03/16/2020   Procedure: HEMOSTASIS CLIP PLACEMENT;  Surgeon: Carol Ada, MD;  Location: [facility]OSCOPY;  Service: Endoscopy;;   HOT HEMOSTASIS N/A 03/16/2020   Procedure: HOT HEMOSTASIS (ARGON PLASMA COAGULATION/BICAP);  Surgeon: Carol Ada, MD;  Location: [facility];  Service: Endoscopy;  Laterality: N/A;   POLYPECTOMY  03/16/2020   Procedure: POLYPECTOMY;  Surgeon: Carol Ada, MD;  Location: [facility];  Service: Endoscopy;;   VIDEO BRONCHOSCOPY WITH ENDOBRONCHIAL NAVIGATION Right 11/13/2020   Procedure: VIDEO BRONCHOSCOPY WITH ENDOBRONCHIAL NAVIGATION;  Surgeon: Garner Nash, DO;  Location: [facility];  Service: Pulmonary;  Laterality: Right;  ION w/ fiducial placement   VIDEO BRONCHOSCOPY WITH RADIAL ENDOBRONCHIAL ULTRASOUND  11/13/2020   Procedure: RADIAL ENDOBRONCHIAL ULTRASOUND;  Surgeon: Garner Nash, DO;  Location: [facility]OSCOPY;  Service: Pulmonary;;    Family History  Problem Relation Age of Onset    Stroke Father      Social History   Tobacco Use  Smoking Status Every Day   Packs/day: 1.00   Types: Cigarettes  Smokeless Tobacco Never  Tobacco Comments   Smokes 1 pack a day [facility] 11/19/20    Social History   Substance and Sexual Activity  Alcohol Use Yes   Alcohol/week: 1.0 standard drink   Types: 1 Cans of beer per week   Comment: beer daily     No Known Allergies  Current Outpatient Medications  Medication Sig Dispense Refill   albuterol (VENTOLIN HFA) 108 (90 Base) MCG/ACT inhaler Inhale 2 puffs into the lungs every 6 (six) hours as needed for wheezing or shortness of breath.     cholecalciferol (VITAMIN D3) 25 MCG (1000 UNIT) tablet Take 1,000 Units by mouth daily.     citalopram (CELEXA) 40 MG tablet Take 40 mg by mouth daily.     clopidogrel (PLAVIX) 75 MG tablet Take 75 mg by mouth daily.  1   Fluticasone-Umeclidin-Vilant (TRELEGY ELLIPTA) 100-62.5-25 MCG/INH AEPB Inhale 1 puff into the lungs daily. 60 each 6   losartan (COZAAR) 100 MG tablet Take 100 mg by mouth every morning.     omeprazole (PRILOSEC) 20 MG capsule Take 20 mg by mouth daily.     PRESCRIPTION MEDICATION Apply 1 mL topically daily. Testosterone Lipoderm 20% compounded at [facility] - apply to stomach or chest     rosuvastatin (CRESTOR) 5 MG tablet Take 5 mg by mouth daily.     tadalafil (CIALIS) 5 MG tablet Take 5 mg by mouth at bedtime.     tamsulosin (FLOMAX) 0.4 MG CAPS capsule Take 0.4 mg by mouth at bedtime.     No current facility-administered medications for this visit.    Review of Systems  Constitutional: Negative.   Respiratory: Negative.    Cardiovascular: Negative.   Neurological: Negative.     PHYSICAL EXAMINATION: BP (!) 168/93 (BP Location: Right Arm, Patient Position: Sitting)   Pulse 79   Resp 20   Ht 5\' 7"  (1.702 m)   Wt 126 lb (57.2 kg)   SpO2 96% Comment: RA  BMI 19.73 kg/m  Physical Exam Constitutional:      General: He is not in acute distress.     Appearance: Normal appearance. He is normal weight. He is not ill-appearing.  HENT:     Head: Normocephalic and atraumatic.  Eyes:     Extraocular Movements: Extraocular movements intact.  Cardiovascular:     Rate and Rhythm: Normal rate.  Pulmonary:     Effort: Pulmonary effort is normal.  Abdominal:     General: Abdomen is flat. There is no distension.  Musculoskeletal:        General: Normal range of motion.     Cervical back: Normal range of motion.  Skin:    General: Skin is warm and dry.  Neurological:     General: No focal deficit present.     Mental Status: He is alert and oriented to person, place, and time.    Diagnostic Studies & Laboratory data:     Recent Radiology Findings:   DG CHEST PORT 1 VIEW  Result Date: 11/13/2020 CLINICAL DATA:  Status post bronch in a 75 year old male. EXAM: PORTABLE CHEST 1 VIEW COMPARISON:  January 12, 2019 FINDINGS: EKG leads project over the chest. Image rotated the LEFT. Accounting for this cardiomediastinal contours and hilar structures are stable. Small nodule in the RIGHT lower lobe is not well visualized. There is a fiducial marker that has been placed at the RIGHT lung base likely due interval bronchoscopy. No lobar consolidative process. No edema or pleural effusion.  No sign of pneumothorax. On limited assessment there is no acute skeletal process. IMPRESSION: Interval bronchoscopy and fiducial marker placement at the RIGHT lung base. No pneumothorax or pleural effusion. Small nodule in the RIGHT lower lobe is not well visualized. Electronically Signed   By: Zetta Bills M.D.   On: 11/13/2020 12:54   CT Super D Chest Wo Contrast  Result Date: 11/12/2020 CLINICAL DATA:  Super D chest for or scheduled bronchoscopy. EXAM: CT CHEST WITHOUT CONTRAST TECHNIQUE: Multidetector CT imaging of the chest was performed using thin slice collimation for electromagnetic bronchoscopy planning purposes, without intravenous contrast. COMPARISON:   PET-CT 09/26/2020 and chest CT 09/04/2020 FINDINGS: Cardiovascular: The heart is normal in size. No pericardial effusion. Stable aortic and coronary artery calcifications. Mediastinum/Nodes: Stable scattered calcified mediastinal hilar lymph nodes. No mass or overt adenopathy. The esophagus is grossly normal. Lungs/Pleura: Stable right apical pleural and parenchymal scarring changes with calcification. Stable advanced emphysematous changes and significant pulmonary scarring. Irregular 16 x 15 mm nodule in the right lower lobe is slightly larger and appears slightly more solid on today's examination when compared to the prior studies. Stable right upper lobe calcified granuloma. Upper Abdomen: Stable appearing 14.5 mm lateral splenic nodule which was hypermetabolic the prior PET-CT scan. Musculoskeletal: No chest wall mass, supraclavicular or axillary adenopathy. The bony thorax is intact. IMPRESSION: 1. 16 x 15 mm irregular right lower lobe pulmonary nodule is slightly larger and appears slightly more solid on today's examination when compared to the prior studies. 2. Stable appearing splenic lesion which was hypermetabolic on prior PET-CT. 3. Stable advanced emphysematous changes and pulmonary scarring. 4. Stable right apical pleural and parenchymal scarring changes with calcification. 5. Stable calcified mediastinal and hilar lymph nodes. 6. Stable advanced atherosclerotic calcifications involving the aorta and branch vessels including the coronary arteries. Aortic Atherosclerosis (ICD10-I70.0) and Emphysema (ICD10-J43.9). Aortic Atherosclerosis (ICD10-I70.0) and Emphysema (ICD10-J43.9). Electronically Signed   By: Marijo Sanes M.D.   On: 11/12/2020 10:52   DG C-ARM BRONCHOSCOPY  Result Date: 11/13/2020 C-ARM BRONCHOSCOPY: Fluoroscopy was utilized by the requesting physician.  No radiographic interpretation.       I have independently reviewed the above radiology studies  and reviewed the findings with  the patient.   Recent Lab Findings: Lab Results  Component Value Date   WBC 7.3 05/29/2014   HGB 17.0 05/29/2014   HCT 47.1  05/29/2014   PLT 152 05/29/2014   GLUCOSE 101 (H) 11/13/2020   NA 136 11/13/2020   K 4.1 11/13/2020   CL 101 11/13/2020   CREATININE 0.91 11/13/2020   BUN 13 11/13/2020   CO2 30 11/13/2020   INR 1.15 05/29/2014     PFTs:  - FVC: 71% - FEV1: 49% -DLCO: 70%  Problem List: 1.6 cm right lower lobe adenocarcinoma. Marginal pulmonary function Ongoing smoking Currently on Plavix for TIA  Assessment / Plan:   This is a 74 year old gentleman who is currently still smoking was a biopsy-proven 1.6 cm adenocarcinoma of the right lower lobe.  We had a long discussion about potential treatment options.  We discussed in detail the pros and cons of surgical resection.  I explained to him that given his ongoing smoking history he will need to be completely off cigarettes for at least 2 weeks prior to being considered for surgery.  Additionally his FEV1 is 49% which could present some pulmonary recovery difficulty.  He did appear more interested to undergo radiation, and would like to meet with the radiation oncologist prior to making a final decision.  I will speak to Dr. Sharma Covert about scheduling this.     I  spent 40 minutes with  the patient face to face in counseling and coordination of care.    Lajuana Matte 11/30/2020 12:23 PM

## 2020-12-03 ENCOUNTER — Telehealth: Payer: Self-pay | Admitting: Pulmonary Disease

## 2020-12-03 DIAGNOSIS — C3491 Malignant neoplasm of unspecified part of right bronchus or lung: Secondary | ICD-10-CM

## 2020-12-03 NOTE — Telephone Encounter (Signed)
Referral placed to rad onc   Garner Nash, DO [facility] 12/03/2020 7:57 AM

## 2020-12-03 NOTE — Telephone Encounter (Signed)
-----  Message from Lajuana Matte, MD sent at 11/30/2020 12:29 PM EST ----- Kevin Brooks, I met with [name] today.  He appears slightly more interested in radiation and wanted to meet with one of the radiation oncologist to discuss treatment options.  He may ultimately consider surgery but wanted to get more information.  Can we get this scheduled  Thanks, H
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
## 2020-12-12 ENCOUNTER — Ambulatory Visit
Admission: RE | Admit: 2020-12-12 | Discharge: 2020-12-12 | Disposition: A | Discharge status: Home / Self Care | Payer: Medicare Other | Source: Ambulatory Visit | Attending: Radiation Oncology | Admitting: Radiation Oncology

## 2020-12-12 ENCOUNTER — Encounter: Payer: Self-pay | Admitting: Radiation Oncology

## 2020-12-12 ENCOUNTER — Other Ambulatory Visit: Payer: Self-pay

## 2020-12-12 VITALS — BP 149/89 | HR 79 | Temp 97.9°F | Resp 20 | Ht 67.5 in | Wt 129.0 lb

## 2020-12-12 DIAGNOSIS — E785 Hyperlipidemia, unspecified: Secondary | ICD-10-CM | POA: Insufficient documentation

## 2020-12-12 DIAGNOSIS — C3431 Malignant neoplasm of lower lobe, right bronchus or lung: Secondary | ICD-10-CM

## 2020-12-12 DIAGNOSIS — N400 Enlarged prostate without lower urinary tract symptoms: Secondary | ICD-10-CM | POA: Insufficient documentation

## 2020-12-12 DIAGNOSIS — I700 Atherosclerosis of aorta: Secondary | ICD-10-CM | POA: Insufficient documentation

## 2020-12-12 DIAGNOSIS — Z8673 Personal history of transient ischemic attack (TIA), and cerebral infarction without residual deficits: Secondary | ICD-10-CM | POA: Diagnosis not present

## 2020-12-12 DIAGNOSIS — E291 Testicular hypofunction: Secondary | ICD-10-CM | POA: Insufficient documentation

## 2020-12-12 DIAGNOSIS — J439 Emphysema, unspecified: Secondary | ICD-10-CM | POA: Diagnosis not present

## 2020-12-12 DIAGNOSIS — M47816 Spondylosis without myelopathy or radiculopathy, lumbar region: Secondary | ICD-10-CM | POA: Diagnosis not present

## 2020-12-12 DIAGNOSIS — K5730 Diverticulosis of large intestine without perforation or abscess without bleeding: Secondary | ICD-10-CM | POA: Insufficient documentation

## 2020-12-12 DIAGNOSIS — F17210 Nicotine dependence, cigarettes, uncomplicated: Secondary | ICD-10-CM | POA: Insufficient documentation

## 2020-12-12 DIAGNOSIS — K219 Gastro-esophageal reflux disease without esophagitis: Secondary | ICD-10-CM | POA: Insufficient documentation

## 2020-12-12 DIAGNOSIS — I10 Essential (primary) hypertension: Secondary | ICD-10-CM | POA: Insufficient documentation
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
## 2020-12-14 ENCOUNTER — Ambulatory Visit
Admission: RE | Admit: 2020-12-14 | Discharge: 2020-12-14 | Disposition: A | Discharge status: Home / Self Care | Payer: Medicare Other | Source: Ambulatory Visit | Attending: Radiation Oncology | Admitting: Radiation Oncology

## 2020-12-14 ENCOUNTER — Other Ambulatory Visit: Payer: Self-pay

## 2020-12-14 DIAGNOSIS — Z510 Encounter for antineoplastic radiation therapy: Secondary | ICD-10-CM | POA: Diagnosis present

## 2020-12-14 DIAGNOSIS — C3411 Malignant neoplasm of upper lobe, right bronchus or lung: Secondary | ICD-10-CM | POA: Insufficient documentation

## 2020-12-26 ENCOUNTER — Other Ambulatory Visit: Payer: Self-pay

## 2020-12-26 ENCOUNTER — Ambulatory Visit
Admission: RE | Admit: 2020-12-26 | Discharge: 2020-12-26 | Disposition: A | Discharge status: Home / Self Care | Payer: Medicare Other | Source: Ambulatory Visit | Attending: Radiation Oncology | Admitting: Radiation Oncology

## 2020-12-26 DIAGNOSIS — Z510 Encounter for antineoplastic radiation therapy: Secondary | ICD-10-CM | POA: Diagnosis not present

## 2020-12-27 ENCOUNTER — Ambulatory Visit: Payer: Medicare Other | Admitting: Radiation Oncology

## 2020-12-28 ENCOUNTER — Ambulatory Visit
Admission: RE | Admit: 2020-12-28 | Discharge: 2020-12-28 | Disposition: A | Discharge status: Home / Self Care | Payer: Medicare Other | Source: Ambulatory Visit | Attending: Radiation Oncology | Admitting: Radiation Oncology

## 2020-12-28 ENCOUNTER — Other Ambulatory Visit: Payer: Self-pay

## 2020-12-28 DIAGNOSIS — Z510 Encounter for antineoplastic radiation therapy: Secondary | ICD-10-CM | POA: Diagnosis not present

## 2021-01-02 ENCOUNTER — Other Ambulatory Visit: Payer: Self-pay

## 2021-01-02 ENCOUNTER — Ambulatory Visit
Admission: RE | Admit: 2021-01-02 | Discharge: 2021-01-02 | Disposition: A | Discharge status: Home / Self Care | Payer: Medicare Other | Source: Ambulatory Visit | Attending: Radiation Oncology | Admitting: Radiation Oncology

## 2021-01-02 ENCOUNTER — Encounter: Payer: Self-pay | Admitting: Radiation Oncology

## 2021-01-02 DIAGNOSIS — Z510 Encounter for antineoplastic radiation therapy: Secondary | ICD-10-CM | POA: Diagnosis not present

## 2021-01-17 ENCOUNTER — Other Ambulatory Visit: Payer: Self-pay | Admitting: Radiation Oncology

## 2021-01-17 ENCOUNTER — Telehealth: Payer: Self-pay | Admitting: *Deleted

## 2021-01-17 DIAGNOSIS — C3431 Malignant neoplasm of lower lobe, right bronchus or lung: Secondary | ICD-10-CM

## 2021-01-17 NOTE — Progress Notes (Signed)
° °                                                                                                                                                          °  Patient Name: Kevin Brooks MRN: [id number] DOB: 10-Oct-1945 Referring Physician: June Leap Date of Service: 01/02/2021 [facility], [place]                                                        End Of Treatment Note  Diagnoses: C34.31-Malignant neoplasm of lower lobe, right bronchus or lung  Cancer Staging: Stage IA2, cT1bN0M0, NSCLC, adenocarcinoma of the RLL  Intent: Curative  Radiation Treatment Dates: 12/26/2020 through 01/02/2021 Site Technique Total Dose (Gy) Dose per Fx (Gy) Completed Fx Beam Energies  Lung, Right: Lung_Rt IMRT 54/54 18 3/3 6XFFF   Narrative: The patient tolerated radiation therapy relatively well.    Plan: The patient will receive a call in about one month from the radiation oncology department. He will be scheduled for a baseline CT scan of the chest in 6-8 weeks.  ________________________________________________    Carola Rhine, PAC

## 2021-01-17 NOTE — Telephone Encounter (Signed)
CALLED PATIENT TO INFORM OF CT FOR 01-31-21- ARRIVAL TIME- 12 PM @ [facility], PATIENT TO HAVE WATER ONLY- 4 HRS. PRIOR TO TEST, ALISON WILL CALL PATIENT WITH RESULTS, LVM FOR A RETURN CALL

## 2021-01-31 ENCOUNTER — Ambulatory Visit (HOSPITAL_COMMUNITY)
Admission: RE | Admit: 2021-01-31 | Discharge: 2021-01-31 | Disposition: A | Discharge status: Home / Self Care | Payer: Medicare Other | Source: Ambulatory Visit | Attending: Radiation Oncology | Admitting: Radiation Oncology

## 2021-01-31 ENCOUNTER — Other Ambulatory Visit: Payer: Self-pay

## 2021-01-31 DIAGNOSIS — C3431 Malignant neoplasm of lower lobe, right bronchus or lung: Secondary | ICD-10-CM | POA: Diagnosis not present

## 2021-01-31 LAB — POCT I-STAT CREATININE: Creatinine, Ser: 0.9 mg/dL (ref 0.61–1.24)

## 2021-01-31 MED ORDER — IOHEXOL 350 MG/ML SOLN
80.0000 mL | Freq: Once | INTRAVENOUS | Status: AC | PRN
  Administered 2021-01-31: 75 mL via INTRAVENOUS

## 2021-01-31 MED ORDER — SODIUM CHLORIDE (PF) 0.9 % IJ SOLN
INTRAMUSCULAR | Status: AC
  Filled 2021-01-31: qty 50

## 2021-02-04 ENCOUNTER — Ambulatory Visit
Admission: RE | Admit: 2021-02-04 | Discharge: 2021-02-04 | Disposition: A | Discharge status: Home / Self Care | Payer: Medicare Other | Source: Ambulatory Visit | Attending: Radiation Oncology | Admitting: Radiation Oncology

## 2021-02-04 DIAGNOSIS — C3431 Malignant neoplasm of lower lobe, right bronchus or lung: Secondary | ICD-10-CM

## 2021-02-04 MED ORDER — DOXYCYCLINE HYCLATE 100 MG PO TABS: 100.0000 mg | ORAL_TABLET | Freq: Two times a day (BID) | ORAL | 0 refills | Status: DC

## 2021-02-04 NOTE — Progress Notes (Signed)
°  Radiation Oncology         [phone] ________________________________  Name: Kevin Brooks MRN: [id number]  Date of Service: 02/04/2021  DOB: September 07, 1945  Post Treatment Telephone Note  Diagnosis:   Stage IA2, cT1bN0M0, NSCLC, adenocarcinoma of the RLL  Intent: Curative  Radiation Treatment Dates: 12/26/2020 through 01/02/2021 Site Technique Total Dose (Gy) Dose per Fx (Gy) Completed Fx Beam Energies  Lung, Right: Lung_Rt IMRT 54/54 18 3/3 6XFFF   Narrative: The patient tolerated radiation therapy relatively well.  He reports that since his radiation, he has been feeling okay.  He did undergo repeat CT chest on 01/31/2021 which showed bilateral nodules throughout the lungs likely consistent with inflammatory or infectious changes.  The area in the right lower lobe that was treated is several millimeters smaller in dimension when I measured this on his imaging.  No lymphadenopathy was noted.  Stigmata of atherosclerosis and emphysematous changes remain.  He reports that he did have fatigue which has since resolved since completing treatment.  He has however developed a hacking cough and productive sputum which is more regular.  He feels as though he has had a cold for quite some time.  He has tested himself on several occasions and has been negative for COVID-19.  He has not had any fevers or chills.  The sputum has ranged in color from clear to yellow.  He denies any hemoptysis or frothy sputum.  He also denies a history of known tuberculosis exposures, or other atypical fungal, bacterial, or viral, or immune linked infections.  While he resides at [facility] and recognizes that there are some illnesses going around in that community, he does not have any direct known sick contacts.  Impression/Plan: 1. Stage IA2, cT1bN0M0, NSCLC, adenocarcinoma of the RLL. The patient has been doing well since completion of radiotherapy.  We discussed the findings from his recent imaging, this appears to be  consistent with inflammatory or infectious type changes.  While he does not have symptoms or radiographic findings to classify this as pneumonitis, or pneumonia, I think it is worthwhile treating him with doxycycline.  If his symptoms do not improve within the next week, I recommended that he follow-up with pulmonary medicine and conveniently he has a follow-up scheduled already with Dr. Valeta Harms on 02/20/2021.  We appreciate his input if the patient is still having symptoms at that time.  Otherwise we will plan to repeat a CT of the chest 2 to 3 months from now to ensure that these areas have resolved.    Carola Rhine, PAC

## 2021-02-12 ENCOUNTER — Telehealth: Payer: Self-pay | Admitting: Radiation Oncology

## 2021-02-12 NOTE — Telephone Encounter (Signed)
The patient called to let me know he is still symptomatic despite doxycycline. No fevers or chills are noted, no new symptoms of hemoptysis or shortness of breath. He is curious about antiviral therapy. He continues to have productive cough that is clear to very light white in color. He denies fevers, chills, shortness of breath, or new sick contacts. He continues Doxycycline despite lack of improvement in symptoms. I let him know I was going to reach out to Dr. Valeta Harms to see if he recommends coverage for atypical pneumonia prior to his visit with the patient next week. I will follow up with the patient regarding recommendations.

## 2021-02-13 ENCOUNTER — Telehealth: Payer: Self-pay

## 2021-02-13 NOTE — Telephone Encounter (Signed)
Patient notified to finish the doxycyline and not start anything new until he sees Dr. Valeta Harms next week. Information understanding verbalized.

## 2021-02-20 ENCOUNTER — Encounter: Payer: Self-pay | Admitting: Pulmonary Disease

## 2021-02-20 ENCOUNTER — Other Ambulatory Visit: Payer: Self-pay

## 2021-02-20 ENCOUNTER — Ambulatory Visit: Payer: Medicare Other | Admitting: Pulmonary Disease

## 2021-02-20 VITALS — BP 130/68 | HR 90 | Temp 97.6°F | Ht 67.0 in | Wt 135.2 lb

## 2021-02-20 DIAGNOSIS — R9389 Abnormal findings on diagnostic imaging of other specified body structures: Secondary | ICD-10-CM | POA: Diagnosis not present

## 2021-02-20 DIAGNOSIS — R911 Solitary pulmonary nodule: Secondary | ICD-10-CM

## 2021-02-20 DIAGNOSIS — D7389 Other diseases of spleen: Secondary | ICD-10-CM | POA: Diagnosis not present

## 2021-02-20 DIAGNOSIS — R942 Abnormal results of pulmonary function studies: Secondary | ICD-10-CM | POA: Diagnosis not present

## 2021-02-20 NOTE — Patient Instructions (Addendum)
Thank you for visiting Dr. Valeta Harms at [facility]. Today we recommend the following:  Orders Placed This Encounter  Procedures   CT Super D Chest Wo Contrast   Stay on trelegy   Follow up with [place] to make sure the new LUL nodule is stable.   Return in about 10 weeks (around 05/01/2021) for with Eric Form, NP, or Dr. Valeta Harms.    Please do your part to reduce the spread of COVID-19.

## 2021-02-20 NOTE — Progress Notes (Signed)
Synopsis: Referred in October 2022 for abnormal ct by Derinda Late, MD  Subjective:   PATIENT ID: Kevin Brooks DOB: 03/29/1945, MRN: [id number]  Chief Complaint  Patient presents with   Follow-up    Follow up.     This is 76 yo M, medical history of COPD, emphysema, gastroesophageal reflux, longstanding history of tobacco abuse.  He has smoked since he was 76 years old.  He has been enrolled in lung cancer screening program.  He had a repeat lung cancer screening CT that was completed last month with revealed a right lower lobe subsolid lesion.  Patient underwent PET scan for this which revealed low-level metabolic uptake concerning for indolent neoplasm.  Additionally has a lesion found within his spleen that was hypermetabolic with SUV of 7.  Patient was referred today to discuss neck steps regarding lung nodule and consideration for biopsy.  He does have COPD currently managed with Spiriva and Serevent.  OV 02/20/2021: Patient last seen in the office for evaluation of a lung nodule.  Was taken for bronchoscopy with tissue sampling and biopsy confirming non-small cell lung cancer.  Patient has established care with radiation oncology and completing treatments with SBRT.Consult from Dr. Lisbeth Renshaw on 12/12/2020 reviewed.  Patient does however have a new nodule in the left upper lobe in comparison to the previous CTs.  He had ongoing cough shortness of breath sputum production.  Recent CT scan in January showed a posterior infiltrate he was treated with 14 days of doxycycline.  He feels much better since then.   Past Medical History:  Diagnosis Date   BPH (benign prostatic hyperplasia)    Cataracta    COPD (chronic obstructive pulmonary disease) ([place])    Depression    ED (erectile dysfunction)    GERD (gastroesophageal reflux disease)    Hypertension    Hypogonadism Brooks    Lung cancer ([place]) 11/13/2020   Mixed hyperlipidemia    Palpitations    TIA (transient ischemic  attack)    Vision abnormalities      Family History  Problem Relation Age of Onset   Stroke Father      Past Surgical History:  Procedure Laterality Date    catarcts surgery     BRONCHIAL BIOPSY  11/13/2020   Procedure: BRONCHIAL BIOPSIES;  Surgeon: Garner Nash, DO;  Location: [facility];  Service: Pulmonary;;   BRONCHIAL BRUSHINGS  11/13/2020   Procedure: BRONCHIAL BRUSHINGS;  Surgeon: Garner Nash, DO;  Location: [facility];  Service: Pulmonary;;   BRONCHIAL NEEDLE ASPIRATION BIOPSY  11/13/2020   Procedure: BRONCHIAL NEEDLE ASPIRATION BIOPSIES;  Surgeon: Garner Nash, DO;  Location: [facility];  Service: Pulmonary;;   COLONOSCOPY     COLONOSCOPY WITH PROPOFOL N/A 03/16/2020   Procedure: COLONOSCOPY WITH PROPOFOL;  Surgeon: Carol Ada, MD;  Location: [facility];  Service: Endoscopy;  Laterality: N/A;   ENTEROSCOPY N/A 03/16/2020   Procedure: ENTEROSCOPY;  Surgeon: Carol Ada, MD;  Location: [facility];  Service: Endoscopy;  Laterality: N/A;   HEMOSTASIS CLIP PLACEMENT  03/16/2020   Procedure: HEMOSTASIS CLIP PLACEMENT;  Surgeon: Carol Ada, MD;  Location: [facility];  Service: Endoscopy;;   HOT HEMOSTASIS N/A 03/16/2020   Procedure: HOT HEMOSTASIS (ARGON PLASMA COAGULATION/BICAP);  Surgeon: Carol Ada, MD;  Location: [facility];  Service: Endoscopy;  Laterality: N/A;   POLYPECTOMY  03/16/2020   Procedure: POLYPECTOMY;  Surgeon: Carol Ada, MD;  [facility]: [facility];  Service: Endoscopy;;   VIDEO BRONCHOSCOPY WITH ENDOBRONCHIAL NAVIGATION  Right 11/13/2020   Procedure: VIDEO BRONCHOSCOPY WITH ENDOBRONCHIAL NAVIGATION;  Surgeon: Garner Nash, DO;  Location: [facility];  Service: Pulmonary;  Laterality: Right;  ION w/ fiducial placement   VIDEO BRONCHOSCOPY WITH RADIAL ENDOBRONCHIAL ULTRASOUND  11/13/2020   Procedure: RADIAL ENDOBRONCHIAL ULTRASOUND;  Surgeon: Garner Nash, DO;  Location: [facility];  Service: Pulmonary;;    Social History    Socioeconomic History   Marital status: Single    Spouse name: Not on file   Number of children: Not on file   Years of education: Not on file   Highest education level: Not on file  Occupational History   Not on file  Tobacco Use   Smoking status: Every Day    Packs/day: 1.00    Types: Cigarettes   Smokeless tobacco: Never   Tobacco comments:    Smokes 1 pack a day ARJ 11/19/20  Vaping Use   Vaping Use: Every day  Substance and Sexual Activity   Alcohol use: Yes    Alcohol/week: 1.0 standard drink    Types: 1 Cans of beer per week    Comment: beer daily   Drug use: No   Sexual activity: Not on file  Other Topics Concern   Not on file  Social History Narrative   Not on file   Social Determinants of Health   Financial Resource Strain: Not on file  Food Insecurity: Not on file  Transportation Needs: Not on file  Physical Activity: Not on file  Stress: Not on file  Social Connections: Not on file  Intimate Partner Violence: Not At Risk   Fear of Current or Ex-Partner: No   Emotionally Abused: No   Physically Abused: No   Sexually Abused: No     No Known Allergies   Outpatient Medications Prior to Visit  Medication Sig Dispense Refill   albuterol (VENTOLIN HFA) 108 (90 Base) MCG/ACT inhaler Inhale 2 puffs into the lungs every 6 (six) hours as needed for wheezing or shortness of breath.     cholecalciferol (VITAMIN D3) 25 MCG (1000 UNIT) tablet Take 1,000 Units by mouth daily.     citalopram (CELEXA) 40 MG tablet Take 40 mg by mouth daily.     clopidogrel (PLAVIX) 75 MG tablet Take 75 mg by mouth daily.  1   Fluticasone-Umeclidin-Vilant (TRELEGY ELLIPTA) 100-62.5-25 MCG/INH AEPB Inhale 1 puff into the lungs daily. 60 each 6   losartan (COZAAR) 100 MG tablet Take 100 mg by mouth every morning.     omeprazole (PRILOSEC) 20 MG capsule Take 20 mg by mouth daily.     PRESCRIPTION MEDICATION Apply 1 mL topically daily. Testosterone Lipoderm 20% compounded at [facility] - apply to stomach or chest     rosuvastatin (CRESTOR) 5 MG tablet Take 5 mg by mouth daily.     tadalafil (CIALIS) 5 MG tablet Take 5 mg by mouth at bedtime.     tamsulosin (FLOMAX) 0.4 MG CAPS capsule Take 0.4 mg by mouth at bedtime.     doxycycline (VIBRA-TABS) 100 MG tablet Take 1 tablet (100 mg total) by mouth 2 (two) times daily. (Patient not taking: Reported on 02/20/2021) 28 tablet 0   No facility-administered medications prior to visit.    Review of Systems  Constitutional:  Negative for chills, fever, malaise/fatigue and weight loss.  HENT:  Negative for hearing loss, sore throat and tinnitus.   Eyes:  Negative for blurred vision and double vision.  Respiratory:  Positive for cough and  shortness of breath. Negative for hemoptysis, sputum production, wheezing and stridor.   Cardiovascular:  Negative for chest pain, palpitations, orthopnea, leg swelling and PND.  Gastrointestinal:  Negative for abdominal pain, constipation, diarrhea, heartburn, nausea and vomiting.  Genitourinary:  Negative for dysuria, hematuria and urgency.  Musculoskeletal:  Negative for joint pain and myalgias.  Skin:  Negative for itching and rash.  Neurological:  Negative for dizziness, tingling, weakness and headaches.  Endo/Heme/Allergies:  Negative for environmental allergies. Does not bruise/bleed easily.  Psychiatric/Behavioral:  Negative for depression. The patient is not nervous/anxious and does not have insomnia.   All other systems reviewed and are negative.   Objective:  Physical Exam Vitals reviewed.  Constitutional:      General: He is not in acute distress.    Appearance: He is well-developed.  HENT:     Head: Normocephalic and atraumatic.     Mouth/Throat:     Pharynx: No oropharyngeal exudate.  Eyes:     Conjunctiva/sclera: Conjunctivae normal.     Pupils: Pupils are equal, round, and reactive to light.  Neck:     Vascular: No JVD.     Trachea: No tracheal deviation.      Comments: Loss of supraclavicular fat Cardiovascular:     Rate and Rhythm: Normal rate and regular rhythm.     Heart sounds: S1 normal and S2 normal.     Comments: Distant heart tones Pulmonary:     Effort: No tachypnea or accessory muscle usage.     Breath sounds: No stridor. Decreased breath sounds (throughout all lung fields) present. No wheezing, rhonchi or rales.  Abdominal:     General: Bowel sounds are normal. There is no distension.     Palpations: Abdomen is soft.     Tenderness: There is no abdominal tenderness.  Musculoskeletal:        General: No deformity (muscle wasting ).  Skin:    General: Skin is warm and dry.     Capillary Refill: Capillary refill takes less than 2 seconds.     Findings: No rash.  Neurological:     Mental Status: He is alert and oriented to person, place, and time.  Psychiatric:        Behavior: Behavior normal.     Vitals:   02/20/21 1117  BP: 130/68  Pulse: 90  Temp: 97.6 F (36.4 C)  TempSrc: Oral  SpO2: 99%  Weight: 135 lb 3.2 oz (61.3 kg)  Height: 5\' 7"  (1.702 m)   99% on RA BMI Readings from Last 3 Encounters:  02/20/21 21.18 kg/m  12/12/20 19.91 kg/m  11/30/20 19.73 kg/m   Wt Readings from Last 3 Encounters:  02/20/21 135 lb 3.2 oz (61.3 kg)  12/12/20 129 lb (58.5 kg)  11/30/20 126 lb (57.2 kg)     CBC    Component Value Date/Time   WBC 7.3 05/29/2014 1325   RBC 5.08 05/29/2014 1325   HGB 17.0 05/29/2014 1325   HCT 47.1 05/29/2014 1325   PLT 152 05/29/2014 1325   MCV 92.7 05/29/2014 1325   MCH 33.5 05/29/2014 1325   MCHC 36.1 (H) 05/29/2014 1325   RDW 12.5 05/29/2014 1325    Chest Imaging: 09/26/2020: Nuclear medicine pet imaging reveals a subsolid pulmonary nodule within the right right lower lobe at 1.4 cm low-level SUV concerning for malignancy. Also splenic lesion which is hypermetabolic concerning for lymphoma versus metastasis. The patient's images have been independently reviewed by me.    January  2023 CT chest:  Multiple pulmonary nodules, new in the left upper lobe.  Evidence of severe centrilobular emphysema. The patient's images have been independently reviewed by me.      Pulmonary Functions Testing Results: PFT Results Latest Ref Rng & Units 09/13/2020  FVC-Pre L 2.85  FVC-Predicted Pre % 71  FVC-Post L 3.35  FVC-Predicted Post % 84  Pre FEV1/FVC % % 50  Post FEV1/FCV % % 48  FEV1-Pre L 1.42  FEV1-Predicted Pre % 49  FEV1-Post L 1.60  DLCO uncorrected ml/min/mmHg 14.46  DLCO UNC% % 60  DLCO corrected ml/min/mmHg 14.46  DLCO COR %Predicted % 60  DLVA Predicted % 57  TLC L 8.20  TLC % Predicted % 123  RV % Predicted % 191    FeNO:   Pathology:   Echocardiogram:   Heart Catheterization:     Assessment & Plan:     ICD-10-CM   1. Nodule of upper lobe of left lung  R91.1 CT Super D Chest Wo Contrast    2. Abnormal PET of right lung  R94.2     3. Abnormal CT of the chest  R93.89     4. Lesion of spleen  D73.89       Discussion:  76 year old gentleman, lung nodule in the right lower lobe diagnosed with adenocarcinoma, taken for SBRT treatments also found to have a hypermetabolic lesion in the spleen which was felt to be related to previous trauma also currently following with general surgery.  Recent CT scan for follow-up shows a new left upper lobe nodule and an infiltrate in the left upper lobe concerning for early pneumonia.  Patient was treated with Doxy and improving.  Plan: Repeat noncontrasted CT chest and 3 months which will be completed in April 2023. Follow-up with [place] after this to make sure the left upper lobe nodule dissipates. Continue Trelegy inhaler. Continue albuterol as needed. Follow-up with [place] in April after CT.    Current Outpatient Medications:    albuterol (VENTOLIN HFA) 108 (90 Base) MCG/ACT inhaler, Inhale 2 puffs into the lungs every 6 (six) hours as needed for wheezing or shortness of breath., Disp: , Rfl:    cholecalciferol  (VITAMIN D3) 25 MCG (1000 UNIT) tablet, Take 1,000 Units by mouth daily., Disp: , Rfl:    citalopram (CELEXA) 40 MG tablet, Take 40 mg by mouth daily., Disp: , Rfl:    clopidogrel (PLAVIX) 75 MG tablet, Take 75 mg by mouth daily., Disp: , Rfl: 1   Fluticasone-Umeclidin-Vilant (TRELEGY ELLIPTA) 100-62.5-25 MCG/INH AEPB, Inhale 1 puff into the lungs daily., Disp: 60 each, Rfl: 6   losartan (COZAAR) 100 MG tablet, Take 100 mg by mouth every morning., Disp: , Rfl:    omeprazole (PRILOSEC) 20 MG capsule, Take 20 mg by mouth daily., Disp: , Rfl:    PRESCRIPTION MEDICATION, Apply 1 mL topically daily. Testosterone Lipoderm 20% compounded at [facility] - apply to stomach or chest, Disp: , Rfl:    rosuvastatin (CRESTOR) 5 MG tablet, Take 5 mg by mouth daily., Disp: , Rfl:    tadalafil (CIALIS) 5 MG tablet, Take 5 mg by mouth at bedtime., Disp: , Rfl:    tamsulosin (FLOMAX) 0.4 MG CAPS capsule, Take 0.4 mg by mouth at bedtime., Disp: , Rfl:    doxycycline (VIBRA-TABS) 100 MG tablet, Take 1 tablet (100 mg total) by mouth 2 (two) times daily. (Patient not taking: Reported on 02/20/2021), Disp: 28 tablet, Rfl: 0  Garner Nash, DO [facility] 02/20/2021 11:47  AM

## 2021-03-26 ENCOUNTER — Telehealth: Payer: Self-pay | Admitting: *Deleted

## 2021-03-26 NOTE — Telephone Encounter (Signed)
CALLED PATIENT TO GIVE INFO, LVM FOR A RETURN CALL ?

## 2021-04-30 ENCOUNTER — Ambulatory Visit (HOSPITAL_COMMUNITY)
Admission: RE | Admit: 2021-04-30 | Discharge: 2021-04-30 | Disposition: A | Discharge status: Home / Self Care | Payer: Medicare Other | Source: Ambulatory Visit | Attending: Pulmonary Disease | Admitting: Pulmonary Disease

## 2021-04-30 DIAGNOSIS — R911 Solitary pulmonary nodule: Secondary | ICD-10-CM | POA: Insufficient documentation

## 2021-05-06 ENCOUNTER — Encounter: Payer: Self-pay | Admitting: Pulmonary Disease

## 2021-05-06 ENCOUNTER — Ambulatory Visit: Payer: Medicare Other | Admitting: Pulmonary Disease

## 2021-05-06 VITALS — BP 130/72 | HR 75 | Temp 98.2°F | Ht 67.0 in | Wt 139.4 lb

## 2021-05-06 DIAGNOSIS — R911 Solitary pulmonary nodule: Secondary | ICD-10-CM

## 2021-05-06 DIAGNOSIS — C3491 Malignant neoplasm of unspecified part of right bronchus or lung: Secondary | ICD-10-CM | POA: Diagnosis not present

## 2021-05-06 DIAGNOSIS — R942 Abnormal results of pulmonary function studies: Secondary | ICD-10-CM | POA: Diagnosis not present

## 2021-05-06 NOTE — Patient Instructions (Addendum)
Thank you for visiting Dr. Valeta Harms at [facility]. ?Today we recommend the following: ? ?Orders Placed This Encounter  ?Procedures  ? CT CHEST WO CONTRAST  ? ?Repeat CT Chest in 3 months, follow up with [place] after wards  ? ?Return in about 3 months (around 08/05/2021) for with Eric Form, NP, or Dr. Valeta Harms. ? ? ? ?Please do your part to reduce the spread of COVID-19.  ? ?

## 2021-05-06 NOTE — Progress Notes (Signed)
? ?Synopsis: Referred in October 2022 for abnormal ct by Derinda Late, MD ? ?Subjective:  ? ?PATIENT ID: Kevin Brooks GENDER: male DOB: October 22, 1945, MRN: [id number] ? ?Chief Complaint  ?Patient presents with  ? Follow-up  ?  10 week follow up. Pt states he is doing a little worse this time due to the emphysema.   ? ? ?This is 77 yo M, medical history of COPD, emphysema, gastroesophageal reflux, longstanding history of tobacco abuse.  He has smoked since he was 76 years old.  He has been enrolled in lung cancer screening program.  He had a repeat lung cancer screening CT that was completed last month with revealed a right lower lobe subsolid lesion.  Patient underwent PET scan for this which revealed low-level metabolic uptake concerning for indolent neoplasm.  Additionally has a lesion found within his spleen that was hypermetabolic with SUV of 7.  Patient was referred today to discuss neck steps regarding lung nodule and consideration for biopsy.  He does have COPD currently managed with Spiriva and Serevent. ? ?OV 02/20/2021: Patient last seen in the office for evaluation of a lung nodule.  Was taken for bronchoscopy with tissue sampling and biopsy confirming non-small cell lung cancer.  Patient has established care with radiation oncology and completing treatments with SBRT.Consult from Dr. Lisbeth Renshaw on 12/12/2020 reviewed.  Patient does however have a new nodule in the left upper lobe in comparison to the previous CTs.  He had ongoing cough shortness of breath sputum production.  Recent CT scan in January showed a posterior infiltrate he was treated with 14 days of doxycycline.  He feels much better since then. ? ?OV 05/06/2021: Here today for follow-up after recent CT scan of the chest.  Was also started on Trelegy.  Still using Trelegy.CT was completed on 05/01/2021 which revealed some new patchy airspace opacity and interstitial thickening in the right lung base.  There was interval decrease in the size of the  pulmonary lesions and other scattered nodules.  He has some stable coronary calcifications.  Discussed this with the patient today reviewed his imaging.  We reviewed his CT scan results.  From a respiratory standpoint he is breathing okay.  He is completing his aerobic exercise class on Monday and Friday. ? ? ?Past Medical History:  ?Diagnosis Date  ? BPH (benign prostatic hyperplasia)   ? Cataracta   ? COPD (chronic obstructive pulmonary disease) ([place])   ? Depression   ? ED (erectile dysfunction)   ? GERD (gastroesophageal reflux disease)   ? Hypertension   ? Hypogonadism male   ? Lung cancer ([place]) 11/13/2020  ? Mixed hyperlipidemia   ? Palpitations   ? TIA (transient ischemic attack)   ? Vision abnormalities   ?  ? ?Family History  ?Problem Relation Age of Onset  ? Stroke Father   ?  ? ?Past Surgical History:  ?Procedure Laterality Date  ?  catarcts surgery    ? BRONCHIAL BIOPSY  11/13/2020  ? Procedure: BRONCHIAL BIOPSIES;  Surgeon: Garner Nash, DO;  Location: [facility];  Service: Pulmonary;;  ? BRONCHIAL BRUSHINGS  11/13/2020  ? Procedure: BRONCHIAL BRUSHINGS;  Surgeon: Garner Nash, DO;  Location: [facility];  Service: Pulmonary;;  ? BRONCHIAL NEEDLE ASPIRATION BIOPSY  11/13/2020  ? Procedure: BRONCHIAL NEEDLE ASPIRATION BIOPSIES;  Surgeon: Garner Nash, DO;  Location: [facility];  Service: Pulmonary;;  ? COLONOSCOPY    ? COLONOSCOPY WITH PROPOFOL N/A 03/16/2020  ? Procedure: COLONOSCOPY WITH PROPOFOL;  Surgeon: Carol Ada, MD;  Location: [facility];  Service: Endoscopy;  Laterality: N/A;  ? ENTEROSCOPY N/A 03/16/2020  ? Procedure: ENTEROSCOPY;  Surgeon: Carol Ada, MD;  Location: [facility];  Service: Endoscopy;  Laterality: N/A;  ? HEMOSTASIS CLIP PLACEMENT  03/16/2020  ? Procedure: HEMOSTASIS CLIP PLACEMENT;  Surgeon: Carol Ada, MD;  Location: [facility];  Service: Endoscopy;;  ? HOT HEMOSTASIS N/A 03/16/2020  ? Procedure: HOT HEMOSTASIS (ARGON PLASMA COAGULATION/BICAP);  Surgeon:  Carol Ada, MD;  Location: [facility];  Service: Endoscopy;  Laterality: N/A;  ? POLYPECTOMY  03/16/2020  ? Procedure: POLYPECTOMY;  Surgeon: Carol Ada, MD;  Location: [facility];  Service: Endoscopy;;  ? VIDEO BRONCHOSCOPY WITH ENDOBRONCHIAL NAVIGATION Right 11/13/2020  ? Procedure: VIDEO BRONCHOSCOPY WITH ENDOBRONCHIAL NAVIGATION;  Surgeon: Garner Nash, DO;  Location: [facility];  Service: Pulmonary;  Laterality: Right;  ION w/ fiducial placement  ? VIDEO BRONCHOSCOPY WITH RADIAL ENDOBRONCHIAL ULTRASOUND  11/13/2020  ? Procedure: RADIAL ENDOBRONCHIAL ULTRASOUND;  Surgeon: Garner Nash, DO;  Location: [facility];  Service: Pulmonary;;  ? ? ?Social History  ? ?Socioeconomic History  ? Marital status: Single  ?  Spouse name: Not on file  ? Number of children: Not on file  ? Years of education: Not on file  ? Highest education level: Not on file  ?Occupational History  ? Not on file  ?Tobacco Use  ? Smoking status: Every Day  ?  Packs/day: 1.00  ?  Types: Cigarettes  ? Smokeless tobacco: Never  ? Tobacco comments:  ?  Smokes 1 pack a day ARJ 11/19/20  ?Vaping Use  ? Vaping Use: Every day  ?Substance and Sexual Activity  ? Alcohol use: Yes  ?  Alcohol/week: 1.0 standard drink  ?  Types: 1 Cans of beer per week  ?  Comment: beer daily  ? Drug use: No  ? Sexual activity: Not on file  ?Other Topics Concern  ? Not on file  ?Social History Narrative  ? Not on file  ? ?Social Determinants of Health  ? ?Financial Resource Strain: Not on file  ?Food Insecurity: Not on file  ?Transportation Needs: Not on file  ?Physical Activity: Not on file  ?Stress: Not on file  ?Social Connections: Not on file  ?Intimate Partner Violence: Not At Risk  ? Fear of Current or Ex-Partner: No  ? Emotionally Abused: No  ? Physically Abused: No  ? Sexually Abused: No  ?  ? ?No Known Allergies  ? ?Outpatient Medications Prior to Visit  ?Medication Sig Dispense Refill  ? albuterol (VENTOLIN HFA) 108 (90 Base) MCG/ACT inhaler Inhale  2 puffs into the lungs every 6 (six) hours as needed for wheezing or shortness of breath.    ? cholecalciferol (VITAMIN D3) 25 MCG (1000 UNIT) tablet Take 1,000 Units by mouth daily.    ? citalopram (CELEXA) 40 MG tablet Take 40 mg by mouth daily.    ? clopidogrel (PLAVIX) 75 MG tablet Take 75 mg by mouth daily.  1  ? Fluticasone-Umeclidin-Vilant (TRELEGY ELLIPTA) 100-62.5-25 MCG/INH AEPB Inhale 1 puff into the lungs daily. 60 each 6  ? losartan (COZAAR) 100 MG tablet Take 100 mg by mouth every morning.    ? omeprazole (PRILOSEC) 20 MG capsule Take 20 mg by mouth daily.    ? PRESCRIPTION MEDICATION Apply 1 mL topically daily. Testosterone Lipoderm 20% compounded at [facility] - apply to stomach or chest    ? rosuvastatin (CRESTOR) 5 MG tablet Take 5 mg  by mouth daily.    ? tadalafil (CIALIS) 5 MG tablet Take 5 mg by mouth at bedtime.    ? tamsulosin (FLOMAX) 0.4 MG CAPS capsule Take 0.4 mg by mouth at bedtime.    ? doxycycline (VIBRA-TABS) 100 MG tablet Take 1 tablet (100 mg total) by mouth 2 (two) times daily. (Patient not taking: Reported on 02/20/2021) 28 tablet 0  ? ?No facility-administered medications prior to visit.  ? ? ?Review of Systems  ?Constitutional:  Negative for chills, fever, malaise/fatigue and weight loss.  ?HENT:  Negative for hearing loss, sore throat and tinnitus.   ?Eyes:  Negative for blurred vision and double vision.  ?Respiratory:  Positive for shortness of breath. Negative for cough, hemoptysis, sputum production, wheezing and stridor.   ?Cardiovascular:  Negative for chest pain, palpitations, orthopnea, leg swelling and PND.  ?Gastrointestinal:  Negative for abdominal pain, constipation, diarrhea, heartburn, nausea and vomiting.  ?Genitourinary:  Negative for dysuria, hematuria and urgency.  ?Musculoskeletal:  Negative for joint pain and myalgias.  ?Skin:  Negative for itching and rash.  ?Neurological:  Negative for dizziness, tingling, weakness and headaches.   ?Endo/Heme/Allergies:  Negative for environmental allergies. Does not bruise/bleed easily.  ?Psychiatric/Behavioral:  Negative for depression. The patient is not nervous/anxious and does not have insomnia.   ?All other syst

## 2021-05-23 ENCOUNTER — Other Ambulatory Visit: Payer: Self-pay | Admitting: Surgery

## 2021-05-23 DIAGNOSIS — R161 Splenomegaly, not elsewhere classified: Secondary | ICD-10-CM

## 2021-07-23 ENCOUNTER — Ambulatory Visit (HOSPITAL_COMMUNITY)
Admission: RE | Admit: 2021-07-23 | Discharge: 2021-07-23 | Disposition: A | Discharge status: Home / Self Care | Payer: Medicare Other | Source: Ambulatory Visit | Attending: Pulmonary Disease | Admitting: Pulmonary Disease

## 2021-07-23 DIAGNOSIS — R911 Solitary pulmonary nodule: Secondary | ICD-10-CM

## 2021-07-23 DIAGNOSIS — I700 Atherosclerosis of aorta: Secondary | ICD-10-CM | POA: Diagnosis not present

## 2021-07-23 DIAGNOSIS — R918 Other nonspecific abnormal finding of lung field: Secondary | ICD-10-CM | POA: Diagnosis not present

## 2021-07-23 DIAGNOSIS — J439 Emphysema, unspecified: Secondary | ICD-10-CM | POA: Diagnosis not present

## 2021-07-23 DIAGNOSIS — C3491 Malignant neoplasm of unspecified part of right bronchus or lung: Secondary | ICD-10-CM | POA: Diagnosis present

## 2021-08-06 ENCOUNTER — Ambulatory Visit: Payer: Medicare Other | Admitting: Pulmonary Disease

## 2021-08-06 ENCOUNTER — Encounter: Payer: Self-pay | Admitting: Pulmonary Disease

## 2021-08-06 VITALS — BP 138/76 | HR 83 | Temp 98.2°F | Ht 67.0 in | Wt 140.0 lb

## 2021-08-06 DIAGNOSIS — J432 Centrilobular emphysema: Secondary | ICD-10-CM | POA: Diagnosis not present

## 2021-08-06 DIAGNOSIS — J449 Chronic obstructive pulmonary disease, unspecified: Secondary | ICD-10-CM | POA: Diagnosis not present

## 2021-08-06 DIAGNOSIS — R911 Solitary pulmonary nodule: Secondary | ICD-10-CM

## 2021-08-06 DIAGNOSIS — C3491 Malignant neoplasm of unspecified part of right bronchus or lung: Secondary | ICD-10-CM

## 2021-08-06 NOTE — Patient Instructions (Signed)
Thank you for visiting Dr. Valeta Harms at [facility]. Today we recommend the following:  Orders Placed This Encounter  Procedures   CT Chest Wo Contrast   Please see [place] after CT chest in October.  Continue trelegy  Continue exercise regimen.   Return in about 3 months (around 11/06/2021).    Please do your part to reduce the spread of COVID-19.

## 2021-08-06 NOTE — Progress Notes (Signed)
Synopsis: Referred in October 2022 for abnormal ct by Derinda Late, MD  Subjective:   PATIENT ID: Kevin Brooks GENDER: male DOB: July 09, 1945, MRN: [id number]  Chief Complaint  Patient presents with   Follow-up    Pt is here today to go over CT scan results. CT scan was done 2 weeks ago     This is 76 yo M, medical history of COPD, emphysema, gastroesophageal reflux, longstanding history of tobacco abuse.  He has smoked since he was 76 years old.  He has been enrolled in lung cancer screening program.  He had a repeat lung cancer screening CT that was completed last month with revealed a right lower lobe subsolid lesion.  Patient underwent PET scan for this which revealed low-level metabolic uptake concerning for indolent neoplasm.  Additionally has a lesion found within his spleen that was hypermetabolic with SUV of 7.  Patient was referred today to discuss neck steps regarding lung nodule and consideration for biopsy.  He does have COPD currently managed with Spiriva and Serevent.  OV 02/20/2021: Patient last seen in the office for evaluation of a lung nodule.  Was taken for bronchoscopy with tissue sampling and biopsy confirming non-small cell lung cancer.  Patient has established care with radiation oncology and completing treatments with SBRT.Consult from Dr. Lisbeth Renshaw on 12/12/2020 reviewed.  Patient does however have a new nodule in the left upper lobe in comparison to the previous CTs.  He had ongoing cough shortness of breath sputum production.  Recent CT scan in January showed a posterior infiltrate he was treated with 14 days of doxycycline.  He feels much better since then.  OV 05/06/2021: Here today for follow-up after recent CT scan of the chest.  Was also started on Trelegy.  Still using Trelegy.CT was completed on 05/01/2021 which revealed some new patchy airspace opacity and interstitial thickening in the right lung base.  There was interval decrease in the size of the pulmonary  lesions and other scattered nodules.  He has some stable coronary calcifications.  Discussed this with the patient today reviewed his imaging.  We reviewed his CT scan results.  From a respiratory standpoint he is breathing okay.  He is completing his aerobic exercise class on Monday and Friday.  OV 08/06/2021: Here today for follow-up.Patient here today for follow-up CT imaging in was completed on 07/23/2021.  CT reveals evolving radiation changes in the right lower lobe.  He has a few enlarging right lung nodules including a 6 mm nodule in the right upper lobe which is new from previous imaging.    Past Medical History:  Diagnosis Date   BPH (benign prostatic hyperplasia)    Cataracta    COPD (chronic obstructive pulmonary disease) ([place])    Depression    ED (erectile dysfunction)    GERD (gastroesophageal reflux disease)    Hypertension    Hypogonadism male    Lung cancer ([place]) 11/13/2020   Mixed hyperlipidemia    Palpitations    TIA (transient ischemic attack)    Vision abnormalities      Family History  Problem Relation Age of Onset   Stroke Father      Past Surgical History:  Procedure Laterality Date    catarcts surgery     BRONCHIAL BIOPSY  11/13/2020   Procedure: BRONCHIAL BIOPSIES;  Surgeon: Garner Nash, DO;  Location: [facility];  Service: Pulmonary;;   BRONCHIAL BRUSHINGS  11/13/2020   Procedure: BRONCHIAL BRUSHINGS;  Surgeon: Garner Nash, DO;  Location: [facility];  Service: Pulmonary;;   BRONCHIAL NEEDLE ASPIRATION BIOPSY  11/13/2020   Procedure: BRONCHIAL NEEDLE ASPIRATION BIOPSIES;  Surgeon: Garner Nash, DO;  Location: [facility];  Service: Pulmonary;;   COLONOSCOPY     COLONOSCOPY WITH PROPOFOL N/A 03/16/2020   Procedure: COLONOSCOPY WITH PROPOFOL;  Surgeon: Carol Ada, MD;  Location: [facility];  Service: Endoscopy;  Laterality: N/A;   ENTEROSCOPY N/A 03/16/2020   Procedure: ENTEROSCOPY;  Surgeon: Carol Ada, MD;  Location: [facility];   Service: Endoscopy;  Laterality: N/A;   HEMOSTASIS CLIP PLACEMENT  03/16/2020   Procedure: HEMOSTASIS CLIP PLACEMENT;  Surgeon: Carol Ada, MD;  Location: [facility];  Service: Endoscopy;;   HOT HEMOSTASIS N/A 03/16/2020   Procedure: HOT HEMOSTASIS (ARGON PLASMA COAGULATION/BICAP);  Surgeon: Carol Ada, MD;  Location: [facility];  Service: Endoscopy;  Laterality: N/A;   POLYPECTOMY  03/16/2020   Procedure: POLYPECTOMY;  Surgeon: Carol Ada, MD;  Location: [facility];  Service: Endoscopy;;   VIDEO BRONCHOSCOPY WITH ENDOBRONCHIAL NAVIGATION Right 11/13/2020   Procedure: VIDEO BRONCHOSCOPY WITH ENDOBRONCHIAL NAVIGATION;  Surgeon: Garner Nash, DO;  Location: [facility];  Service: Pulmonary;  Laterality: Right;  ION w/ fiducial placement   VIDEO BRONCHOSCOPY WITH RADIAL ENDOBRONCHIAL ULTRASOUND  11/13/2020   Procedure: RADIAL ENDOBRONCHIAL ULTRASOUND;  Surgeon: Garner Nash, DO;  Location: [facility];  Service: Pulmonary;;    Social History   Socioeconomic History   Marital status: Single    Spouse name: Not on file   Number of children: Not on file   Years of education: Not on file   Highest education level: Not on file  Occupational History   Not on file  Tobacco Use   Smoking status: Every Day    Packs/day: 1.00    Types: Cigarettes   Smokeless tobacco: Never   Tobacco comments:    Smokes 1 pack a day [facility] 11/19/20  Vaping Use   Vaping Use: Every day  Substance and Sexual Activity   Alcohol use: Yes    Alcohol/week: 1.0 standard drink of alcohol    Types: 1 Cans of beer per week    Comment: beer daily   Drug use: No   Sexual activity: Not on file  Other Topics Concern   Not on file  Social History Narrative   Not on file   Social Determinants of Health   Financial Resource Strain: Not on file  Food Insecurity: Not on file  Transportation Needs: Not on file  Physical Activity: Not on file  Stress: Not on file  Social Connections: Not on file   Intimate Partner Violence: Not At Risk (12/12/2020)   Humiliation, Afraid, Rape, and Kick questionnaire    Fear of Current or Ex-Partner: No    Emotionally Abused: No    Physically Abused: No    Sexually Abused: No     No Known Allergies   Outpatient Medications Prior to Visit  Medication Sig Dispense Refill   albuterol (VENTOLIN HFA) 108 (90 Base) MCG/ACT inhaler Inhale 2 puffs into the lungs every 6 (six) hours as needed for wheezing or shortness of breath.     cholecalciferol (VITAMIN D3) 25 MCG (1000 UNIT) tablet Take 1,000 Units by mouth daily.     citalopram (CELEXA) 40 MG tablet Take 40 mg by mouth daily.     clopidogrel (PLAVIX) 75 MG tablet Take 75 mg by mouth daily.  1   Fluticasone-Umeclidin-Vilant (TRELEGY ELLIPTA) 100-62.5-25 MCG/INH AEPB Inhale 1 puff into the lungs daily. [place]  each 6   losartan (COZAAR) 100 MG tablet Take 100 mg by mouth every morning.     omeprazole (PRILOSEC) 20 MG capsule Take 20 mg by mouth daily.     PRESCRIPTION MEDICATION Apply 1 mL topically daily. Testosterone Lipoderm 20% compounded at [facility] - apply to stomach or chest     rosuvastatin (CRESTOR) 5 MG tablet Take 5 mg by mouth daily.     tadalafil (CIALIS) 5 MG tablet Take 5 mg by mouth at bedtime.     tamsulosin (FLOMAX) 0.4 MG CAPS capsule Take 0.4 mg by mouth at bedtime.     No facility-administered medications prior to visit.    Review of Systems  Constitutional:  Negative for chills, fever, malaise/fatigue and weight loss.  HENT:  Negative for hearing loss, sore throat and tinnitus.   Eyes:  Negative for blurred vision and double vision.  Respiratory:  Positive for shortness of breath. Negative for cough, hemoptysis, sputum production, wheezing and stridor.   Cardiovascular:  Negative for chest pain, palpitations, orthopnea, leg swelling and PND.  Gastrointestinal:  Negative for abdominal pain, constipation, diarrhea, heartburn, nausea and vomiting.  Genitourinary:   Negative for dysuria, hematuria and urgency.  Musculoskeletal:  Negative for joint pain and myalgias.  Skin:  Negative for itching and rash.  Neurological:  Negative for dizziness, tingling, weakness and headaches.  Endo/Heme/Allergies:  Negative for environmental allergies. Does not bruise/bleed easily.  Psychiatric/Behavioral:  Negative for depression. The patient is not nervous/anxious and does not have insomnia.   All other systems reviewed and are negative.    Objective:  Physical Exam Vitals reviewed.  Constitutional:      General: He is not in acute distress.    Appearance: He is well-developed.  HENT:     Head: Normocephalic and atraumatic.  Eyes:     General: No scleral icterus.    Conjunctiva/sclera: Conjunctivae normal.     Pupils: Pupils are equal, round, and reactive to light.  Neck:     Vascular: No JVD.     Trachea: No tracheal deviation.  Cardiovascular:     Rate and Rhythm: Normal rate and regular rhythm.     Heart sounds: Normal heart sounds. No murmur heard. Pulmonary:     Effort: Pulmonary effort is normal. No tachypnea, accessory muscle usage or respiratory distress.     Breath sounds: No stridor. No wheezing, rhonchi or rales.  Abdominal:     General: There is no distension.     Palpations: Abdomen is soft.     Tenderness: There is no abdominal tenderness.  Musculoskeletal:        General: No tenderness.     Cervical back: Neck supple.  Lymphadenopathy:     Cervical: No cervical adenopathy.  Skin:    General: Skin is warm and dry.     Capillary Refill: Capillary refill takes less than 2 seconds.     Findings: No rash.  Neurological:     Mental Status: He is alert and oriented to person, place, and time.  Psychiatric:        Behavior: Behavior normal.      Vitals:   08/06/21 1448  BP: 138/76  Pulse: 83  Temp: 98.2 F (36.8 C)  TempSrc: Oral  SpO2: 96%  Weight: 140 lb (63.5 kg)  Height: 5\' 7"  (1.702 m)    96% on RA BMI Readings from  Last 3 Encounters:  08/06/21 21.93 kg/m  05/06/21 21.83 kg/m  02/20/21 21.18 kg/m  Wt Readings from Last 3 Encounters:  08/06/21 140 lb (63.5 kg)  05/06/21 139 lb 6.4 oz (63.2 kg)  02/20/21 135 lb 3.2 oz (61.3 kg)     CBC    Component Value Date/Time   WBC 7.3 05/29/2014 1325   RBC 5.08 05/29/2014 1325   HGB 17.0 05/29/2014 1325   HCT 47.1 05/29/2014 1325   PLT 152 05/29/2014 1325   MCV 92.7 05/29/2014 1325   MCH 33.5 05/29/2014 1325   MCHC 36.1 (H) 05/29/2014 1325   RDW 12.5 05/29/2014 1325    Chest Imaging: 09/26/2020: Nuclear medicine pet imaging reveals a subsolid pulmonary nodule within the right right lower lobe at 1.4 cm low-level SUV concerning for malignancy. Also splenic lesion which is hypermetabolic concerning for lymphoma versus metastasis. The patient's images have been independently reviewed by me.    January 2023 CT chest: Multiple pulmonary nodules, new in the left upper lobe.  Evidence of severe centrilobular emphysema. The patient's images have been independently reviewed by me.    July 2023 CT chest: Radiation changes of the right lower lobe, enlarging right lung nodules including a 6 mm inferior nodule of the right upper lobe new from previous imaging.  Other scattered small pulmonary nodules. The patient's images have been independently reviewed by me.      Pulmonary Functions Testing Results:    Latest Ref Rng & Units 09/13/2020   11:03 AM  PFT Results  FVC-Pre L 2.85   FVC-Predicted Pre % 71   FVC-Post L 3.35   FVC-Predicted Post % 84   Pre FEV1/FVC % % 50   Post FEV1/FCV % % 48   FEV1-Pre L 1.42   FEV1-Predicted Pre % 49   FEV1-Post L 1.60   DLCO uncorrected ml/min/mmHg 14.46   DLCO UNC% % 60   DLCO corrected ml/min/mmHg 14.46   DLCO COR %Predicted % 60   DLVA Predicted % 57   TLC L 8.20   TLC % Predicted % 123   RV % Predicted % 191     FeNO:   Pathology:   Echocardiogram:   Heart Catheterization:     Assessment &  Plan:     ICD-10-CM   1. Adenocarcinoma, lung, right (HCC)  C34.91     2. Lung nodule seen on imaging study  R91.1     3. Chronic obstructive pulmonary disease, unspecified COPD type ([place])  J44.9     4. Centrilobular emphysema ([place])  J43.2       Discussion:   This is a 76 year old gentleman, right lower lobe adenocarcinoma taken for SBRT.  Had hypermetabolic lesion in the spleen felt to be related to trauma.  He had subsequent serial CT image follow-ups that now show a new right upper lobe lung nodule that was not there previous.  Plan: Repeat 16-month noncontrasted CT chest. Continue Trelegy Continue exercise regimen. Follow-up with [place] in 3 months after his CT scan to discuss neck steps. If the nodule is getting bigger then I think we will need to consider biopsy. Patient is agreeable to this plan.    Current Outpatient Medications:    albuterol (VENTOLIN HFA) 108 (90 Base) MCG/ACT inhaler, Inhale 2 puffs into the lungs every 6 (six) hours as needed for wheezing or shortness of breath., Disp: , Rfl:    cholecalciferol (VITAMIN D3) 25 MCG (1000 UNIT) tablet, Take 1,000 Units by mouth daily., Disp: , Rfl:    citalopram (CELEXA) 40 MG tablet, Take 40 mg by mouth daily., Disp: ,  Rfl:    clopidogrel (PLAVIX) 75 MG tablet, Take 75 mg by mouth daily., Disp: , Rfl: 1   Fluticasone-Umeclidin-Vilant (TRELEGY ELLIPTA) 100-62.5-25 MCG/INH AEPB, Inhale 1 puff into the lungs daily., Disp: 60 each, Rfl: 6   losartan (COZAAR) 100 MG tablet, Take 100 mg by mouth every morning., Disp: , Rfl:    omeprazole (PRILOSEC) 20 MG capsule, Take 20 mg by mouth daily., Disp: , Rfl:    PRESCRIPTION MEDICATION, Apply 1 mL topically daily. Testosterone Lipoderm 20% compounded at [facility] - apply to stomach or chest, Disp: , Rfl:    rosuvastatin (CRESTOR) 5 MG tablet, Take 5 mg by mouth daily., Disp: , Rfl:    tadalafil (CIALIS) 5 MG tablet, Take 5 mg by mouth at bedtime., Disp: , Rfl:     tamsulosin (FLOMAX) 0.4 MG CAPS capsule, Take 0.4 mg by mouth at bedtime., Disp: , Rfl:   Garner Nash, DO [facility] 08/06/2021 2:56 PM

## 2021-10-15 ENCOUNTER — Other Ambulatory Visit (HOSPITAL_BASED_OUTPATIENT_CLINIC_OR_DEPARTMENT_OTHER): Payer: Self-pay

## 2021-10-15 MED ORDER — AREXVY 120 MCG/0.5ML IM SUSR
INTRAMUSCULAR | 0 refills | Status: DC
  Filled 2021-10-15: qty 0.5, 1d supply, fill #0

## 2021-10-15 MED ORDER — FLUAD QUADRIVALENT 0.5 ML IM PRSY
PREFILLED_SYRINGE | INTRAMUSCULAR | 0 refills | Status: DC
  Filled 2021-10-15: qty 0.5, 1d supply, fill #0

## 2021-10-21 ENCOUNTER — Ambulatory Visit: Payer: Medicare Other | Admitting: Radiation Oncology

## 2021-10-30 ENCOUNTER — Other Ambulatory Visit (HOSPITAL_BASED_OUTPATIENT_CLINIC_OR_DEPARTMENT_OTHER): Payer: Self-pay

## 2021-10-30 MED ORDER — COMIRNATY 30 MCG/0.3ML IM SUSY
PREFILLED_SYRINGE | INTRAMUSCULAR | 0 refills | Status: DC
  Filled 2021-10-30: qty 0.3, 1d supply, fill #0

## 2021-10-31 ENCOUNTER — Ambulatory Visit (HOSPITAL_COMMUNITY)
Admission: RE | Admit: 2021-10-31 | Discharge: 2021-10-31 | Disposition: A | Discharge status: Home / Self Care | Payer: Medicare Other | Source: Ambulatory Visit | Attending: Pulmonary Disease | Admitting: Pulmonary Disease

## 2021-10-31 DIAGNOSIS — C3491 Malignant neoplasm of unspecified part of right bronchus or lung: Secondary | ICD-10-CM | POA: Insufficient documentation

## 2021-10-31 DIAGNOSIS — R911 Solitary pulmonary nodule: Secondary | ICD-10-CM | POA: Diagnosis present

## 2021-11-06 ENCOUNTER — Encounter: Payer: Self-pay | Admitting: Pulmonary Disease

## 2021-11-06 ENCOUNTER — Ambulatory Visit: Payer: Medicare Other | Admitting: Pulmonary Disease

## 2021-11-06 VITALS — BP 130/80 | HR 70 | Ht 67.0 in | Wt 139.8 lb

## 2021-11-06 DIAGNOSIS — B370 Candidal stomatitis: Secondary | ICD-10-CM | POA: Diagnosis not present

## 2021-11-06 DIAGNOSIS — R918 Other nonspecific abnormal finding of lung field: Secondary | ICD-10-CM

## 2021-11-06 DIAGNOSIS — C3491 Malignant neoplasm of unspecified part of right bronchus or lung: Secondary | ICD-10-CM

## 2021-11-06 DIAGNOSIS — J432 Centrilobular emphysema: Secondary | ICD-10-CM | POA: Diagnosis not present

## 2021-11-06 MED ORDER — NYSTATIN 100000 UNIT/ML MT SUSP: 5.0000 mL | Freq: Four times a day (QID) | OROMUCOSAL | 0 refills | Status: DC

## 2021-11-06 MED ORDER — FLUCONAZOLE 150 MG PO TABS: 150.0000 mg | ORAL_TABLET | Freq: Every day | ORAL | 0 refills | Status: AC

## 2021-11-06 NOTE — Patient Instructions (Signed)
Thank you for visiting Dr. Valeta Harms at [facility]. Today we recommend the following:  Orders Placed This Encounter  Procedures   CT Chest Wo Contrast   Meds ordered this encounter  Medications   fluconazole (DIFLUCAN) 150 MG tablet    Sig: Take 1 tablet (150 mg total) by mouth daily for 3 days.    Dispense:  3 tablet    Refill:  0   nystatin (MYCOSTATIN) 100000 UNIT/ML suspension    Sig: Take 5 mLs (500,000 Units total) by mouth 4 (four) times daily.    Dispense:  60 mL    Refill:  0   Return in about 6 months (around 05/08/2022) for with Eric Form, NP, or Dr. Valeta Harms.    Please do your part to reduce the spread of COVID-19.

## 2021-11-06 NOTE — Progress Notes (Signed)
Synopsis: Referred in October 2022 for abnormal ct by Derinda Late, MD  Subjective:   PATIENT ID: Kevin Brooks GENDER: male DOB: 1945/04/05, MRN: [id number]  Chief Complaint  Patient presents with   Follow-up    CT    This is 76 yo M, medical history of COPD, emphysema, gastroesophageal reflux, longstanding history of tobacco abuse.  He has smoked since he was 76 years old.  He has been enrolled in lung cancer screening program.  He had a repeat lung cancer screening CT that was completed last month with revealed a right lower lobe subsolid lesion.  Patient underwent PET scan for this which revealed low-level metabolic uptake concerning for indolent neoplasm.  Additionally has a lesion found within his spleen that was hypermetabolic with SUV of 7.  Patient was referred today to discuss neck steps regarding lung nodule and consideration for biopsy.  He does have COPD currently managed with Spiriva and Serevent.  OV 02/20/2021: Patient last seen in the office for evaluation of a lung nodule.  Was taken for bronchoscopy with tissue sampling and biopsy confirming non-small cell lung cancer.  Patient has established care with radiation oncology and completing treatments with SBRT.Consult from Dr. Lisbeth Renshaw on 12/12/2020 reviewed.  Patient does however have a new nodule in the left upper lobe in comparison to the previous CTs.  He had ongoing cough shortness of breath sputum production.  Recent CT scan in January showed a posterior infiltrate he was treated with 14 days of doxycycline.  He feels much better since then.  OV 05/06/2021: Here today for follow-up after recent CT scan of the chest.  Was also started on Trelegy.  Still using Trelegy.CT was completed on 05/01/2021 which revealed some new patchy airspace opacity and interstitial thickening in the right lung base.  There was interval decrease in the size of the pulmonary lesions and other scattered nodules.  He has some stable coronary  calcifications.  Discussed this with the patient today reviewed his imaging.  We reviewed his CT scan results.  From a respiratory standpoint he is breathing okay.  He is completing his aerobic exercise class on Monday and Friday.  OV 08/06/2021: Here today for follow-up.Patient here today for follow-up CT imaging in was completed on 07/23/2021.  CT reveals evolving radiation changes in the right lower lobe.  He has a few enlarging right lung nodules including a 6 mm nodule in the right upper lobe which is new from previous imaging.  OV 11/06/2021: Here today for follow-up regarding COPD and recent CT imaging of the chest.  Nodules that we were following shows some resolution he does have a new small nodule in the right upper lobe.  He can has some waxing and waning nodular infiltrates in the past.  We reviewed these today in the office.  I think he needs a repeat CT in 6 months.  He has been using his Trelegy daily.  He had some soreness in his throat recently went to the dentist was diagnosed with thrush.  He is not taking anything for this.  He is rinsing his mouth out after use.    Past Medical History:  Diagnosis Date   BPH (benign prostatic hyperplasia)    Cataracta    COPD (chronic obstructive pulmonary disease) ([place])    Depression    ED (erectile dysfunction)    GERD (gastroesophageal reflux disease)    Hypertension    Hypogonadism male    Lung cancer ([place]) 11/13/2020   Mixed  hyperlipidemia    Palpitations    TIA (transient ischemic attack)    Vision abnormalities      Family History  Problem Relation Age of Onset   Stroke Father      Past Surgical History:  Procedure Laterality Date    catarcts surgery     BRONCHIAL BIOPSY  11/13/2020   Procedure: BRONCHIAL BIOPSIES;  Surgeon: Garner Nash, DO;  Location: [facility];  Service: Pulmonary;;   BRONCHIAL BRUSHINGS  11/13/2020   Procedure: BRONCHIAL BRUSHINGS;  Surgeon: Garner Nash, DO;  Location: [facility];   Service: Pulmonary;;   BRONCHIAL NEEDLE ASPIRATION BIOPSY  11/13/2020   Procedure: BRONCHIAL NEEDLE ASPIRATION BIOPSIES;  Surgeon: Garner Nash, DO;  Location: [facility];  Service: Pulmonary;;   COLONOSCOPY     COLONOSCOPY WITH PROPOFOL N/A 03/16/2020   Procedure: COLONOSCOPY WITH PROPOFOL;  Surgeon: Carol Ada, MD;  Location: [facility];  Service: Endoscopy;  Laterality: N/A;   ENTEROSCOPY N/A 03/16/2020   Procedure: ENTEROSCOPY;  Surgeon: Carol Ada, MD;  Location: [facility];  Service: Endoscopy;  Laterality: N/A;   HEMOSTASIS CLIP PLACEMENT  03/16/2020   Procedure: HEMOSTASIS CLIP PLACEMENT;  Surgeon: Carol Ada, MD;  Location: [facility];  Service: Endoscopy;;   HOT HEMOSTASIS N/A 03/16/2020   Procedure: HOT HEMOSTASIS (ARGON PLASMA COAGULATION/BICAP);  Surgeon: Carol Ada, MD;  Location: [facility];  Service: Endoscopy;  Laterality: N/A;   POLYPECTOMY  03/16/2020   Procedure: POLYPECTOMY;  Surgeon: Carol Ada, MD;  Location: [facility];  Service: Endoscopy;;   VIDEO BRONCHOSCOPY WITH ENDOBRONCHIAL NAVIGATION Right 11/13/2020   Procedure: VIDEO BRONCHOSCOPY WITH ENDOBRONCHIAL NAVIGATION;  Surgeon: Garner Nash, DO;  Location: [facility];  Service: Pulmonary;  Laterality: Right;  ION w/ fiducial placement   VIDEO BRONCHOSCOPY WITH RADIAL ENDOBRONCHIAL ULTRASOUND  11/13/2020   Procedure: RADIAL ENDOBRONCHIAL ULTRASOUND;  Surgeon: Garner Nash, DO;  Location: [facility];  Service: Pulmonary;;    Social History   Socioeconomic History   Marital status: Single    Spouse name: Not on file   Number of children: Not on file   Years of education: Not on file   Highest education level: Not on file  Occupational History   Not on file  Tobacco Use   Smoking status: Every Day    Packs/day: 1.00    Types: Cigarettes   Smokeless tobacco: Never   Tobacco comments:    Smokes 7 packs a week. 11/06/2021 [place]  Vaping Use   Vaping Use: Every day  Substance and  Sexual Activity   Alcohol use: Yes    Alcohol/week: 1.0 standard drink of alcohol    Types: 1 Cans of beer per week    Comment: beer daily   Drug use: No   Sexual activity: Not on file  Other Topics Concern   Not on file  Social History Narrative   Not on file   Social Determinants of Health   Financial Resource Strain: Not on file  Food Insecurity: Not on file  Transportation Needs: Not on file  Physical Activity: Not on file  Stress: Not on file  Social Connections: Not on file  Intimate Partner Violence: Not At Risk (12/12/2020)   Humiliation, Afraid, Rape, and Kick questionnaire    Fear of Current or Ex-Partner: No    Emotionally Abused: No    Physically Abused: No    Sexually Abused: No     No Known Allergies   Outpatient Medications Prior to Visit  Medication Sig Dispense  Refill   albuterol (VENTOLIN HFA) 108 (90 Base) MCG/ACT inhaler Inhale 2 puffs into the lungs every 6 (six) hours as needed for wheezing or shortness of breath.     Fluticasone-Umeclidin-Vilant (TRELEGY ELLIPTA) 100-62.5-25 MCG/INH AEPB Inhale 1 puff into the lungs daily. 60 each 6   cholecalciferol (VITAMIN D3) 25 MCG (1000 UNIT) tablet Take 1,000 Units by mouth daily.     citalopram (CELEXA) 40 MG tablet Take 40 mg by mouth daily.     clopidogrel (PLAVIX) 75 MG tablet Take 75 mg by mouth daily.  1   COVID-19 mRNA vaccine 2023-2024 (COMIRNATY) syringe Inject into the muscle. 0.3 mL 0   influenza vaccine adjuvanted (FLUAD QUADRIVALENT) 0.5 ML injection Inject into the muscle. 0.5 mL 0   losartan (COZAAR) 100 MG tablet Take 100 mg by mouth every morning.     omeprazole (PRILOSEC) 20 MG capsule Take 20 mg by mouth daily.     PRESCRIPTION MEDICATION Apply 1 mL topically daily. Testosterone Lipoderm 20% compounded at [place] - apply to stomach or chest     rosuvastatin (CRESTOR) 5 MG tablet Take 5 mg by mouth daily.     RSV vaccine recomb adjuvanted (AREXVY) 120 MCG/0.5ML injection Inject  into the muscle. 0.5 mL 0   tadalafil (CIALIS) 5 MG tablet Take 5 mg by mouth at bedtime.     tamsulosin (FLOMAX) 0.4 MG CAPS capsule Take 0.4 mg by mouth at bedtime.     No facility-administered medications prior to visit.    Review of Systems  Constitutional:  Negative for chills, fever, malaise/fatigue and weight loss.  HENT:  Negative for hearing loss, sore throat and tinnitus.   Eyes:  Negative for blurred vision and double vision.  Respiratory:  Positive for shortness of breath. Negative for cough, hemoptysis, sputum production, wheezing and stridor.   Cardiovascular:  Negative for chest pain, palpitations, orthopnea, leg swelling and PND.  Gastrointestinal:  Negative for abdominal pain, constipation, diarrhea, heartburn, nausea and vomiting.  Genitourinary:  Negative for dysuria, hematuria and urgency.  Musculoskeletal:  Negative for joint pain and myalgias.  Skin:  Negative for itching and rash.  Neurological:  Negative for dizziness, tingling, weakness and headaches.  Endo/Heme/Allergies:  Negative for environmental allergies. Does not bruise/bleed easily.  Psychiatric/Behavioral:  Negative for depression. The patient is not nervous/anxious and does not have insomnia.   All other systems reviewed and are negative.    Objective:  Physical Exam Vitals reviewed.  Constitutional:      General: He is not in acute distress.    Appearance: He is well-developed.  HENT:     Head: Normocephalic and atraumatic.  Eyes:     General: No scleral icterus.    Conjunctiva/sclera: Conjunctivae normal.     Pupils: Pupils are equal, round, and reactive to light.  Neck:     Vascular: No JVD.     Trachea: No tracheal deviation.  Cardiovascular:     Rate and Rhythm: Normal rate and regular rhythm.     Heart sounds: Normal heart sounds. No murmur heard. Pulmonary:     Effort: Pulmonary effort is normal. No tachypnea, accessory muscle usage or respiratory distress.     Breath sounds: No  stridor. No wheezing, rhonchi or rales.     Comments: Diminished breath sounds bilaterally Abdominal:     General: Bowel sounds are normal. There is no distension.     Palpations: Abdomen is soft.     Tenderness: There is no abdominal tenderness.  Musculoskeletal:        General: No tenderness.     Cervical back: Neck supple.  Lymphadenopathy:     Cervical: No cervical adenopathy.  Skin:    General: Skin is warm and dry.     Capillary Refill: Capillary refill takes less than 2 seconds.     Findings: No rash.  Neurological:     Mental Status: He is alert and oriented to person, place, and time.  Psychiatric:        Behavior: Behavior normal.      Vitals:   11/06/21 1037  BP: 130/80  Pulse: 70  SpO2: 97%  Weight: 139 lb 12.8 oz (63.4 kg)  Height: 5\' 7"  (1.702 m)    97% on RA BMI Readings from Last 3 Encounters:  11/06/21 21.90 kg/m  08/06/21 21.93 kg/m  05/06/21 21.83 kg/m   Wt Readings from Last 3 Encounters:  11/06/21 139 lb 12.8 oz (63.4 kg)  08/06/21 140 lb (63.5 kg)  05/06/21 139 lb 6.4 oz (63.2 kg)     CBC    Component Value Date/Time   WBC 7.3 05/29/2014 1325   RBC 5.08 05/29/2014 1325   HGB 17.0 05/29/2014 1325   HCT 47.1 05/29/2014 1325   PLT 152 05/29/2014 1325   MCV 92.7 05/29/2014 1325   MCH 33.5 05/29/2014 1325   MCHC 36.1 (H) 05/29/2014 1325   RDW 12.5 05/29/2014 1325    Chest Imaging: 09/26/2020: Nuclear medicine pet imaging reveals a subsolid pulmonary nodule within the right right lower lobe at 1.4 cm low-level SUV concerning for malignancy. Also splenic lesion which is hypermetabolic concerning for lymphoma versus metastasis. The patient's images have been independently reviewed by me.    January 2023 CT chest: Multiple pulmonary nodules, new in the left upper lobe.  Evidence of severe centrilobular emphysema. The patient's images have been independently reviewed by me.    July 2023 CT chest: Radiation changes of the right lower  lobe, enlarging right lung nodules including a 6 mm inferior nodule of the right upper lobe new from previous imaging.  Other scattered small pulmonary nodules. The patient's images have been independently reviewed by me.    October 2023 CT chest: Radiation changes to the right lower lobe.  Scattered nodules within the chest changing in size suggesting inflammatory lesion.  However he remains high risk for malignancy due to his prior history. Has evidence of centrilobular emphysema. The patient's images have been independently reviewed by me.      Pulmonary Functions Testing Results:    Latest Ref Rng & Units 09/13/2020   11:03 AM  PFT Results  FVC-Pre L 2.85   FVC-Predicted Pre % 71   FVC-Post L 3.35   FVC-Predicted Post % 84   Pre FEV1/FVC % % 50   Post FEV1/FCV % % 48   FEV1-Pre L 1.42   FEV1-Predicted Pre % 49   FEV1-Post L 1.60   DLCO uncorrected ml/min/mmHg 14.46   DLCO UNC% % 60   DLCO corrected ml/min/mmHg 14.46   DLCO COR %Predicted % 60   DLVA Predicted % 57   TLC L 8.20   TLC % Predicted % 123   RV % Predicted % 191     FeNO:   Pathology:   Echocardiogram:   Heart Catheterization:     Assessment & Plan:     ICD-10-CM   1. Lung nodules  R91.8 CT Chest Wo Contrast    2. Adenocarcinoma, lung, right (HCC)  C34.91 CT  Chest Wo Contrast    3. Centrilobular emphysema ([place])  J43.2 CT Chest Wo Contrast    4. Thrush  B37.0       Discussion:  This is a 76 year old gentleman, right lower lobe adenocarcinoma status post SBRT now with radiation changes in the lower lobe no evidence of recurrence.  Others multiple small pulmonary nodules.  Has ongoing centrilobular emphysema now dealing with thrush likely related to his inhaler.  Plan: Fluconazole 150 mg once daily x3 days Nystatin swish and swallow to be used if his thrush comes back. We may need to consider switching his inhalers. Next step would be consider Breztri with a spacer to try to get rid of the  DPI. Repeat noncontrasted CT scan of the chest in 6 months. Return to clinic in 6 months.   Current Outpatient Medications:    albuterol (VENTOLIN HFA) 108 (90 Base) MCG/ACT inhaler, Inhale 2 puffs into the lungs every 6 (six) hours as needed for wheezing or shortness of breath., Disp: , Rfl:    fluconazole (DIFLUCAN) 150 MG tablet, Take 1 tablet (150 mg total) by mouth daily for 3 days., Disp: 3 tablet, Rfl: 0   Fluticasone-Umeclidin-Vilant (TRELEGY ELLIPTA) 100-62.5-25 MCG/INH AEPB, Inhale 1 puff into the lungs daily., Disp: 60 each, Rfl: 6   nystatin (MYCOSTATIN) 100000 UNIT/ML suspension, Take 5 mLs (500,000 Units total) by mouth 4 (four) times daily., Disp: 60 mL, Rfl: 0   cholecalciferol (VITAMIN D3) 25 MCG (1000 UNIT) tablet, Take 1,000 Units by mouth daily., Disp: , Rfl:    citalopram (CELEXA) 40 MG tablet, Take 40 mg by mouth daily., Disp: , Rfl:    clopidogrel (PLAVIX) 75 MG tablet, Take 75 mg by mouth daily., Disp: , Rfl: 1   COVID-19 mRNA vaccine 2023-2024 (COMIRNATY) syringe, Inject into the muscle., Disp: 0.3 mL, Rfl: 0   influenza vaccine adjuvanted (FLUAD QUADRIVALENT) 0.5 ML injection, Inject into the muscle., Disp: 0.5 mL, Rfl: 0   losartan (COZAAR) 100 MG tablet, Take 100 mg by mouth every morning., Disp: , Rfl:    omeprazole (PRILOSEC) 20 MG capsule, Take 20 mg by mouth daily., Disp: , Rfl:    PRESCRIPTION MEDICATION, Apply 1 mL topically daily. Testosterone Lipoderm 20% compounded at [place] - apply to stomach or chest, Disp: , Rfl:    rosuvastatin (CRESTOR) 5 MG tablet, Take 5 mg by mouth daily., Disp: , Rfl:    RSV vaccine recomb adjuvanted (AREXVY) 120 MCG/0.5ML injection, Inject into the muscle., Disp: 0.5 mL, Rfl: 0   tadalafil (CIALIS) 5 MG tablet, Take 5 mg by mouth at bedtime., Disp: , Rfl:    tamsulosin (FLOMAX) 0.4 MG CAPS capsule, Take 0.4 mg by mouth at bedtime., Disp: , Rfl:     Garner Nash, DO [facility] 11/06/2021  10:57 AM

## 2021-12-30 ENCOUNTER — Observation Stay (HOSPITAL_BASED_OUTPATIENT_CLINIC_OR_DEPARTMENT_OTHER)
Admission: EM | Admit: 2021-12-30 | Discharge: 2022-01-01 | Disposition: A | Discharge status: Home / Self Care | Payer: Medicare Other | Attending: Internal Medicine | Admitting: Internal Medicine

## 2021-12-30 ENCOUNTER — Emergency Department (HOSPITAL_BASED_OUTPATIENT_CLINIC_OR_DEPARTMENT_OTHER): Payer: Medicare Other

## 2021-12-30 ENCOUNTER — Other Ambulatory Visit: Payer: Self-pay

## 2021-12-30 DIAGNOSIS — J9601 Acute respiratory failure with hypoxia: Principal | ICD-10-CM | POA: Insufficient documentation

## 2021-12-30 DIAGNOSIS — Z79899 Other long term (current) drug therapy: Secondary | ICD-10-CM | POA: Insufficient documentation

## 2021-12-30 DIAGNOSIS — I10 Essential (primary) hypertension: Secondary | ICD-10-CM | POA: Diagnosis not present

## 2021-12-30 DIAGNOSIS — Z1152 Encounter for screening for COVID-19: Secondary | ICD-10-CM | POA: Insufficient documentation

## 2021-12-30 DIAGNOSIS — Z8673 Personal history of transient ischemic attack (TIA), and cerebral infarction without residual deficits: Secondary | ICD-10-CM | POA: Insufficient documentation

## 2021-12-30 DIAGNOSIS — Z85118 Personal history of other malignant neoplasm of bronchus and lung: Secondary | ICD-10-CM | POA: Diagnosis not present

## 2021-12-30 DIAGNOSIS — Z7902 Long term (current) use of antithrombotics/antiplatelets: Secondary | ICD-10-CM | POA: Diagnosis not present

## 2021-12-30 DIAGNOSIS — R0602 Shortness of breath: Secondary | ICD-10-CM | POA: Diagnosis present

## 2021-12-30 DIAGNOSIS — R0902 Hypoxemia: Principal | ICD-10-CM

## 2021-12-30 DIAGNOSIS — J101 Influenza due to other identified influenza virus with other respiratory manifestations: Secondary | ICD-10-CM | POA: Diagnosis not present

## 2021-12-30 DIAGNOSIS — J449 Chronic obstructive pulmonary disease, unspecified: Secondary | ICD-10-CM | POA: Insufficient documentation

## 2021-12-30 DIAGNOSIS — J111 Influenza due to unidentified influenza virus with other respiratory manifestations: Secondary | ICD-10-CM

## 2021-12-30 DIAGNOSIS — E871 Hypo-osmolality and hyponatremia: Secondary | ICD-10-CM

## 2021-12-30 DIAGNOSIS — F17210 Nicotine dependence, cigarettes, uncomplicated: Secondary | ICD-10-CM | POA: Insufficient documentation

## 2021-12-30 DIAGNOSIS — Z7952 Long term (current) use of systemic steroids: Secondary | ICD-10-CM | POA: Diagnosis not present

## 2021-12-30 NOTE — ED Provider Notes (Signed)
[facility] EMERGENCY DEPT Provider Note   CSN: [id number] Arrival date & time: 12/30/21  2257     History {Add pertinent medical, surgical, social history, OB history to HPI:1} Chief Complaint  Patient presents with   Shortness of Breath   Cough    Kevin Brooks is a 76 y.o. male.  HPI     This is a 76 year old male with a history of COPD and ongoing smoking who presents with shortness of breath.  Patient reports worsening shortness of breath since yesterday morning.  He has had a cough.  No noted fevers.  He has been using his inhaler more frequently with minimal relief.  He is not normally on oxygen but was hypoxic for EMS.  No known sick contacts.  Reports chest pain but only with coughing.  Home Medications Prior to Admission medications   Medication Sig Start Date End Date Taking? Authorizing Provider  albuterol (VENTOLIN HFA) 108 (90 Base) MCG/ACT inhaler Inhale 2 puffs into the lungs every 6 (six) hours as needed for wheezing or shortness of breath.    [provider]  cholecalciferol (VITAMIN D3) 25 MCG (1000 UNIT) tablet Take 1,000 Units by mouth daily.    [provider]  citalopram (CELEXA) 40 MG tablet Take 40 mg by mouth daily. 08/18/11   [provider]  clopidogrel (PLAVIX) 75 MG tablet Take 75 mg by mouth daily. 11/15/16   [provider]  COVID-19 mRNA vaccine [phone] (COMIRNATY) syringe Inject into the muscle. 10/30/21     Fluticasone-Umeclidin-Vilant (TRELEGY ELLIPTA) 100-62.5-25 MCG/INH AEPB Inhale 1 puff into the lungs daily. 10/22/20   June Leap L, DO  influenza vaccine adjuvanted (FLUAD QUADRIVALENT) 0.5 ML injection Inject into the muscle. 10/14/21   Carlyle Basques, MD  losartan (COZAAR) 100 MG tablet Take 100 mg by mouth every morning. 02/12/19   [provider]  nystatin (MYCOSTATIN) 100000 UNIT/ML suspension Take 5 mLs (500,000 Units total) by mouth 4 (four) times daily. 11/06/21   Icard,  Octavio Graves, DO  omeprazole (PRILOSEC) 20 MG capsule Take 20 mg by mouth daily. 03/12/19   [provider]  PRESCRIPTION MEDICATION Apply 1 mL topically daily. Testosterone Lipoderm 20% compounded at [place] - apply to stomach or chest    [provider]  rosuvastatin (CRESTOR) 5 MG tablet Take 5 mg by mouth daily.    [provider]  RSV vaccine recomb adjuvanted (AREXVY) 120 MCG/0.5ML injection Inject into the muscle. 10/15/21   Carlyle Basques, MD  tadalafil (CIALIS) 5 MG tablet Take 5 mg by mouth at bedtime.    [provider]  tamsulosin (FLOMAX) 0.4 MG CAPS capsule Take 0.4 mg by mouth at bedtime. 01/01/19   [provider]      Allergies    Patient has no known allergies.    Review of Systems   Review of Systems  Constitutional:  Negative for fever.  Respiratory:  Positive for cough and shortness of breath.   Cardiovascular:  Positive for chest pain. Negative for leg swelling.  All other systems reviewed and are negative.   Physical Exam Updated Vital Signs BP (!) 181/97   Pulse (!) 105   Temp 98.8 F (37.1 C) (Oral)   Resp (!) 26   Wt 61.7 kg   SpO2 96%   BMI 21.30 kg/m  Physical Exam Vitals and nursing note reviewed.  Constitutional:      Appearance: He is well-developed. He is not ill-appearing.  HENT:  Head: Normocephalic and atraumatic.     Mouth/Throat:     Comments: Mucous membranes dry Eyes:     Pupils: Pupils are equal, round, and reactive to light.  Cardiovascular:     Rate and Rhythm: Regular rhythm. Tachycardia present.     Heart sounds: Normal heart sounds. No murmur heard. Pulmonary:     Effort: Pulmonary effort is normal. No respiratory distress.     Breath sounds: Decreased breath sounds present. No wheezing.     Comments: Diminished breath sounds in all lung fields, no obvious wheeze, no respiratory distress Abdominal:     General: Bowel sounds are normal.     Palpations: Abdomen is  soft.     Tenderness: There is no abdominal tenderness. There is no rebound.  Musculoskeletal:     Cervical back: Neck supple.     Right lower leg: No edema.     Left lower leg: No edema.  Lymphadenopathy:     Cervical: No cervical adenopathy.  Skin:    General: Skin is warm and dry.  Neurological:     Mental Status: He is alert and oriented to person, place, and time.  Psychiatric:        Mood and Affect: Mood normal.     ED Results / Procedures / Treatments   Labs (all labs ordered are listed, but only abnormal results are displayed) Labs Reviewed  RESP PANEL BY RT-PCR (RSV, FLU A&B, COVID)  [id number]  CBC WITH DIFFERENTIAL/PLATELET  BASIC METABOLIC PANEL  LACTIC ACID, PLASMA  LACTIC ACID, PLASMA    EKG EKG Interpretation  Date/Time:  Monday December 30 2021 23:02:50 EST Ventricular Rate:  110 PR Interval:  187 QRS Duration: 95 QT Interval:  329 QTC Calculation: 445 R Axis:   260 Text Interpretation: Sinus tachycardia Probable left atrial enlargement Left anterior fascicular block Confirmed by Davonna Belling [id number]) on 12/30/2021 11:05:04 PM  Radiology No results found.  Procedures Procedures  {Document cardiac monitor, telemetry assessment procedure when appropriate:1}  Medications Ordered in ED Medications - No data to display  ED Course/ Medical Decision Making/ A&P                           Medical Decision Making Amount and/or Complexity of Data Reviewed Labs: ordered. Radiology: ordered.   ***  {Document critical care time when appropriate:1} {Document review of labs and clinical decision tools ie heart score, Chads2Vasc2 etc:1}  {Document your independent review of radiology images, and any outside records:1} {Document your discussion with family members, caretakers, and with consultants:1} {Document social determinants of health affecting pt's care:1} {Document your decision making why or why not admission, treatments were needed:1} Final  Clinical Impression(s) / ED Diagnoses Final diagnoses:  None    Rx / DC Orders ED Discharge Orders     None

## 2021-12-30 NOTE — ED Triage Notes (Signed)
Pt in from [facility], c/o sob, cough x 2 days. Pt's initial sats 86% on RA for EMS, arrives on 4LNC. Hx of COPD and lung CA. Hypertensive in triage - has not taken home meds today

## 2021-12-31 ENCOUNTER — Encounter (HOSPITAL_BASED_OUTPATIENT_CLINIC_OR_DEPARTMENT_OTHER): Payer: Self-pay | Admitting: Family Medicine

## 2021-12-31 ENCOUNTER — Encounter (HOSPITAL_COMMUNITY): Payer: Self-pay

## 2021-12-31 DIAGNOSIS — J9601 Acute respiratory failure with hypoxia: Secondary | ICD-10-CM | POA: Diagnosis not present

## 2021-12-31 DIAGNOSIS — J101 Influenza due to other identified influenza virus with other respiratory manifestations: Secondary | ICD-10-CM

## 2021-12-31 LAB — CBC WITH DIFFERENTIAL/PLATELET
Abs Immature Granulocytes: 0.03 10*3/uL (ref 0.00–0.07)
Basophils Absolute: 0.1 10*3/uL (ref 0.0–0.1)
Basophils Relative: 1 %
Eosinophils Absolute: 0 10*3/uL (ref 0.0–0.5)
Eosinophils Relative: 0 %
HCT: 42.5 % (ref 39.0–52.0)
Hemoglobin: 15.4 g/dL (ref 13.0–17.0)
Immature Granulocytes: 0 %
Lymphocytes Relative: 8 %
Lymphs Abs: 0.7 10*3/uL (ref 0.7–4.0)
MCH: 34.1 pg — ABNORMAL HIGH (ref 26.0–34.0)
MCHC: 36.2 g/dL — ABNORMAL HIGH (ref 30.0–36.0)
MCV: 94.2 fL (ref 80.0–100.0)
Monocytes Absolute: 0.7 10*3/uL (ref 0.1–1.0)
Monocytes Relative: 7 %
Neutro Abs: 7.7 10*3/uL (ref 1.7–7.7)
Neutrophils Relative %: 84 %
Platelets: 127 10*3/uL — ABNORMAL LOW (ref 150–400)
RBC: 4.51 MIL/uL (ref 4.22–5.81)
RDW: 12.1 % (ref 11.5–15.5)
WBC: 9.2 10*3/uL (ref 4.0–10.5)
nRBC: 0 % (ref 0.0–0.2)

## 2021-12-31 LAB — BASIC METABOLIC PANEL
Anion gap: 10 (ref 5–15)
BUN: 13 mg/dL (ref 8–23)
CO2: 25 mmol/L (ref 22–32)
Calcium: 9.1 mg/dL (ref 8.9–10.3)
Chloride: 95 mmol/L — ABNORMAL LOW (ref 98–111)
Creatinine, Ser: 0.71 mg/dL (ref 0.61–1.24)
GFR, Estimated: >60 mL/min (ref >60)
Glucose, Bld: 112 mg/dL — ABNORMAL HIGH (ref 70–99)
Potassium: 3.5 mmol/L (ref 3.5–5.1)
Sodium: 130 mmol/L — ABNORMAL LOW (ref 135–145)

## 2021-12-31 LAB — URINALYSIS, ROUTINE W REFLEX MICROSCOPIC
Bilirubin Urine: NEGATIVE
Glucose, UA: NEGATIVE mg/dL
Ketones, ur: NEGATIVE mg/dL
Leukocytes,Ua: NEGATIVE
Nitrite: NEGATIVE
RBC / HPF: >50 RBC/hpf — ABNORMAL HIGH (ref 0–5)
Specific Gravity, Urine: 1.009 (ref 1.005–1.030)
pH: 7 (ref 5.0–8.0)

## 2021-12-31 LAB — CBC
HCT: 45.5 % (ref 39.0–52.0)
Hemoglobin: 16.2 g/dL (ref 13.0–17.0)
MCH: 33.8 pg (ref 26.0–34.0)
MCHC: 35.6 g/dL (ref 30.0–36.0)
MCV: 95 fL (ref 80.0–100.0)
Platelets: 159 10*3/uL (ref 150–400)
RBC: 4.79 MIL/uL (ref 4.22–5.81)
RDW: 11.9 % (ref 11.5–15.5)
WBC: 8.3 10*3/uL (ref 4.0–10.5)
nRBC: 0 % (ref 0.0–0.2)

## 2021-12-31 LAB — RESP PANEL BY RT-PCR (RSV, FLU A&B, COVID)  RVPGX2
Influenza A by PCR: POSITIVE — AB
Influenza B by PCR: NEGATIVE
Resp Syncytial Virus by PCR: NEGATIVE
SARS Coronavirus 2 by RT PCR: NEGATIVE

## 2021-12-31 LAB — CREATININE, SERUM
Creatinine, Ser: 0.67 mg/dL (ref 0.61–1.24)
GFR, Estimated: >60 mL/min (ref >60)

## 2021-12-31 LAB — TSH: TSH: 4.247 u[IU]/mL (ref 0.350–4.500)

## 2021-12-31 LAB — TROPONIN I (HIGH SENSITIVITY)
Troponin I (High Sensitivity): 10 ng/L (ref <18)
Troponin I (High Sensitivity): 14 ng/L (ref <18)

## 2021-12-31 LAB — LACTIC ACID, PLASMA
Lactic Acid, Venous: 0.7 mmol/L (ref 0.5–1.9)
Lactic Acid, Venous: 0.8 mmol/L (ref 0.5–1.9)

## 2021-12-31 MED ORDER — VITAMIN D 25 MCG (1000 UNIT) PO TABS
1000.0000 [IU] | ORAL_TABLET | Freq: Every day | ORAL | Status: DC
  Administered 2022-01-01: 1000 [IU] via ORAL
  Filled 2021-12-31: qty 1

## 2021-12-31 MED ORDER — TAMSULOSIN HCL 0.4 MG PO CAPS
0.4000 mg | ORAL_CAPSULE | Freq: Every day | ORAL | Status: DC
  Administered 2021-12-31: 0.4 mg via ORAL
  Filled 2021-12-31: qty 1

## 2021-12-31 MED ORDER — FLUTICASONE FUROATE-VILANTEROL 100-25 MCG/ACT IN AEPB
1.0000 | INHALATION_SPRAY | Freq: Every day | RESPIRATORY_TRACT | Status: DC
  Filled 2021-12-31: qty 28

## 2021-12-31 MED ORDER — CITALOPRAM HYDROBROMIDE 20 MG PO TABS
40.0000 mg | ORAL_TABLET | Freq: Every day | ORAL | Status: DC
  Administered 2021-12-31 – 2022-01-01 (×2): 40 mg via ORAL
  Filled 2021-12-31 (×2): qty 2

## 2021-12-31 MED ORDER — DEXAMETHASONE SODIUM PHOSPHATE 10 MG/ML IJ SOLN
10.0000 mg | Freq: Once | INTRAMUSCULAR | Status: AC
  Administered 2021-12-31: 10 mg via INTRAVENOUS
  Filled 2021-12-31: qty 1

## 2021-12-31 MED ORDER — OSELTAMIVIR PHOSPHATE 75 MG PO CAPS
75.0000 mg | ORAL_CAPSULE | Freq: Two times a day (BID) | ORAL | Status: DC
  Administered 2021-12-31 – 2022-01-01 (×4): 75 mg via ORAL
  Filled 2021-12-31 (×4): qty 1

## 2021-12-31 MED ORDER — ACETAMINOPHEN 325 MG PO TABS
650.0000 mg | ORAL_TABLET | Freq: Four times a day (QID) | ORAL | Status: DC | PRN
  Administered 2021-12-31: 650 mg via ORAL
  Filled 2021-12-31: qty 2

## 2021-12-31 MED ORDER — PREDNISONE 20 MG PO TABS
40.0000 mg | ORAL_TABLET | Freq: Every day | ORAL | Status: DC
  Administered 2022-01-01: 40 mg via ORAL
  Filled 2021-12-31: qty 2

## 2021-12-31 MED ORDER — ALBUTEROL SULFATE (2.5 MG/3ML) 0.083% IN NEBU: 2.5000 mg | INHALATION_SOLUTION | RESPIRATORY_TRACT | Status: DC | PRN

## 2021-12-31 MED ORDER — ACETAMINOPHEN 325 MG PO TABS
650.0000 mg | ORAL_TABLET | Freq: Once | ORAL | Status: AC
  Administered 2021-12-31: 650 mg via ORAL
  Filled 2021-12-31: qty 2

## 2021-12-31 MED ORDER — ONDANSETRON HCL 4 MG/2ML IJ SOLN: 4.0000 mg | Freq: Four times a day (QID) | INTRAMUSCULAR | Status: DC | PRN

## 2021-12-31 MED ORDER — IPRATROPIUM-ALBUTEROL 0.5-2.5 (3) MG/3ML IN SOLN
3.0000 mL | Freq: Three times a day (TID) | RESPIRATORY_TRACT | Status: DC
  Administered 2022-01-01: 3 mL via RESPIRATORY_TRACT
  Filled 2021-12-31: qty 3

## 2021-12-31 MED ORDER — ONDANSETRON HCL 4 MG PO TABS: 4.0000 mg | ORAL_TABLET | Freq: Four times a day (QID) | ORAL | Status: DC | PRN

## 2021-12-31 MED ORDER — IPRATROPIUM-ALBUTEROL 0.5-2.5 (3) MG/3ML IN SOLN
3.0000 mL | Freq: Once | RESPIRATORY_TRACT | Status: AC
  Administered 2021-12-31: 3 mL via RESPIRATORY_TRACT
  Filled 2021-12-31: qty 3

## 2021-12-31 MED ORDER — CLOPIDOGREL BISULFATE 75 MG PO TABS
75.0000 mg | ORAL_TABLET | Freq: Every day | ORAL | Status: DC
  Administered 2021-12-31 – 2022-01-01 (×2): 75 mg via ORAL
  Filled 2021-12-31 (×2): qty 1

## 2021-12-31 MED ORDER — SODIUM CHLORIDE 0.9 % IV SOLN: INTRAVENOUS | Status: DC

## 2021-12-31 MED ORDER — PANTOPRAZOLE SODIUM 40 MG PO TBEC
40.0000 mg | DELAYED_RELEASE_TABLET | Freq: Every day | ORAL | Status: DC
  Administered 2021-12-31 – 2022-01-01 (×2): 40 mg via ORAL
  Filled 2021-12-31 (×2): qty 1

## 2021-12-31 MED ORDER — LOSARTAN POTASSIUM 50 MG PO TABS
100.0000 mg | ORAL_TABLET | Freq: Every morning | ORAL | Status: DC
  Administered 2022-01-01: 100 mg via ORAL
  Filled 2021-12-31: qty 2

## 2021-12-31 MED ORDER — HEPARIN SODIUM (PORCINE) 5000 UNIT/ML IJ SOLN
5000.0000 [IU] | Freq: Three times a day (TID) | INTRAMUSCULAR | Status: DC
  Administered 2021-12-31 – 2022-01-01 (×2): 5000 [IU] via SUBCUTANEOUS
  Filled 2021-12-31 (×2): qty 1

## 2021-12-31 MED ORDER — SODIUM CHLORIDE 0.9 % IV SOLN: Freq: Once | INTRAVENOUS | Status: AC

## 2021-12-31 MED ORDER — ACETAMINOPHEN 500 MG PO TABS
1000.0000 mg | ORAL_TABLET | Freq: Once | ORAL | Status: AC
  Administered 2021-12-31: 1000 mg via ORAL
  Filled 2021-12-31: qty 2

## 2021-12-31 MED ORDER — ACETAMINOPHEN 650 MG RE SUPP: 650.0000 mg | Freq: Four times a day (QID) | RECTAL | Status: DC | PRN

## 2021-12-31 MED ORDER — ROSUVASTATIN CALCIUM 5 MG PO TABS
5.0000 mg | ORAL_TABLET | Freq: Every day | ORAL | Status: DC
  Administered 2021-12-31 – 2022-01-01 (×2): 5 mg via ORAL
  Filled 2021-12-31 (×2): qty 1

## 2021-12-31 MED ORDER — UMECLIDINIUM BROMIDE 62.5 MCG/ACT IN AEPB
1.0000 | INHALATION_SPRAY | Freq: Every day | RESPIRATORY_TRACT | Status: DC
  Filled 2021-12-31: qty 7

## 2021-12-31 MED ORDER — IPRATROPIUM-ALBUTEROL 0.5-2.5 (3) MG/3ML IN SOLN
3.0000 mL | Freq: Four times a day (QID) | RESPIRATORY_TRACT | Status: DC
  Administered 2021-12-31: 3 mL via RESPIRATORY_TRACT
  Filled 2021-12-31: qty 3

## 2021-12-31 NOTE — ED Notes (Signed)
Patient removed from oxygen at this time. SpO2 maintaining 90-91% while patient lying in bed. EDP aware.

## 2021-12-31 NOTE — ED Notes (Signed)
Pt's sats 87% on RA while attempting to wean O2. Placed back on [facility]

## 2021-12-31 NOTE — Progress Notes (Signed)
This is a 76 yo M, resident of [facility].  She has past medical history of COPD, gastroesophageal reflux, longstanding history of tobacco abuse.  The past 2 days he has had cough, shortness of breath and fevers.  He was sent to [facility] ER.  Initially was afebrile but then was a fever Tmax 101.2.  Patient came back positive for influenza.  Patient hypoxic to mid 80s on room air.  Satting in the high 90s on 2 L oxygen.  Per EDP, patient without much wheezing, however, treated with Decadron and nebulizers.  Patient failed to be weaned off oxygen.  Admission requested.  Tamiflu given

## 2021-12-31 NOTE — H&P (Incomplete)
History and Physical    Kevin Brooks VQM:[id number] DOB: January 15, 1945 DOA: 12/30/2021  PCP: Derinda Late, MD  Patient coming from: [facility]  I have personally briefly reviewed patient's old medical records in [place]  Chief Complaint: cough /sob/fever x 2 days   HPI: Kevin Brooks is a 76 y.o. male with medical history significant of  COPD, gastroesophageal reflux, longstanding history of tobacco abuse  Depression, TIA,HLD who presents to [facility] from [employer]  with complaint of cough Kevin Brooks [employer] x 2 days. Patient notes he feels improved. States wheezing has resolved, but still notes intermittent cough with pleuritic type chest discomfort associated with coughing.  He notes no diarrhea/ abdominal pain or dysuria.   ED Course:  Tmx 101.2,  BP 149-188/79-114,  hr 122-95, 98% on 2L, rr 28 Cxr:NAD CE 10.14 EKG: sinus tachycardia , LAFB WBC 9.2, hgb 15.4, plt 127 NA 130( 136) , glu 112, cr 0.71 Resp panel , + flu DT:OIZTI rbc ,rare bacteria  Tx decadrone 10 mg iv m,douneb,tylenol,lactic 0.7,tamiflu  Review of Systems: As per HPI otherwise 10 point review of systems negative.   Past Medical History:  Diagnosis Date   BPH (benign prostatic hyperplasia)    Cataracta    COPD (chronic obstructive pulmonary disease) ([place])    Depression    ED (erectile dysfunction)    GERD (gastroesophageal reflux disease)    Hypertension    Hypogonadism male    Lung cancer ([place]) 11/13/2020   Mixed hyperlipidemia    Palpitations    TIA (transient ischemic attack)    Vision abnormalities     Past Surgical History:  Procedure Laterality Date    catarcts surgery     BRONCHIAL BIOPSY  11/13/2020   Procedure: BRONCHIAL BIOPSIES;  Surgeon: Garner Nash, DO;  Location: [facility];  Service: Pulmonary;;   BRONCHIAL BRUSHINGS  11/13/2020   Procedure: BRONCHIAL BRUSHINGS;  Surgeon: Garner Nash, DO;  Location: [facility];  Service: Pulmonary;;   BRONCHIAL NEEDLE ASPIRATION  BIOPSY  11/13/2020   Procedure: BRONCHIAL NEEDLE ASPIRATION BIOPSIES;  Surgeon: Garner Nash, DO;  Location: [facility];  Service: Pulmonary;;   COLONOSCOPY     COLONOSCOPY WITH PROPOFOL N/A 03/16/2020   Procedure: COLONOSCOPY WITH PROPOFOL;  Surgeon: Carol Ada, MD;  Location: [facility];  Service: Endoscopy;  Laterality: N/A;   ENTEROSCOPY N/A 03/16/2020   Procedure: ENTEROSCOPY;  Surgeon: Carol Ada, MD;  Location: [facility];  Service: Endoscopy;  Laterality: N/A;   HEMOSTASIS CLIP PLACEMENT  03/16/2020   Procedure: HEMOSTASIS CLIP PLACEMENT;  Surgeon: Carol Ada, MD;  Location: [facility];  Service: Endoscopy;;   HOT HEMOSTASIS N/A 03/16/2020   Procedure: HOT HEMOSTASIS (ARGON PLASMA COAGULATION/BICAP);  Surgeon: Carol Ada, MD;  Location: [facility];  Service: Endoscopy;  Laterality: N/A;   POLYPECTOMY  03/16/2020   Procedure: POLYPECTOMY;  Surgeon: Carol Ada, MD;  Location: [facility];  Service: Endoscopy;;   VIDEO BRONCHOSCOPY WITH ENDOBRONCHIAL NAVIGATION Right 11/13/2020   Procedure: VIDEO BRONCHOSCOPY WITH ENDOBRONCHIAL NAVIGATION;  Surgeon: Garner Nash, DO;  Location: [facility];  Service: Pulmonary;  Laterality: Right;  ION w/ fiducial placement   VIDEO BRONCHOSCOPY WITH RADIAL ENDOBRONCHIAL ULTRASOUND  11/13/2020   Procedure: RADIAL ENDOBRONCHIAL ULTRASOUND;  Surgeon: Garner Nash, DO;  Location: [facility];  Service: Pulmonary;;     reports that he has been smoking cigarettes. He has been smoking an average of 1 pack per day. He has never used smokeless tobacco. He reports current  alcohol use of about 1.0 standard drink of alcohol per week. He reports that he does not use drugs.  No Known Allergies  Family History  Problem Relation Age of Onset   Stroke Father     Prior to Admission medications   Medication Sig Start Date End Date Taking? Authorizing Provider  albuterol (VENTOLIN HFA) 108 (90 Base) MCG/ACT inhaler Inhale 2 puffs into the  lungs every 6 (six) hours as needed for wheezing or shortness of breath.   Yes [provider]  cholecalciferol (VITAMIN D3) 25 MCG (1000 UNIT) tablet Take 1,000 Units by mouth daily.   Yes [provider]  citalopram (CELEXA) 40 MG tablet Take 40 mg by mouth daily. 08/18/11  Yes [provider]  clopidogrel (PLAVIX) 75 MG tablet Take 75 mg by mouth daily. 11/15/16  Yes [provider]  Fluticasone-Umeclidin-Vilant (TRELEGY ELLIPTA) 100-62.5-25 MCG/INH AEPB Inhale 1 puff into the lungs daily. 10/22/20  Yes Icard, Bradley L, DO  losartan (COZAAR) 100 MG tablet Take 100 mg by mouth every morning. 02/12/19  Yes [provider]  omeprazole (PRILOSEC) 20 MG capsule Take 20 mg by mouth daily. 03/12/19  Yes [provider]  rosuvastatin (CRESTOR) 5 MG tablet Take 5 mg by mouth daily.   Yes [provider]  tadalafil (CIALIS) 5 MG tablet Take 5 mg by mouth at bedtime.   Yes [provider]  tamsulosin (FLOMAX) 0.4 MG CAPS capsule Take 0.4 mg by mouth at bedtime. 01/01/19  Yes [provider]  COVID-19 mRNA vaccine [phone] (COMIRNATY) syringe Inject into the muscle. 10/30/21     influenza vaccine adjuvanted (FLUAD QUADRIVALENT) 0.5 ML injection Inject into the muscle. 10/14/21   Carlyle Basques, MD  nystatin (MYCOSTATIN) 100000 UNIT/ML suspension Take 5 mLs (500,000 Units total) by mouth 4 (four) times daily. Patient not taking: Reported on 12/31/2021 11/06/21   June Leap L, DO  RSV vaccine recomb adjuvanted (AREXVY) 120 MCG/0.5ML injection Inject into the muscle. 10/15/21   Carlyle Basques, MD    Physical Exam: Vitals:   12/31/21 1500 12/31/21 1544 12/31/21 1545 12/31/21 1700  BP: (!) 148/92  (!) 157/85   Pulse: 77  71   Resp: 19  (!) 21   Temp:  97.7 F (36.5 C)    TempSrc:  Oral    SpO2: 95%  97%   Weight:    61 kg  Height:    5\' 7"  (1.702 m)    Constitutional: NAD, calm, comfortable Vitals:   12/31/21 1500  12/31/21 1544 12/31/21 1545 12/31/21 1700  BP: (!) 148/92  (!) 157/85   Pulse: 77  71   Resp: 19  (!) 21   Temp:  97.7 F (36.5 C)    TempSrc:  Oral    SpO2: 95%  97%   Weight:    61 kg  Height:    5\' 7"  (1.702 m)   Eyes: PERRL, lids and conjunctivae normal ENMT: Mucous membranes are moist. Posterior pharynx clear of any exudate or lesions.Normal dentition.  Neck: normal, supple, no masses, no thyromegaly Respiratory: clear to auscultation bilaterally but diminished, no wheezing, no crackles. Normal respiratory effort. No accessory muscle use.  Cardiovascular: Regular rate and rhythm, no murmurs / rubs / gallops. No extremity edema. 2+ pedal pulses.  Abdomen: no tenderness, no masses palpated. No hepatosplenomegaly. Bowel sounds positive.  Musculoskeletal: no clubbing / cyanosis. No joint deformity upper and lower extremities. Good ROM, no contractures. Normal muscle tone.  Skin: no rashes, lesions, ulcers.  No induration Neurologic: CN 2-12 grossly intact. Sensation intact, Strength 5/5 in all 4.  Psychiatric: Normal judgment and insight. Alert and oriented x 3. Normal mood.    Labs on Admission: I have personally reviewed following labs and imaging studies  CBC: Recent Labs  Lab 12/30/21 2352  WBC 9.2  NEUTROABS 7.7  HGB 15.4  HCT 42.5  MCV 94.2  PLT 379*   Basic Metabolic Panel: Recent Labs  Lab 12/30/21 2352  NA 130*  K 3.5  CL 95*  CO2 25  GLUCOSE 112*  BUN 13  CREATININE 0.71  CALCIUM 9.1   GFR: Estimated Creatinine Clearance: 69.9 mL/min (by C-G formula based on SCr of 0.71 mg/dL). Liver Function Tests: No results for input(s): "AST", "ALT", "ALKPHOS", "BILITOT", "PROT", "ALBUMIN" in the last 168 hours. No results for input(s): "LIPASE", "AMYLASE" in the last 168 hours. No results for input(s): "AMMONIA" in the last 168 hours. Coagulation Profile: No results for input(s): "INR", "PROTIME" in the last 168 hours. Cardiac Enzymes: No results for input(s):  "CKTOTAL", "CKMB", "CKMBINDEX", "TROPONINI" in the last 168 hours. BNP (last 3 results) No results for input(s): "PROBNP" in the last 8760 hours. HbA1C: No results for input(s): "HGBA1C" in the last 72 hours. CBG: No results for input(s): "GLUCAP" in the last 168 hours. Lipid Profile: No results for input(s): "CHOL", "HDL", "LDLCALC", "TRIG", "CHOLHDL", "LDLDIRECT" in the last 72 hours. Thyroid Function Tests: No results for input(s): "TSH", "T4TOTAL", "FREET4", "T3FREE", "THYROIDAB" in the last 72 hours. Anemia Panel: No results for input(s): "VITAMINB12", "FOLATE", "FERRITIN", "TIBC", "IRON", "RETICCTPCT" in the last 72 hours. Urine analysis:    Component Value Date/Time   COLORURINE COLORLESS (A) 12/31/2021 0015   APPEARANCEUR CLEAR 12/31/2021 0015   LABSPEC 1.009 12/31/2021 0015   PHURINE 7.0 12/31/2021 0015   GLUCOSEU NEGATIVE 12/31/2021 0015   HGBUR LARGE (A) 12/31/2021 0015   BILIRUBINUR NEGATIVE 12/31/2021 0015   KETONESUR NEGATIVE 12/31/2021 0015   PROTEINUR TRACE (A) 12/31/2021 0015   NITRITE NEGATIVE 12/31/2021 0015   LEUKOCYTESUR NEGATIVE 12/31/2021 0015    Radiological Exams on Admission: DG Chest Portable 1 View  Result Date: 12/30/2021 CLINICAL DATA:  cough; hypoxia EXAM: PORTABLE CHEST 1 VIEW COMPARISON:  Chest x-ray 11/13/2020, PET CT 09/26/2020 FINDINGS: The heart and mediastinal contours are unchanged. Aortic calcification. No focal consolidation. Chronic coarsened interstitial markings with no overt pulmonary edema. No pleural effusion. No pneumothorax. No acute osseous abnormality. IMPRESSION: 1. No active disease. 2. Aortic Atherosclerosis (ICD10-I70.0) and Emphysema (ICD10-J43.9). Electronically Signed   By: Iven Finn M.D.   On: 12/30/2021 23:58    EKG: Independently reviewed. See above   Assessment/Plan   Acute hypoxic respiratory failure due to COPD exacerbation caused by Influenza A infection  -admit to tele  -continue with supplemental O2  wean as able  -standing and prn nebs  -resume home controller medications  -continue tamiflu -prednisone 40 mg po daily x 5 days   -pulmonary toilet /is/flutter    Abnormal UA  -will send for culture   GERD -ppi   Tobacco abuse  -encourage cessation   Depression -resume home regimen   TIA -continue on secondary ppx   HLD - continue on statin   DVT prophylaxis: lovenox  Code Status: Full : discussed with patient who notes that he is full Code  Family Communication: none at bedside  Disposition Plan: patient  expected to be admitted greater than 2 midnights  Consults called: n/a Admission status: med tele   Sara-Maiz A  Marcello Moores MD [facility]  If 7PM-7AM, please contact night-coverage [email or web address] Password [email or web address]  12/31/2021, 7:04 PM

## 2022-01-01 DIAGNOSIS — J9601 Acute respiratory failure with hypoxia: Secondary | ICD-10-CM | POA: Diagnosis not present

## 2022-01-01 LAB — EXPECTORATED SPUTUM ASSESSMENT W GRAM STAIN, RFLX TO RESP C

## 2022-01-01 LAB — COMPREHENSIVE METABOLIC PANEL
ALT: 19 U/L (ref 0–44)
AST: 25 U/L (ref 15–41)
Albumin: 3.3 g/dL — ABNORMAL LOW (ref 3.5–5.0)
Alkaline Phosphatase: 39 U/L (ref 38–126)
Anion gap: 9 (ref 5–15)
BUN: 13 mg/dL (ref 8–23)
CO2: 26 mmol/L (ref 22–32)
Calcium: 8.4 mg/dL — ABNORMAL LOW (ref 8.9–10.3)
Chloride: 100 mmol/L (ref 98–111)
Creatinine, Ser: 0.69 mg/dL (ref 0.61–1.24)
GFR, Estimated: >60 mL/min (ref >60)
Glucose, Bld: 112 mg/dL — ABNORMAL HIGH (ref 70–99)
Potassium: 3.3 mmol/L — ABNORMAL LOW (ref 3.5–5.1)
Sodium: 135 mmol/L (ref 135–145)
Total Bilirubin: 0.9 mg/dL (ref 0.3–1.2)
Total Protein: 6.5 g/dL (ref 6.5–8.1)

## 2022-01-01 LAB — CBC
HCT: 42.7 % (ref 39.0–52.0)
Hemoglobin: 15.2 g/dL (ref 13.0–17.0)
MCH: 33.9 pg (ref 26.0–34.0)
MCHC: 35.6 g/dL (ref 30.0–36.0)
MCV: 95.3 fL (ref 80.0–100.0)
Platelets: 139 10*3/uL — ABNORMAL LOW (ref 150–400)
RBC: 4.48 MIL/uL (ref 4.22–5.81)
RDW: 11.9 % (ref 11.5–15.5)
WBC: 6.3 10*3/uL (ref 4.0–10.5)
nRBC: 0 % (ref 0.0–0.2)

## 2022-01-01 MED ORDER — PREDNISONE 20 MG PO TABS: 20.0000 mg | ORAL_TABLET | Freq: Every day | ORAL | 0 refills | Status: AC

## 2022-01-01 MED ORDER — POTASSIUM CHLORIDE CRYS ER 20 MEQ PO TBCR: 20.0000 meq | EXTENDED_RELEASE_TABLET | Freq: Two times a day (BID) | ORAL | 0 refills | Status: DC

## 2022-01-01 MED ORDER — OSELTAMIVIR PHOSPHATE 75 MG PO CAPS: 75.0000 mg | ORAL_CAPSULE | Freq: Two times a day (BID) | ORAL | 0 refills | Status: AC

## 2022-01-01 MED ORDER — POTASSIUM CHLORIDE CRYS ER 20 MEQ PO TBCR
20.0000 meq | EXTENDED_RELEASE_TABLET | Freq: Two times a day (BID) | ORAL | Status: DC
  Administered 2022-01-01: 20 meq via ORAL
  Filled 2022-01-01: qty 1

## 2022-01-01 NOTE — Progress Notes (Signed)
Patient was given discharge instructions, and all questions were answered.  Patient was stable for discharge and was taken to the main exit by wheelchair. 

## 2022-01-01 NOTE — Care Management Obs Status (Signed)
[place] NOTIFICATION   Patient Details  Name: Kevin Brooks MRN: [id number] Date of Birth: [birth year]   Medicare Observation Status Notification Given:  Yes    Lennart Pall, LCSW 01/01/2022, 10:59 AM

## 2022-01-01 NOTE — Discharge Summary (Signed)
Physician Discharge Summary  [facility] WTU:[id number] DOB: 08-Feb-1945 DOA: 12/30/2021  PCP: Derinda Late, MD  Admit date: 12/30/2021 Discharge date: 01/01/2022  Admitted From: Independent  Disposition: Independent living  Recommendations for Outpatient Follow-up:  Follow up with PCP in 1-2 weeks Please obtain BMP/CBC in one week   Home Health: N/A Equipment/Devices: N/A  Discharge Condition: Stable CODE STATUS: Full code Diet recommendation: Low-salt diet  Discharge summary:  76 year old man with history of COPD, GERD, previous history of smoking, TIA and hyperlipidemia presented to the emergency room with ongoing cough and shortness of breath for 2 days.  In the emergency room hemodynamically stable.  Initially 88% on room air.  Respiratory virus panel positive for influenza A.  He was monitored in the [facility] due to COPD exacerbation secondary to influenza A infection and hypoxemia.  Treated with Tamiflu, oral steroids, breathing treatment.  Clinically improved.  Currently on room air and mobilizing around. Plan: Complete 5 days of Tamiflu therapy.  Over-the-counter cough medications and Tylenol.  Received 2 doses of prednisone, prednisone 20 mg daily for 3 more days. He will continue to do chest physiotherapy and exercises at home. Potassium was 3.3, will prescribe short course of potassium supplementation.  Chronic medical diseases stable.  No change in therapy.   Discharge Diagnoses:  Principal Problem:   Acute respiratory failure with hypoxia [facility])    Discharge Instructions  Discharge Instructions     Call MD for:  difficulty breathing, headache or visual disturbances   Complete by: As directed    Diet - low sodium heart healthy   Complete by: As directed    Discharge instructions   Complete by: As directed    Can take tylenol , cough syrups   Increase activity slowly   Complete by: As directed       Allergies as of 01/01/2022   No Known  Allergies      Medication List     STOP taking these medications    Arexvy 120 MCG/0.5ML injection Generic drug: RSV vaccine recomb adjuvanted   Comirnaty syringe Generic drug: COVID-19 mRNA vaccine 2023-2024   Fluad Quadrivalent 0.5 ML injection Generic drug: influenza vaccine adjuvanted   nystatin 100000 UNIT/ML suspension Commonly known as: MYCOSTATIN       TAKE these medications    albuterol 108 (90 Base) MCG/ACT inhaler Commonly known as: VENTOLIN HFA Inhale 2 puffs into the lungs every 6 (six) hours as needed for wheezing or shortness of breath.   cholecalciferol 25 MCG (1000 UNIT) tablet Commonly known as: VITAMIN D3 Take 1,000 Units by mouth daily.   citalopram 40 MG tablet Commonly known as: CELEXA Take 40 mg by mouth daily.   clopidogrel 75 MG tablet Commonly known as: PLAVIX Take 75 mg by mouth daily.   losartan 100 MG tablet Commonly known as: COZAAR Take 100 mg by mouth every morning.   omeprazole 20 MG capsule Commonly known as: PRILOSEC Take 20 mg by mouth daily.   oseltamivir 75 MG capsule Commonly known as: TAMIFLU Take 1 capsule (75 mg total) by mouth 2 (two) times daily for 4 days.   potassium chloride SA 20 MEQ tablet Commonly known as: KLOR-CON M Take 1 tablet (20 mEq total) by mouth 2 (two) times daily for 3 days.   predniSONE 20 MG tablet Commonly known as: DELTASONE Take 1 tablet (20 mg total) by mouth daily with breakfast for 4 days. Start taking on: January 02, 2022   rosuvastatin 5 MG tablet Commonly known  as: CRESTOR Take 5 mg by mouth daily.   tadalafil 5 MG tablet Commonly known as: CIALIS Take 5 mg by mouth at bedtime.   tamsulosin 0.4 MG Caps capsule Commonly known as: FLOMAX Take 0.4 mg by mouth at bedtime.   Trelegy Ellipta 100-62.5-25 MCG/ACT Aepb Generic drug: Fluticasone-Umeclidin-Vilant Inhale 1 puff into the lungs daily.        No Known  Allergies  Consultations: None   Procedures/Studies: DG Chest Portable 1 View  Result Date: 12/30/2021 CLINICAL DATA:  cough; hypoxia EXAM: PORTABLE CHEST 1 VIEW COMPARISON:  Chest x-ray 11/13/2020, PET CT 09/26/2020 FINDINGS: The heart and mediastinal contours are unchanged. Aortic calcification. No focal consolidation. Chronic coarsened interstitial markings with no overt pulmonary edema. No pleural effusion. No pneumothorax. No acute osseous abnormality. IMPRESSION: 1. No active disease. 2. Aortic Atherosclerosis (ICD10-I70.0) and Emphysema (ICD10-J43.9). Electronically Signed   By: Iven Finn M.D.   On: 12/30/2021 23:58   (Echo, Carotid, EGD, Colonoscopy, ERCP)    Subjective: Patient seen and examined.  Denies any complaints.  Has some dry cough otherwise feels better.  He stayed in the emergency room for about 24 hours before coming to the floor and his symptoms are already improved.  Eager to go home.   Discharge Exam: Vitals:   01/01/22 0502 01/01/22 0745  BP: 139/75   Pulse: 71   Resp: 16   Temp: 98 F (36.7 C)   SpO2: 94% 97%   Vitals:   01/01/22 0129 01/01/22 0500 01/01/22 0502 01/01/22 0745  BP: (!) 140/69  139/75   Pulse: 69  71   Resp: 14  16   Temp: 97.8 F (36.6 C)  98 F (36.7 C)   TempSrc:      SpO2: 94%  94% 97%  Weight:  63 kg    Height:        General: Pt is alert, awake, not in acute distress Cardiovascular: RRR, S1/S2 +, no rubs, no gallops Respiratory: CTA bilaterally, no wheezing, no rhonchi, no added sounds. Abdominal: Soft, NT, ND, bowel sounds + Extremities: no edema, no cyanosis    The results of significant diagnostics from this hospitalization (including imaging, microbiology, ancillary and laboratory) are listed below for reference.     Microbiology: Recent Results (from the past 240 hour(s))  Resp panel by RT-PCR (RSV, Flu A&B, Covid) Anterior Nasal Swab     Status: Abnormal   Collection Time: 12/30/21 11:52 PM   Specimen:  Anterior Nasal Swab  Result Value Ref Range Status   SARS Coronavirus 2 by RT PCR NEGATIVE NEGATIVE Final    Comment: (NOTE) SARS-CoV-2 target nucleic acids are NOT DETECTED.  The SARS-CoV-2 RNA is generally detectable in upper respiratory specimens during the acute phase of infection. The lowest concentration of SARS-CoV-2 viral copies this assay can detect is 138 copies/mL. A negative result does not preclude SARS-Cov-2 infection and should not be used as the sole basis for treatment or other patient management decisions. A negative result may occur with  improper specimen collection/handling, submission of specimen other than nasopharyngeal swab, presence of viral mutation(s) within the areas targeted by this assay, and inadequate number of viral copies(<138 copies/mL). A negative result must be combined with clinical observations, patient history, and epidemiological information. The expected result is Negative.  Fact Sheet for Patients:  EntrepreneurPulse.com.au  Fact Sheet for Healthcare Providers:  IncredibleEmployment.be  This test is no t yet approved or cleared by the [place] and  has been authorized for  detection and/or diagnosis of SARS-CoV-2 by FDA under an Emergency Use Authorization (EUA). This EUA will remain  in effect (meaning this test can be used) for the duration of the COVID-19 declaration under Section 564(b)(1) of the Act, 21 U.S.C.section 360bbb-3(b)(1), unless the authorization is terminated  or revoked sooner.       Influenza A by PCR POSITIVE (A) NEGATIVE Final   Influenza B by PCR NEGATIVE NEGATIVE Final    Comment: (NOTE) The Xpert Xpress SARS-CoV-2/FLU/RSV plus assay is intended as an aid in the diagnosis of influenza from Nasopharyngeal swab specimens and should not be used as a sole basis for treatment. Nasal washings and aspirates are unacceptable for Xpert Xpress  SARS-CoV-2/FLU/RSV testing.  Fact Sheet for Patients: EntrepreneurPulse.com.au  Fact Sheet for Healthcare Providers: IncredibleEmployment.be  This test is not yet approved or cleared by the [place] FDA and has been authorized for detection and/or diagnosis of SARS-CoV-2 by FDA under an Emergency Use Authorization (EUA). This EUA will remain in effect (meaning this test can be used) for the duration of the COVID-19 declaration under Section 564(b)(1) of the Act, 21 U.S.C. section 360bbb-3(b)(1), unless the authorization is terminated or revoked.     Resp Syncytial Virus by PCR NEGATIVE NEGATIVE Final    Comment: (NOTE) Fact Sheet for Patients: EntrepreneurPulse.com.au  Fact Sheet for Healthcare Providers: IncredibleEmployment.be  This test is not yet approved or cleared by the [place] FDA and has been authorized for detection and/or diagnosis of SARS-CoV-2 by FDA under an Emergency Use Authorization (EUA). This EUA will remain in effect (meaning this test can be used) for the duration of the COVID-19 declaration under Section 564(b)(1) of the Act, 21 U.S.C. section 360bbb-3(b)(1), unless the authorization is terminated or revoked.  Performed at [facility], [address], [place], [place] [address]   Expectorated Sputum Assessment w Gram Stain, Rflx to Resp Cult     Status: None   Collection Time: 01/01/22  3:50 AM   Specimen: Expectorated Sputum  Result Value Ref Range Status   Specimen Description EXPECTORATED SPUTUM  Final   Special Requests NONE  Final   Sputum evaluation   Final    THIS SPECIMEN IS ACCEPTABLE FOR SPUTUM CULTURE Performed at [facility], [place] [address] [place], [place] [address]    Report Status 01/01/2022 FINAL  Final     Labs: BNP (last 3 results) No results for input(s): "BNP" in the last 8760 hours. Basic Metabolic  Panel: Recent Labs  Lab 12/30/21 2352 12/31/21 2118 01/01/22 0553  NA 130*  --  135  K 3.5  --  3.3*  CL 95*  --  100  CO2 25  --  26  GLUCOSE 112*  --  112*  BUN 13  --  13  CREATININE 0.71 0.67 0.69  CALCIUM 9.1  --  8.4*   Liver Function Tests: Recent Labs  Lab 01/01/22 0553  AST 25  ALT 19  ALKPHOS 39  BILITOT 0.9  PROT 6.5  ALBUMIN 3.3*   No results for input(s): "LIPASE", "AMYLASE" in the last 168 hours. No results for input(s): "AMMONIA" in the last 168 hours. CBC: Recent Labs  Lab 12/30/21 2352 12/31/21 2118 01/01/22 0553  WBC 9.2 8.3 6.3  NEUTROABS 7.7  --   --   HGB 15.4 16.2 15.2  HCT 42.5 45.5 42.7  MCV 94.2 95.0 95.3  PLT 127* 159 139*   Cardiac Enzymes: No results for input(s): "CKTOTAL", "CKMB", "CKMBINDEX", "TROPONINI"  in the last 168 hours. BNP: Invalid input(s): "POCBNP" CBG: No results for input(s): "GLUCAP" in the last 168 hours. D-Dimer No results for input(s): "DDIMER" in the last 72 hours. Hgb A1c No results for input(s): "HGBA1C" in the last 72 hours. Lipid Profile No results for input(s): "CHOL", "HDL", "LDLCALC", "TRIG", "CHOLHDL", "LDLDIRECT" in the last 72 hours. Thyroid function studies Recent Labs    12/31/21 2118  TSH 4.247   Anemia work up No results for input(s): "VITAMINB12", "FOLATE", "FERRITIN", "TIBC", "IRON", "RETICCTPCT" in the last 72 hours. Urinalysis    Component Value Date/Time   COLORURINE COLORLESS (A) 12/31/2021 0015   APPEARANCEUR CLEAR 12/31/2021 0015   LABSPEC 1.009 12/31/2021 0015   PHURINE 7.0 12/31/2021 0015   GLUCOSEU NEGATIVE 12/31/2021 0015   HGBUR LARGE (A) 12/31/2021 0015   BILIRUBINUR NEGATIVE 12/31/2021 0015   KETONESUR NEGATIVE 12/31/2021 0015   PROTEINUR TRACE (A) 12/31/2021 0015   NITRITE NEGATIVE 12/31/2021 0015   LEUKOCYTESUR NEGATIVE 12/31/2021 0015   Sepsis Labs Recent Labs  Lab 12/30/21 2352 12/31/21 2118 01/01/22 0553  WBC 9.2 8.3 6.3   Microbiology Recent Results  (from the past 240 hour(s))  Resp panel by RT-PCR (RSV, Flu A&B, Covid) Anterior Nasal Swab     Status: Abnormal   Collection Time: 12/30/21 11:52 PM   Specimen: Anterior Nasal Swab  Result Value Ref Range Status   SARS Coronavirus 2 by RT PCR NEGATIVE NEGATIVE Final    Comment: (NOTE) SARS-CoV-2 target nucleic acids are NOT DETECTED.  The SARS-CoV-2 RNA is generally detectable in upper respiratory specimens during the acute phase of infection. The lowest concentration of SARS-CoV-2 viral copies this assay can detect is 138 copies/mL. A negative result does not preclude SARS-Cov-2 infection and should not be used as the sole basis for treatment or other patient management decisions. A negative result may occur with  improper specimen collection/handling, submission of specimen other than nasopharyngeal swab, presence of viral mutation(s) within the areas targeted by this assay, and inadequate number of viral copies(<138 copies/mL). A negative result must be combined with clinical observations, patient history, and epidemiological information. The expected result is Negative.  Fact Sheet for Patients:  EntrepreneurPulse.com.au  Fact Sheet for Healthcare Providers:  IncredibleEmployment.be  This test is no t yet approved or cleared by the [place] FDA and  has been authorized for detection and/or diagnosis of SARS-CoV-2 by FDA under an Emergency Use Authorization (EUA). This EUA will remain  in effect (meaning this test can be used) for the duration of the COVID-19 declaration under Section 564(b)(1) of the Act, 21 U.S.C.section 360bbb-3(b)(1), unless the authorization is terminated  or revoked sooner.       Influenza A by PCR POSITIVE (A) NEGATIVE Final   Influenza B by PCR NEGATIVE NEGATIVE Final    Comment: (NOTE) The Xpert Xpress SARS-CoV-2/FLU/RSV plus assay is intended as an aid in the diagnosis of influenza from Nasopharyngeal  swab specimens and should not be used as a sole basis for treatment. Nasal washings and aspirates are unacceptable for Xpert Xpress SARS-CoV-2/FLU/RSV testing.  Fact Sheet for Patients: EntrepreneurPulse.com.au  Fact Sheet for Healthcare Providers: IncredibleEmployment.be  This test is not yet approved or cleared by the [place] FDA and has been authorized for detection and/or diagnosis of SARS-CoV-2 by FDA under an Emergency Use Authorization (EUA). This EUA will remain in effect (meaning this test can be used) for the duration of the COVID-19 declaration under Section 564(b)(1) of the Act, 21 U.S.C. section 360bbb-3(b)(1),  unless the authorization is terminated or revoked.     Resp Syncytial Virus by PCR NEGATIVE NEGATIVE Final    Comment: (NOTE) Fact Sheet for Patients: EntrepreneurPulse.com.au  Fact Sheet for Healthcare Providers: IncredibleEmployment.be  This test is not yet approved or cleared by the [place] FDA and has been authorized for detection and/or diagnosis of SARS-CoV-2 by FDA under an Emergency Use Authorization (EUA). This EUA will remain in effect (meaning this test can be used) for the duration of the COVID-19 declaration under Section 564(b)(1) of the Act, 21 U.S.C. section 360bbb-3(b)(1), unless the authorization is terminated or revoked.  Performed at [facility], [address], [place], [place] [address]   Expectorated Sputum Assessment w Gram Stain, Rflx to Resp Cult     Status: None   Collection Time: 01/01/22  3:50 AM   Specimen: Expectorated Sputum  Result Value Ref Range Status   Specimen Description EXPECTORATED SPUTUM  Final   Special Requests NONE  Final   Sputum evaluation   Final    THIS SPECIMEN IS ACCEPTABLE FOR SPUTUM CULTURE Performed at [facility], [place] [address] [place], [place] [address]    Report Status  01/01/2022 FINAL  Final     Time coordinating discharge:  35 minutes  SIGNED:   Barb Merino, MD  [facility] 01/01/2022, 8:52 AM

## 2022-01-01 NOTE — Care Management CC44 (Signed)
Condition Code 44 Documentation Completed  Patient Details  Name: Kevin [facility] MRN: [id number] Date of Birth: August 10, 1945   Condition Code 44 given:  Yes Patient signature on Condition Code 44 notice:   (verbal agreement) Documentation of 2 MD's agreement:  Yes Code 44 added to claim:  Yes    Zorian Gunderman, LCSW 01/01/2022, 10:59 AM

## 2022-01-01 NOTE — Plan of Care (Signed)
  Problem: Coping: Goal: Level of anxiety will decrease Outcome: Progressing   Problem: Activity: Goal: Risk for activity intolerance will decrease Outcome: Progressing   

## 2022-01-02 LAB — CULTURE, RESPIRATORY W GRAM STAIN

## 2022-01-02 LAB — HEMOGLOBIN A1C
Hgb A1c MFr Bld: 5.7 % — ABNORMAL HIGH (ref 4.8–5.6)
Mean Plasma Glucose: 117 mg/dL

## 2022-01-02 LAB — URINE CULTURE: Culture: NO GROWTH

## 2022-01-08 ENCOUNTER — Other Ambulatory Visit: Payer: Self-pay | Admitting: Family Medicine

## 2022-01-08 ENCOUNTER — Ambulatory Visit
Admission: RE | Admit: 2022-01-08 | Discharge: 2022-01-08 | Disposition: A | Discharge status: Home / Self Care | Payer: Medicare Other | Source: Ambulatory Visit | Attending: Family Medicine | Admitting: Family Medicine

## 2022-01-08 DIAGNOSIS — J101 Influenza due to other identified influenza virus with other respiratory manifestations: Secondary | ICD-10-CM

## 2022-01-08 DIAGNOSIS — R0602 Shortness of breath: Secondary | ICD-10-CM

## 2022-05-07 ENCOUNTER — Ambulatory Visit (HOSPITAL_COMMUNITY)
Admission: RE | Admit: 2022-05-07 | Discharge: 2022-05-07 | Disposition: A | Discharge status: Home / Self Care | Payer: Medicare Other | Source: Ambulatory Visit | Attending: Family Medicine | Admitting: Family Medicine

## 2022-05-07 DIAGNOSIS — R918 Other nonspecific abnormal finding of lung field: Secondary | ICD-10-CM | POA: Diagnosis not present

## 2022-05-07 DIAGNOSIS — J439 Emphysema, unspecified: Secondary | ICD-10-CM | POA: Diagnosis not present

## 2022-05-07 DIAGNOSIS — J432 Centrilobular emphysema: Secondary | ICD-10-CM | POA: Insufficient documentation

## 2022-05-07 DIAGNOSIS — C3491 Malignant neoplasm of unspecified part of right bronchus or lung: Secondary | ICD-10-CM | POA: Insufficient documentation

## 2022-05-07 DIAGNOSIS — C3490 Malignant neoplasm of unspecified part of unspecified bronchus or lung: Secondary | ICD-10-CM | POA: Insufficient documentation

## 2022-05-07 DIAGNOSIS — I700 Atherosclerosis of aorta: Secondary | ICD-10-CM | POA: Insufficient documentation

## 2022-05-09 ENCOUNTER — Other Ambulatory Visit: Payer: Self-pay | Admitting: Acute Care

## 2022-05-09 ENCOUNTER — Ambulatory Visit: Payer: Medicare Other | Admitting: Acute Care

## 2022-05-09 ENCOUNTER — Encounter: Payer: Self-pay | Admitting: Acute Care

## 2022-05-09 VITALS — BP 130/68 | HR 78 | Temp 97.8°F | Ht 67.0 in | Wt 140.0 lb

## 2022-05-09 DIAGNOSIS — R911 Solitary pulmonary nodule: Secondary | ICD-10-CM

## 2022-05-09 DIAGNOSIS — F17200 Nicotine dependence, unspecified, uncomplicated: Secondary | ICD-10-CM

## 2022-05-09 DIAGNOSIS — R918 Other nonspecific abnormal finding of lung field: Secondary | ICD-10-CM

## 2022-05-09 DIAGNOSIS — J449 Chronic obstructive pulmonary disease, unspecified: Secondary | ICD-10-CM | POA: Diagnosis not present

## 2022-05-09 DIAGNOSIS — Z85118 Personal history of other malignant neoplasm of bronchus and lung: Secondary | ICD-10-CM

## 2022-05-09 NOTE — Patient Instructions (Addendum)
It is good to see you today Your surveillance CT Chest has several new lung nodules .  I have ordered a PET scan to better evaluate these nodules.  Follow up with Maralyn Sago NP or Dr. Tonia Brooms after PET is complete.  We will determine if Biopsy is needed after reviewing the results. Please work on quitting smoking. You can get free nicotine patches , gum or mints by calling 1-800-QUIT NOW Continue Trelegy 1 puff once daily Rinse mouth after use Continue Albuterol as needed for breakthrough shortness of breath or wheezing up to 2 puffs every 6 hours. If you find you are needing your albuterol more frequently, please call to be seen.  Note your daily symptoms > remember "red flags" for COPD:  Increase in cough, increase in sputum production, increase in shortness of breath or activity intolerance. If you notice these symptoms, please call to be seen.

## 2022-05-09 NOTE — Progress Notes (Signed)
History of Present Illness Kevin Brooks is a 77 y.o. 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  77 yo M, medical history of COPD, emphysema, gastroesophageal reflux, longstanding history of tobacco abuse.  He has smoked since he was 77 years old.  He has been enrolled in lung cancer screening program.  He had a lung cancer screening CT that  revealed a right lower lobe subsolid lesion.  Patient underwent PET scan for this which revealed low-level metabolic uptake concerning for indolent neoplasm.  Additionally has a lesion found within his spleen that was hypermetabolic with SUV of 7.  Patient  is being followed  regarding lung nodule and consideration for biopsy.  He does have COPD currently managed with  Trelegy. Pt. Has a history of right lower lobe adenocarcinoma status post SBRT now with radiation changes in the lower lobe no evidence of recurrence. Others multiple small pulmonary nodules. Has ongoing centrilobular emphysema.    Diagnoses: C34.31-Malignant neoplasm of lower lobe, right bronchus or lung  Cancer Staging: Stage IA2, cT1bN0M0, NSCLC, adenocarcinoma of the RLL  Intent: Curative  Radiation Treatment Dates: 12/26/2020 through 01/02/2021   05/09/2022 Pt. Presents for follow up of surveillance CT Chest , he is being followed for right lower lobe subsolid lesion.Scan done 05/07/2022 is concerning for new nodules , specifically  a 14 by 10 mm focus in the left upper lobe and focus in right upper lobe focus measuring 14 x 9 mm. We discussed that this is concerning, and that the next step is for a PET scan to better evaluate these findings. He is continuing to smoke. He is currently smoking  15 cigarettes daily. His COPD is well controlled with his Trelegy and albuterol as rescue.  He did  have the flu in December that required hospitalization. He had weight loss during that hospitalization but has gained the weight back. He denies any hemoptysis.  He does have what appears to be mild oral thrush. He has nystatin mouthwash to use as needed. He states his mouth does not hurt and is not bothering him at all.   Test Results: Chest Imaging: 09/26/2020: Nuclear medicine pet imaging reveals a subsolid pulmonary nodule within the right right lower lobe at 1.4 cm low-level SUV concerning for malignancy. Also splenic lesion which is hypermetabolic concerning for lymphoma versus metastasis.     January 2023 CT chest: Multiple pulmonary nodules, new in the left upper lobe.  Evidence of severe centrilobular emphysema.     July 2023 CT chest: Radiation changes of the right lower lobe, enlarging right lung nodules including a 6 mm inferior nodule of the right upper lobe new from previous imaging.  Other scattered small pulmonary nodules.      October 2023 CT chest: Radiation changes to the right lower lobe.  Scattered nodules within the chest changing in size suggesting inflammatory lesion.  However he remains high risk for malignancy due to his prior history. Has evidence of centrilobular emphysema.    April 2024 CT Chest Multiple areas of lung nodularity once again identified. Several areas previously are very similar to the previous examination overall. This includes the confluence area just above the right hemidiaphragm.   However there are several new lesion seen bilaterally including for example a 14 by 10 mm focus in the left upper lobe and focus in right upper  lobe focus measuring 14 x 9 mm. Other new lesions as well. With the change, areas of progression of neoplasm are possible and recommend further evaluation such as PET-CT scan. No developing pneumothorax or effusion. Evidence of old granulomatous disease. Images personally reviewed by me.      Pulmonary  Functions Testing Results:     Latest Ref Rng & Units 09/13/2020   11:03 AM  PFT Results  FVC-Pre L 2.85   FVC-Predicted Pre % 71   FVC-Post L 3.35   FVC-Predicted Post % 84   Pre FEV1/FVC % % 50   Post FEV1/FCV % % 48   FEV1-Pre L 1.42   FEV1-Predicted Pre % 49   FEV1-Post L 1.60   DLCO uncorrected ml/min/mmHg 14.46   DLCO UNC% % 60   DLCO corrected ml/min/mmHg 14.46   DLCO COR %Predicted % 60   DLVA Predicted % 57   TLC L 8.20   TLC % Predicted % 123   RV % Predicted % 191       FeNO:      Latest Ref Rng & Units 01/01/2022    5:53 AM 12/31/2021    9:18 PM 12/30/2021   11:52 PM  CBC  WBC 4.0 - 10.5 K/uL 6.3  8.3  9.2   Hemoglobin 13.0 - 17.0 g/dL 09.8  11.9  14.7   Hematocrit 39.0 - 52.0 % 42.7  45.5  42.5   Platelets 150 - 400 K/uL 139  159  127        Latest Ref Rng & Units 01/01/2022    5:53 AM 12/31/2021    9:18 PM 12/30/2021   11:52 PM  BMP  Glucose 70 - 99 mg/dL 829   562   BUN 8 - 23 mg/dL 13   13   Creatinine 1.30 - 1.24 mg/dL 8.65  7.84  6.96   Sodium 135 - 145 mmol/L 135   130   Potassium 3.5 - 5.1 mmol/L 3.3   3.5   Chloride 98 - 111 mmol/L 100   95   CO2 22 - 32 mmol/L 26   25   Calcium 8.9 - 10.3 mg/dL 8.4   9.1     BNP No results found for: "BNP"  ProBNP No results found for: "PROBNP"  PFT    Component Value Date/Time   FEV1PRE 1.42 09/13/2020 1103   FEV1POST 1.60 09/13/2020 1103   FVCPRE 2.85 09/13/2020 1103   FVCPOST 3.35 09/13/2020 1103   TLC 8.20 09/13/2020 1103   DLCOUNC 14.46 09/13/2020 1103   PREFEV1FVCRT 50 09/13/2020 1103   PSTFEV1FVCRT 48 09/13/2020 1103    CT Chest Wo Contrast  Result Date: 05/09/2022 CLINICAL DATA:  Restaging lung adenocarcinoma. Completed treatments. * Tracking Code: BO * EXAM: CT CHEST WITHOUT CONTRAST TECHNIQUE: Multidetector CT imaging of the chest was performed following the standard protocol without IV contrast. RADIATION DOSE REDUCTION: This exam was performed according to the departmental  dose-optimization program which includes automated exposure control, adjustment of the mA and/or kV according to patient size and/or use of iterative reconstruction technique. COMPARISON:  CT 10/31/2021 and older.  X-ray 01/08/2022 FINDINGS: Cardiovascular: Tiny pericardial effusion. The heart is nonenlarged. Coronary artery calcifications are seen. On this non IV contrast exam the thoracic aorta has a normal course and caliber with mild atherosclerotic calcified plaque. Mediastinum/Nodes: Atrophic thyroid gland. Small hiatal hernia. On this non IV contrast exam there is no specific abnormal lymph node enlargement present in the axillary regions, hilum or mediastinum.  There are some calcified lymph nodes seen in the right lung hilum, central mediastinum consistent with old granulomatous disease. Lungs/Pleura: No pneumothorax or effusion. Once again there are centrilobular emphysematous lung changes seen diffusely lungs greatest in the upper lung zones. Apical pleural thickening identified bilaterally, right-greater-than-left with some calcification. There are several lung nodules identified. Some of these were seen previously there are some that are new. Specific lesions will be documented below. Once again there is heterogeneous opacity in the right lower lobe just above the right hemidiaphragm. This is somewhat ill-defined nodular area on the prior was measured in diameter at 5.4 x 4.3 cm and today when measured in a similar fashion for continuity measures 5.4 by 3.8 cm. As stated previously this could represent an area of posttreatment change. There is some adjacent spiculated scarring and bandlike areas, fibrosis. The small spiculated nodular area in the anterior right upper lobe this is stable today on series 5, image 50 also measures 5-6 mm. Posterior to this with a previous nodule measuring 9 x 4 mm and today on series 5, image 27 this measures smaller at 6 by 4 mm. Just posterior and superior to this is a  small lung nodule which is new on series 5, image 116 measuring 5 mm. Superior to this is a small nodule which has developed from the prior as well on series 5, image 93 also measuring 6 mm. There is new nodule in the posterior right upper lobe inferiorly on series 5, image 68 measuring 5 mm. At the right lung apex is new larger area of nodularity anteriorly on series 5, image 31 measuring 14 by 9 mm. Additional new small nodule along the posterior right lung apex on series 5, image 31. Once again there are several lesions in the left lung. Previous lesion left lung apex which measured 9 x 7 mm, today measures 8 x 7 mm on series 5, image 21. More posteriorly more caudal this has a second lesion which measured 14 x 7 mm which today is essentially unchanged on series 5, image 30 measuring 13 x 6 mm. The previous nodule in the anterior left upper lobe more caudal the measured 5 mm on the prior is unchanged today on series 5, image 45. However there are some new lesions identified as well. This includes a nodule left lung apex series 5, image 24 measuring 9 by 6 mm. Left upper lobe focus laterally on series 5, image 34 measuring 14 by 10 mm. Upper Abdomen: Along the upper abdomen the adrenal glands are preserved. The stomach is collapsed. Question slight fold thickening. There is a metallic focus seen within the body of the stomach. Please correlate for any known history. Musculoskeletal: Slight pectus excavatum. Curvature of the spine. Mild degenerative changes. IMPRESSION: Multiple areas of lung nodularity once again identified. Several areas previously are very similar to the previous examination overall. This includes the confluence area just above the right hemidiaphragm. However there are several new lesion seen bilaterally including for example a 14 by 10 mm focus in the left upper lobe and focus in right upper lobe focus measuring 14 x 9 mm. Other new lesions as well. With the change, areas of progression of  neoplasm are possible and recommend further evaluation such as PET-CT scan. No developing pneumothorax or effusion. Evidence of old granulomatous disease. Aortic Atherosclerosis (ICD10-I70.0) and Emphysema (ICD10-J43.9). Electronically Signed   By: Karen Kays M.D.   On: 05/09/2022 11:49     Past medical hx Past  Medical History:  Diagnosis Date   BPH (benign prostatic hyperplasia)    Cataracta    COPD (chronic obstructive pulmonary disease) (HCC)    Depression    ED (erectile dysfunction)    GERD (gastroesophageal reflux disease)    Hypertension    Hypogonadism male    Lung cancer (HCC) 11/13/2020   Mixed hyperlipidemia    Palpitations    TIA (transient ischemic attack)    Vision abnormalities      Social History   Tobacco Use   Smoking status: Every Day    Packs/day: 1    Types: Cigarettes   Smokeless tobacco: Never   Tobacco comments:    15 cigarettes smoked daily ARJ 05/09/22  Vaping Use   Vaping Use: Every day  Substance Use Topics   Alcohol use: Yes    Alcohol/week: 1.0 standard drink of alcohol    Types: 1 Cans of beer per week    Comment: beer daily   Drug use: No    Mr.Summerhill reports that he has been smoking cigarettes. He has been smoking an average of 1 pack per day. He has never used smokeless tobacco. He reports current alcohol use of about 1.0 standard drink of alcohol per week. He reports that he does not use drugs.  Tobacco Cessation: Current every day smoker, smokes 15 cigarettes a day , with a 25+ pack year smoking history   Past surgical hx, Family hx, Social hx all reviewed.  Current Outpatient Medications on File Prior to Visit  Medication Sig   albuterol (VENTOLIN HFA) 108 (90 Base) MCG/ACT inhaler Inhale 2 puffs into the lungs every 6 (six) hours as needed for wheezing or shortness of breath.   cholecalciferol (VITAMIN D3) 25 MCG (1000 UNIT) tablet Take 1,000 Units by mouth daily.   citalopram (CELEXA) 40 MG tablet Take 40 mg by mouth daily.    clopidogrel (PLAVIX) 75 MG tablet Take 75 mg by mouth daily.   Fluticasone-Umeclidin-Vilant (TRELEGY ELLIPTA) 100-62.5-25 MCG/INH AEPB Inhale 1 puff into the lungs daily.   losartan (COZAAR) 100 MG tablet Take 100 mg by mouth every morning.   omeprazole (PRILOSEC) 20 MG capsule Take 20 mg by mouth daily.   rosuvastatin (CRESTOR) 5 MG tablet Take 5 mg by mouth daily.   tadalafil (CIALIS) 5 MG tablet Take 5 mg by mouth at bedtime.   tamsulosin (FLOMAX) 0.4 MG CAPS capsule Take 0.4 mg by mouth at bedtime.   potassium chloride SA (KLOR-CON M) 20 MEQ tablet Take 1 tablet (20 mEq total) by mouth 2 (two) times daily for 3 days.   No current facility-administered medications on file prior to visit.     No Known Allergies  Review Of Systems:  Constitutional:   No  weight loss, night sweats,  Fevers, chills, fatigue, or  lassitude.  HEENT:   No headaches,  Difficulty swallowing,  Tooth/dental problems, or  Sore throat,                No sneezing, itching, ear ache, nasal congestion, post nasal drip,   CV:  No chest pain,  Orthopnea, PND, swelling in lower extremities, anasarca, dizziness, palpitations, syncope.   GI  No heartburn, indigestion, abdominal pain, nausea, vomiting, diarrhea, change in bowel habits, loss of appetite, bloody stools.   Resp: No shortness of breath with exertion or at rest.  No excess mucus, no productive cough,  No non-productive cough,  No coughing up of blood.  No change in color of mucus.  No  wheezing.  No chest wall deformity  Skin: no rash or lesions.  GU: no dysuria, change in color of urine, no urgency or frequency.  No flank pain, no hematuria   MS:  No joint pain or swelling.  No decreased range of motion.  No back pain.  Psych:  No change in mood or affect. No depression or anxiety.  No memory loss.   Vital Signs BP 130/68 (BP Location: Left Arm, Patient Position: Sitting, Cuff Size: Normal)   Pulse 78   Temp 97.8 F (36.6 C) (Oral)   Ht 5\' 7"   (1.702 m)   Wt 140 lb (63.5 kg)   SpO2 96%   BMI 21.93 kg/m    Physical Exam:  General- No distress,  A&Ox3 ENT: No sinus tenderness, TM clear, pale nasal mucosa, no oral exudate,no post nasal drip, no LAN. + oral thrush Cardiac: S1, S2, regular rate and rhythm, no murmur Chest: No wheeze/ rales/ dullness; no accessory muscle use, no nasal flaring, no sternal retractions Abd.: Soft Non-tender, ND, BS +, Body mass index is 21.93 kg/m.  Ext: No clubbing cyanosis, edema Neuro:  normal strength, MAE x 4, A&O x 3 Skin: No rashes, warm and dry, no lesions  Psych: normal mood and behavior   Assessment/Plan History of Adenocarcinoma of the lung SBRT therapy 12/2020. Surveillance CT Chest with some new nodules of concern. Plan Your surveillance CT Chest has several new lung nodules .  I have ordered a PET scan to better evaluate these nodules.  Follow up with Maralyn Sago NP or Dr. Tonia Brooms after PET is complete.  We will determine if Biopsy is needed after reviewing the results.  COPD Current every day smoker  Plan Continue Trelegy 1 puff once daily Rinse mouth after use Continue Albuterol as needed for breakthrough shortness of breath or wheezing up to 2 puffs every 6 hours. If you find you are needing your albuterol more frequently, please call to be seen.  Please work on quitting smoking. You can get free nicotine patches , gum or mints by calling 1-800-QUIT NOW Note your daily symptoms > remember "red flags" for COPD:  Increase in cough, increase in sputum production, increase in shortness of breath or activity intolerance. If you notice these symptoms, please call to be seen.    I spent 35 minutes dedicated to the care of this patient on the date of this encounter to include pre-visit review of records, face-to-face time with the patient discussing conditions above, post visit ordering of testing, clinical documentation with the electronic health record, making appropriate referrals as  documented, and communicating necessary information to the patient's healthcare team.   Bevelyn Ngo, NP 05/09/2022  1:11 PM

## 2022-05-12 ENCOUNTER — Telehealth: Payer: Self-pay | Admitting: Radiation Oncology

## 2022-05-12 ENCOUNTER — Encounter: Payer: Self-pay | Admitting: Radiation Oncology

## 2022-05-12 ENCOUNTER — Other Ambulatory Visit: Payer: Self-pay | Admitting: Radiation Oncology

## 2022-05-12 ENCOUNTER — Ambulatory Visit
Admission: RE | Admit: 2022-05-12 | Discharge: 2022-05-12 | Disposition: A | Discharge status: Home / Self Care | Payer: Medicare Other | Source: Ambulatory Visit | Attending: Radiation Oncology | Admitting: Radiation Oncology

## 2022-05-12 ENCOUNTER — Ambulatory Visit: Payer: Medicare Other | Admitting: Radiation Oncology

## 2022-05-12 DIAGNOSIS — C3431 Malignant neoplasm of lower lobe, right bronchus or lung: Secondary | ICD-10-CM | POA: Insufficient documentation

## 2022-05-12 DIAGNOSIS — R058 Other specified cough: Secondary | ICD-10-CM

## 2022-05-12 DIAGNOSIS — R911 Solitary pulmonary nodule: Secondary | ICD-10-CM

## 2022-05-12 LAB — HIV ANTIBODY (ROUTINE TESTING W REFLEX): HIV Screen 4th Generation wRfx: NONREACTIVE

## 2022-05-12 NOTE — Telephone Encounter (Signed)
I called the patient and left a message for him to let him know we had seen his CT scan results from last week and agree with the plan set in place with [name], NP in pulmonary medicine. He has multiple new nodules in the lungs and his PET is scheduled for 05/27/22. We will add him to conference discussion on 05/29/22 that Dr. Mitzi Hansen and I will already be present for. He does not need the formality of a follow up visit today so I've cancelled this and encouraged him to call back if he desires for further discussion.

## 2022-05-12 NOTE — Progress Notes (Signed)
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  He has been followed up in surveillance and is seen frequently as well by pulmonary medicine given his history of COPD emphysema.  He has had multiple images since treatment with waxing and waning of lung nodules.  I have communicated with Dr. Lenise Herald on several occasions because of these findings.  He was seen and sent for a CT scan on 05/07/2022, in addition to stability in the area of the right lower lobe nodule treated with radiation, he has developed multiple nodules throughout the lungs, the largest appear in the upper lobes including the left lung apex measuring 9 mm, posterior in the left upper lobe measuring 14 mm, and several stable nodules were also seen in the left lung.  In the right lung, there appears to be a new 5 mm, 6 mm, and 5 mm nodules in addition to 14 mm.  No evidence of adenopathy in the chest was appreciated.  He also continues to have atherosclerotic and emphysematous disease.  Given these findings he has a PET scan that has been scheduled for 05/27/2022.  Earlier in the day I tried calling to cancel his appointment since this was redundant from his appointment with pulmonary medicine last week however he came in clinic today.  We discussed the findings and looked through his scans.  He continues to have a cough that is somewhat productive with white to yellow mucus.  He denies any fevers or chills but admits that he had flu in December 2023.  He does not have any recent COPD exacerbations.  He denies any new infections since December.  Again we discussed risks of atypical infection and while he has never had any immunocompromise state, I have asked on several occasions about HIV testing.  He is open to this again and we will proceed with testing today.  He does not feel he is at risk of  environmental exposures or known exposures to tuberculosis.  We will present his imaging at multidisciplinary thoracic oncology conference which happens to be the same week as his PET scan.  He is in agreement also in the meantime of trying to start Flonase nasal spray and an over-the-counter antihistamine to see if some of his mucus is related to seasonal allergies.

## 2022-05-13 ENCOUNTER — Telehealth: Payer: Self-pay | Admitting: Radiation Oncology

## 2022-05-13 NOTE — Telephone Encounter (Signed)
I called to let the patient know HIV testing was negative. We will plan to follow up as outlined yesterday after his PET and MTOC discussion.

## 2022-05-15 ENCOUNTER — Other Ambulatory Visit: Payer: Self-pay

## 2022-05-15 NOTE — Progress Notes (Signed)
The proposed treatment discussed in conference is for discussion purpose only and is not a binding recommendation.  The patients have not been physically examined, or presented with their treatment options.  Therefore, final treatment plans cannot be decided.  

## 2022-05-17 NOTE — Progress Notes (Signed)
Nuclear medicine pet imaging is pending.  Can we make sure patient has an appointment to see [place] after the PET scan? -- or ---   Maralyn Sago do you plan on calling him?  I guess if the PET scan is positive I am fine with setting him up for bronchoscopy.  Thanks,  BLI  [name], DO [facility] 05/17/2022 5:34 PM

## 2022-05-27 ENCOUNTER — Ambulatory Visit (HOSPITAL_COMMUNITY)
Admission: RE | Admit: 2022-05-27 | Discharge: 2022-05-27 | Disposition: A | Discharge status: Home / Self Care | Payer: Medicare Other | Source: Ambulatory Visit | Attending: Acute Care | Admitting: Acute Care

## 2022-05-27 DIAGNOSIS — R911 Solitary pulmonary nodule: Secondary | ICD-10-CM | POA: Diagnosis present

## 2022-05-27 LAB — GLUCOSE, CAPILLARY: Glucose-Capillary: 94 mg/dL (ref 70–99)

## 2022-05-27 MED ORDER — FLUDEOXYGLUCOSE F - 18 (FDG) INJECTION
7.0000 | Freq: Once | INTRAVENOUS | Status: AC
  Administered 2022-05-27: 6.91 via INTRAVENOUS

## 2022-05-29 ENCOUNTER — Telehealth: Payer: Self-pay | Admitting: Radiation Oncology

## 2022-05-29 DIAGNOSIS — C3431 Malignant neoplasm of lower lobe, right bronchus or lung: Secondary | ICD-10-CM

## 2022-05-29 NOTE — Telephone Encounter (Signed)
I called the patient and reviewed his PET scan and recommendations after discussion with radiology this am (though not discussed in conference due to time). Dr. [name] feels these hypermetabolic changes in the lung are likely inflammatory/atypical infection rather than having features concerning for malignancy. He recommends pulmonary guidance for management and repeat imaging at shorter interval. I left a VM for the patient letting him know I'd order another scan in 3-4 months and copy Dr. Tonia Brooms and [name] as well.

## 2022-06-11 ENCOUNTER — Telehealth: Payer: Self-pay | Admitting: *Deleted

## 2022-06-11 ENCOUNTER — Telehealth: Payer: Self-pay | Admitting: Acute Care

## 2022-06-11 NOTE — Telephone Encounter (Signed)
Kevin Brooks- please advise. °

## 2022-06-11 NOTE — Telephone Encounter (Signed)
PT had appt with his PCP today and the PCP wanted to speak to Ms. Kevin Brooks about this PT. PCP is [facility]. Dr's # is [phone] (This afternoon or Friday afternoon good to call. He works quite late.)   After the PET scan he got a call from [employer] from radiology and when she read the scan it looked positive in the way of cancer but an infection shows. It's been two weeks and he has not heard back about possibly [place] ordering an antibx or antifungal RX.  PCP felt there was so urgency to this. Pls also call PT to advise if script will be sent.   [facility] on [address] and [address]

## 2022-06-11 NOTE — Telephone Encounter (Signed)
I have called Dr Duaine Dredge at his request.  I have also called Kevin Brooks and explained the below to him also.    I explained that Dr. Tonia Brooks and Kevin Brooks have discussed the plan for Kevin Brooks based on his  PET scan results. There was a concern for an atypical infection based on PET scan results.  Dr. Tonia Brooks felt because the patient is not currently symptomatic (confirmed by the patient to Kevin Brooks today, and by Dr. Duaine Dredge), that plan will be for repeat CT in 3 months and if CT shows changes plan will be to move forward with a bronch for cultures and possibly for biopsy.  If patient becomes symptomatic I have asked him to call us so that we can repeat imaging at that time and plan to bronc for cultures and possible biopsy.  I also offered Kevin Brooks the option of coming to the office for sputum culture cups and we could send cultures now to be evaluated.  He does not feel that is necessary at this time, however I have told him if he changes his mind just to let Kevin Brooks know and I would be happy to place the orders.  Plan for now is as discussed below in the chain of telephone messages between Dr. Tonia Brooks and Kevin Aly, [name].  Follow-up imaging in August 2024, unless patient becomes symptomatic prior to that date and he will get repeat imaging at that time and bronchoscopy at that time.  Kevin Brooks understands the above and is comfortable with the plan.  However he does understand he can call and let us know if he wants culture sooner. I told him if cultures were positive for any bacteria we could refer to infectious disease for possible treatment.  I also explained that when bronchoscopy is done , if that is the plan for the future based on follow-up imaging, we could also do cultures at that time.  I also will investigate why patient was not scheduled for a follow up appointment post PET to review the results of the PET scan. That is our usual practice.I appreciate Kevin Brooks sharing the results with  the patient.     Ok lets plan on a repeat image in 3 months Then if it changes then we will move forward with a bronch  Kevin Brooks, Kevin Dolly, [name]  to Kevin Igo, DO  Kevin Brooks     06/03/22  9:06 AM  He did tell Kevin Brooks he had more of a productive cough than previously but no fevers or chills or other constitutional symptoms. Kevin Igo, DO  to Kevin Bacon, [name]  Kevin Brooks     06/03/22  8:58 AM  I havent seen him. So if he is assymptomatic I would repeat images in 3 months. If he symptomatic then I would bronch him or if he becomes symptomatic, reimage then bronch  Kevin Brooks Jun 02, 2022 Kevin Bacon, [name]  to Kevin Igo, DO     06/02/22  8:10 AM  Yes, repeating imaging is my plan. Whatever you think is best if we need to figure out what the source of these findings are? Jun 01, 2022 Kevin Igo, DO  to Kevin Bacon, [name]  Kevin Brooks  Kevin Butte, RN  Kevin Brooks     06/01/22  7:38 PM  Sounds good.  So the plan is repeat images?  Or should we bronch him again now?  Kevin Brooks May 29, 2022 Kevin Brooks, [name]  R  to Kevin Caprio, RN  Kevin Brooks   05/29/22 12:44 PM Just Lorain Childes Kevin Bacon, [name]  to Kevin Igo, DO  Kevin Brooks  Kevin Butte, RN  Kevin Brooks, Kevin Brooks      05/29/22  8:54 AM FYI. I called and left a message for the pt. Do you recommend antibiotics or sputum cultures?  Kevin Brooks and Kevin Brooks- we can take him off conference list next week.  Thanks, Kevin Brooks, Kevin Dolly, [name]      05/29/22  8:52 AM Note I called the patient and reviewed his PET scan and recommendations after discussion with radiology this am (though not discussed in conference due to time). Dr. Deirdre Brooks feels these hypermetabolic changes in the lung are likely inflammatory/atypical infection rather than having features concerning for malignancy. He recommends pulmonary guidance for management and repeat imaging at shorter interval. I left a VM for  the patient letting him know I'd order another scan in 3-4 months and copy Dr. Tonia Brooks and Kevin Brooks as well.

## 2022-08-05 ENCOUNTER — Ambulatory Visit (INDEPENDENT_AMBULATORY_CARE_PROVIDER_SITE_OTHER): Payer: Medicare Other

## 2022-08-05 ENCOUNTER — Encounter: Payer: Self-pay | Admitting: Internal Medicine

## 2022-08-05 ENCOUNTER — Ambulatory Visit: Payer: Medicare Other | Admitting: Internal Medicine

## 2022-08-05 VITALS — BP 128/86 | HR 75 | Ht 67.0 in | Wt 139.6 lb

## 2022-08-05 DIAGNOSIS — Z720 Tobacco use: Secondary | ICD-10-CM | POA: Insufficient documentation

## 2022-08-05 DIAGNOSIS — R918 Other nonspecific abnormal finding of lung field: Secondary | ICD-10-CM | POA: Diagnosis not present

## 2022-08-05 DIAGNOSIS — R911 Solitary pulmonary nodule: Secondary | ICD-10-CM

## 2022-08-05 NOTE — Patient Instructions (Addendum)
Order- sputum C&S   routine, fungal, AFB    dx Lung nodules  Order- CXR   dx lung nodules, dyspnea on exertion  Keep appointments for CT and for follow-up with Dr Tonia Brooms in August

## 2022-08-05 NOTE — Assessment & Plan Note (Signed)
Smoker w hx ung Ca. Now with lung nodules looking inflammatory on PET. He ws getting anxious about waiting. Plan- sputum culture cups today, CXR. Keep appointment for CT and for f/u with Dr Tonia Brooms in 3 weeks, unless he needs help sooner

## 2022-08-05 NOTE — Progress Notes (Signed)
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  Medical history of COPD, emphysema, gastroesophageal reflux, longstanding history of tobacco abuse.  He has smoked since he was 77 years old.  He has been enrolled in lung cancer screening program.  He had a lung cancer screening CT that  revealed a right lower lobe subsolid lesion.  Patient underwent PET scan for this which revealed low-level metabolic uptake concerning for indolent neoplasm.  Additionally has a lesion found within his spleen that was hypermetabolic with SUV of 7.  Patient  is being followed  regarding lung nodule and consideration for biopsy.  He does have COPD currently managed with  Trelegy. Pt. Has a history of right lower lobe adenocarcinoma status post SBRT now with radiation changes in the lower lobe no evidence of recurrence. Others multiple small pulmonary nodules. Has ongoing centrilobular emphysema.  =========================================================== 08/05/22- 77 yoM Smoker (1ppd) followed by Dr Tonia Brooms with COPD, hx AdenoCa LOV 4/26/24[place] Lodge, NP-  April 2024 CT Chest Multiple areas of lung nodularity once again identified. Several areas previously are very similar to the previous examination overall. This includes the confluence area just above the right hemidiaphragm. However there are several new lesion seen bilaterally including for example a 14 by 10 mm focus in the left upper lobe and focus in right upper lobe focus measuring 14 x 9 mm. Other new lesions as well. With the change, areas of progression of neoplasm are possible and recommend further evaluation such as PET-CT scan. No developing pneumothorax or effusion. Evidence of old granulomatous disease. PET 05/27/22-  IMPRESSION: 1. Waxing and  waning upper lobe pulmonary nodules. At least 1 focal area of nodular consolidation in the posterior segment right upper lobe is new in the interval from 05/07/2022. Lesions are hypermetabolic and an infectious/inflammatory etiology such as atypical or fungal pneumonia is strongly favored. Follow-up CT chest without contrast could be performed in 3 months in further evaluation, as clinically indicated, as malignancy cannot be definitively excluded. 2. Hypermetabolic left parotid nodule. Malignancy cannot be excluded. 3. Post radiation scarring in the right lower lobe. 4.  Emphysema (ICD10-J43.9).   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  Plan as of last phone 5/29 was for f/u with Dr Tonia Brooms for updated imaging, bronchoscopy in August. Concern for possible atypical infection warranting sputum culture, earlier imaging, possible referral to ID.  Mr. Boomershine today notes that he may be a bit more short of breath, although it is not interfering with his activities.  Cough is productive of mostly clear sputum with some specks but no blood and nothing frankly purulent.  He thought that since PET suggested infection, maybe he should not wait until August to get started.  He denies night sweats, fever, adenopathy or bleeding. Arrival O2 sat 98%.  Prior to Admission medications   Medication Sig Start Date End Date Taking? Authorizing Provider  albuterol (VENTOLIN HFA) 108 (90 Base) MCG/ACT inhaler Inhale 2 puffs into the lungs every 6 (six) hours as needed for wheezing or shortness of breath.   Yes [provider]  cholecalciferol (VITAMIN D3) 25 MCG (1000 UNIT) tablet Take 1,000 Units by mouth daily.   Yes [provider]  citalopram (CELEXA) 40 MG tablet Take 40 mg by mouth daily. 08/18/11  Yes [provider]  clopidogrel (PLAVIX) 75 MG tablet Take 75 mg by mouth daily. 11/15/16  Yes [provider]   Fluticasone-Umeclidin-Vilant (TRELEGY ELLIPTA) 100-62.5-25 MCG/INH AEPB Inhale 1 puff into the lungs daily. 10/22/20  Yes Icard, Bradley L, DO  losartan (COZAAR) 100 MG tablet Take 100 mg by mouth every morning. 02/12/19  Yes [provider]  omeprazole (PRILOSEC) 20 MG capsule Take 20 mg by mouth daily. 03/12/19  Yes [provider]  rosuvastatin (CRESTOR) 5 MG tablet Take 5 mg by mouth daily.   Yes [provider]  tadalafil (CIALIS) 5 MG tablet Take 5 mg by mouth at bedtime.   Yes [provider]  tamsulosin (FLOMAX) 0.4 MG CAPS capsule Take 0.4 mg by mouth at bedtime. 01/01/19  Yes [provider]  potassium chloride SA (KLOR-CON M) 20 MEQ tablet Take 1 tablet (20 mEq total) by mouth 2 (two) times daily for 3 days. 01/01/22 01/04/22  Dorcas Carrow, MD   Past Medical History:  Diagnosis Date   BPH (benign prostatic hyperplasia)    Cataracta    COPD (chronic obstructive pulmonary disease) (HCC)    Depression    ED (erectile dysfunction)    GERD (gastroesophageal reflux disease)    Hypertension    Hypogonadism male    Lung cancer (HCC) 11/13/2020   Mixed hyperlipidemia    Palpitations    TIA (transient ischemic attack)    Vision abnormalities    Past Surgical History:  Procedure Laterality Date    catarcts surgery     BRONCHIAL BIOPSY  11/13/2020   Procedure: BRONCHIAL BIOPSIES;  Surgeon: [name], DO;  Location: [facility];  Service: Pulmonary;;   BRONCHIAL BRUSHINGS  11/13/2020   Procedure: BRONCHIAL BRUSHINGS;  Surgeon: [name], DO;  Location: [facility];  Service: Pulmonary;;   BRONCHIAL NEEDLE ASPIRATION BIOPSY  11/13/2020   Procedure: BRONCHIAL NEEDLE ASPIRATION BIOPSIES;  Surgeon: [name], DO;  Location: [facility];  Service: Pulmonary;;   COLONOSCOPY     COLONOSCOPY WITH PROPOFOL N/A 03/16/2020   Procedure: COLONOSCOPY WITH PROPOFOL;  Surgeon: Jeani Hawking, MD;  Location: [facility];  Service:  Endoscopy;  Laterality: N/A;   ENTEROSCOPY N/A 03/16/2020   Procedure: ENTEROSCOPY;  Surgeon: Jeani Hawking, MD;  Location: [facility];  Service: Endoscopy;  Laterality: N/A;   HEMOSTASIS CLIP PLACEMENT  03/16/2020   Procedure: HEMOSTASIS CLIP PLACEMENT;  Surgeon: Jeani Hawking, MD;  Location: [facility];  Service: Endoscopy;;   HOT HEMOSTASIS N/A 03/16/2020   Procedure: HOT HEMOSTASIS (ARGON PLASMA COAGULATION/BICAP);  Surgeon: Jeani Hawking, MD;  Location: [facility];  Service: Endoscopy;  Laterality: N/A;   POLYPECTOMY  03/16/2020   Procedure: POLYPECTOMY;  Surgeon: Jeani Hawking, MD;  Location: [facility];  Service: Endoscopy;;   VIDEO BRONCHOSCOPY WITH ENDOBRONCHIAL NAVIGATION Right 11/13/2020   Procedure: VIDEO BRONCHOSCOPY WITH ENDOBRONCHIAL NAVIGATION;  Surgeon: [name], DO;  Location: [facility];  Service: Pulmonary;  Laterality: Right;  ION w/ fiducial placement   VIDEO BRONCHOSCOPY WITH RADIAL ENDOBRONCHIAL ULTRASOUND  11/13/2020   Procedure: RADIAL ENDOBRONCHIAL ULTRASOUND;  Surgeon: [name], DO;  Location: [facility];  Service: Pulmonary;;   ROS-see HPI  + = positive Constitutional:    weight loss, night sweats, fevers, chills, fatigue, lassitude. HEENT:    headaches, difficulty swallowing, tooth/dental problems, sore throat,       sneezing, itching, ear ache, nasal congestion, post nasal drip, snoring CV:    chest pain, orthopnea, PND, swelling in lower extremities,  anasarca,                       dizziness, palpitations Resp:   +shortness of breath with exertion or at rest.                +productive cough,   non-productive cough, coughing up of blood.              change in color of mucus.  wheezing.   Skin:    rash or lesions. GI:  No-   heartburn, indigestion, abdominal pain, nausea, vomiting, diarrhea,                 change in bowel habits, loss of appetite GU: dysuria, change in color of urine, no urgency or frequency.   flank pain. MS:   joint pain,  stiffness, decreased range of motion, back pain. Neuro-     nothing unusual Psych:  change in mood or affect.  depression or anxiety.   memory loss.  OBJ- Physical Exam General- Alert, Oriented, Affect-appropriate, Distress- none acute Skin- rash-none, lesions- none, excoriation- none Lymphadenopathy- none Head- atraumatic            Eyes- Gross vision intact, PERRLA, conjunctivae and secretions clear            Ears- Hearing, canals-normal            Nose- Clear, no-Septal dev, mucus, polyps, erosion, perforation             Throat- Mallampati II , mucosa clear , drainage- none, tonsils- atrophic Neck- + L neck mass submandibular Chest - symmetrical excursion , unlabored           Heart/CV- RRR , no murmur , no gallop  , no rub, nl s1 s2                           - JVD- none , edema- none, stasis changes- none, varices- none           Lung- clear to P&A, wheeze- none, cough- none , dullness-none, rub- none           Chest wall-  Abd-  Br/ Gen/ Rectal- Not done, not indicated Extrem- cyanosis- none, clubbing, none, atrophy- none, strength- nl Neuro- grossly intact to observation

## 2022-08-05 NOTE — Assessment & Plan Note (Signed)
Active smoker despite hx COPD, lung Ca. Strongly advise cessation now.

## 2022-08-06 ENCOUNTER — Other Ambulatory Visit: Payer: Medicare Other

## 2022-08-06 DIAGNOSIS — R911 Solitary pulmonary nodule: Secondary | ICD-10-CM

## 2022-08-26 ENCOUNTER — Telehealth: Payer: Self-pay | Admitting: Internal Medicine

## 2022-08-26 ENCOUNTER — Telehealth: Payer: Self-pay | Admitting: *Deleted

## 2022-08-26 NOTE — Telephone Encounter (Signed)
CALLED PATIENT TO INFORM OF TELEPHONE FU BEING MOVED OUT TO 09/08/22 DUE TO THE READING ROOM BEING BEHIND ON READING SCANS

## 2022-08-26 NOTE — Telephone Encounter (Signed)
Patient is returning phone call for labwork results. Patient phone number is [phone].

## 2022-08-27 ENCOUNTER — Other Ambulatory Visit: Payer: Self-pay

## 2022-08-27 DIAGNOSIS — A310 Pulmonary mycobacterial infection: Secondary | ICD-10-CM

## 2022-08-27 NOTE — Telephone Encounter (Signed)
Went over results with patient. Ref to infectious disease placed. Patient also scheduled for f/u with Dr. Tonia Brooms. NFN att

## 2022-08-29 ENCOUNTER — Ambulatory Visit (HOSPITAL_COMMUNITY)
Admission: RE | Admit: 2022-08-29 | Discharge: 2022-08-29 | Disposition: A | Discharge status: Home / Self Care | Payer: Medicare Other | Source: Ambulatory Visit | Attending: Radiation Oncology | Admitting: Radiation Oncology

## 2022-08-29 DIAGNOSIS — C3431 Malignant neoplasm of lower lobe, right bronchus or lung: Secondary | ICD-10-CM | POA: Diagnosis not present

## 2022-08-29 LAB — POCT I-STAT CREATININE: Creatinine, Ser: 0.9 mg/dL (ref 0.61–1.24)

## 2022-08-29 MED ORDER — IOHEXOL 300 MG/ML  SOLN
75.0000 mL | Freq: Once | INTRAMUSCULAR | Status: AC | PRN
  Administered 2022-08-29: 75 mL via INTRAVENOUS

## 2022-08-29 MED ORDER — SODIUM CHLORIDE (PF) 0.9 % IJ SOLN
INTRAMUSCULAR | Status: AC
  Filled 2022-08-29: qty 50

## 2022-09-01 ENCOUNTER — Ambulatory Visit: Payer: Medicare Other | Admitting: Radiation Oncology

## 2022-09-05 LAB — FUNGUS CULTURE W SMEAR

## 2022-09-08 ENCOUNTER — Encounter: Payer: Self-pay | Admitting: Radiation Oncology

## 2022-09-08 ENCOUNTER — Ambulatory Visit
Admission: RE | Admit: 2022-09-08 | Discharge: 2022-09-08 | Disposition: A | Discharge status: Home / Self Care | Payer: Medicare Other | Source: Ambulatory Visit | Attending: Radiation Oncology | Admitting: Radiation Oncology

## 2022-09-08 DIAGNOSIS — R918 Other nonspecific abnormal finding of lung field: Secondary | ICD-10-CM

## 2022-09-08 DIAGNOSIS — C3431 Malignant neoplasm of lower lobe, right bronchus or lung: Secondary | ICD-10-CM

## 2022-09-08 NOTE — Progress Notes (Addendum)
Telephone nursing appointment for patient to review most recent scan results. I verified patient's identity x2 and began nursing interview.   Patient reports moderate SOB, fatigue, but has a good appetite. No other issues conveyed at this time.   Meaningful use complete.   Patient aware of their 3:30pm-09/08/2022 telephone appointment w/ [name] [name]. I left my extension [phone] in case patient needs anything. Patient verbalized understanding. This concludes the nursing interview.  Patient contact [phone]     Ruel Favors, LPN

## 2022-09-09 ENCOUNTER — Other Ambulatory Visit: Payer: Self-pay | Admitting: Radiation Oncology

## 2022-09-09 DIAGNOSIS — R918 Other nonspecific abnormal finding of lung field: Secondary | ICD-10-CM

## 2022-09-09 DIAGNOSIS — C3431 Malignant neoplasm of lower lobe, right bronchus or lung: Secondary | ICD-10-CM
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
## 2022-09-10 ENCOUNTER — Other Ambulatory Visit: Payer: Self-pay

## 2022-09-10 ENCOUNTER — Other Ambulatory Visit (HOSPITAL_COMMUNITY): Payer: Self-pay

## 2022-09-10 ENCOUNTER — Encounter: Payer: Self-pay | Admitting: Internal Medicine

## 2022-09-10 ENCOUNTER — Ambulatory Visit: Payer: Medicare Other | Admitting: Internal Medicine

## 2022-09-10 VITALS — BP 131/79 | HR 103 | Temp 98.4°F | Ht 67.0 in | Wt 139.0 lb

## 2022-09-10 DIAGNOSIS — A310 Pulmonary mycobacterial infection: Secondary | ICD-10-CM | POA: Diagnosis not present

## 2022-09-10 MED ORDER — RIFABUTIN 150 MG PO CAPS: 300.0000 mg | ORAL_CAPSULE | ORAL | 5 refills | Status: DC

## 2022-09-10 MED ORDER — ETHAMBUTOL HCL 400 MG PO TABS: 1200.0000 mg | ORAL_TABLET | ORAL | 11 refills | Status: DC

## 2022-09-10 MED ORDER — AZITHROMYCIN 250 MG PO TABS: ORAL_TABLET | ORAL | 5 refills | Status: DC

## 2022-09-10 MED ORDER — ETHAMBUTOL HCL 400 MG PO TABS: 1600.0000 mg | ORAL_TABLET | ORAL | 11 refills | Status: DC

## 2022-09-10 NOTE — Progress Notes (Signed)
Counseled Kevin Brooks on his MAC regimen. Informed him that he would be taking azithromycin, ethambutol, and rifabutin three times weekly. Counseled him to take azithromycin and all four ethambutol tablets at once on Monday, Wednesday and Friday with dinner. Counseled him to take 2 rifabutin capsules with lunch on Monday, Wednesday and Friday. Informed him of common side effects such as nausea, headache, red-orange body secretions, and blurry vision. Provided him with detailed instructions on his AVS and with the pharmacy phone number if he has any questions. Gave him a pillbox to help organize medications. All questions answered.  Lennie Muckle, PharmD PGY1 Pharmacy Resident 09/10/2022 3:40 PM

## 2022-09-10 NOTE — Patient Instructions (Addendum)
Instructions on how to take your medications:  Please take rifabutin 300 mg (2 capsules) by mouth on Monday, Wednesday, and Friday with lunch. Please take azithromycin 250 mg (1 tablet) by mouth on Monday, Wednesday, and Friday with dinner. Please take ethambutol 1600 mg (4 tablets) by mouth on Monday, Wednesday, and Friday with dinner.   If you have any questions or concerns regarding your medications, please call the clinic at [phone] and ask to speak to a pharmacist.    NONTUBERCULOUS MYCOBACTERIAL LUNG DISEASE  You have been diagnosed with a lung infection caused by a nontuberculous mycobacterium (MAC).  What are nontuberculous mycobacteria? Nontuberculous mycobacteria (abbreviated NTM) are germs that are related to tuberculosis but that live in the environment, primarily in the soil and water. The difference between NTM and tuberculosis is that NTM are not spread by person to person contact and are not considered to be contagious.  NTM infection is acquired from the environment. We do not know how or why people become infected with NTM germs. Although we can easily recover the germ from soil, water, and air samples, most people do not become sick from this organism. Scientists and physicians who study these germs think that perhaps the people who become infected have some defect in the structure or function of their lungs or in their immune systems. People who have damaged lung tissue from previous tuberculosis, heavy smoking, and a breathing tube disease called bronchiectasis, are three recognized risk factors which make you more likely to develop this disease. Disease in men most commonly relates to heavy smoking, while disease in women most commonly relates to bronchiectasis.   What are the names of some common nontuberculous mycobacteria? The most common NTM germs involved in human infection are Mycobacterium avium complex (MAC), M. kansasii, M. chelonae, and M. Abscessus.  Can the  infection spread outside my lungs? In general, the infection does not spread outside the lungs in persons with an otherwise normal immune system. People with a severely compromised immune system, such as persons with HIV infection, persons who are on medications that suppress the immune system (such as prednisone), or persons who have had organ or bone marrow transplants may develop disease outside the lungs.  Can this infection be cured? NTM infections in the lung are hard to treat, requiring multiple antibiotics for a long period of time (usually at least a year). Treatment may consist of pills along with intravenous antibiotics. Even with the best available treatments, the cure rate for NTM lung disease is often low (for example, about 55-65% for MAC lung disease). Also, a person can become reinfected after treatment is completed.  Will this infection kill me? In general, NTM lung infections are slow-moving; most people will not die from the infection, but will die with the infection. Some patients can have these infections for decades and live relatively healthy and productive lives.  What can I do besides take antibiotics to help my body with this infection? There are a number of things you can do to keep yourself healthy. Regular aerobic exercise (brisk walking, bicycling, swimming, etc.) is really important for overall health and to optimize lung function. Doing these activities for at least 30 minutes at least 3 (preferably 5 or more) days per week will help you stay healthy. Getting a flu shot every year is also important, and you should get the pneumonia shot (Pneumovax) at least once. Don't smoke, and stay away from people who are smoking. Wash your hands regularly or use  hand sanitizer after being around crowds or in public places.    More information available at [facility].org from [facility].

## 2022-09-10 NOTE — Progress Notes (Signed)
[facility]      Reason for Consult:MAI pulmonary infection     Referring Physician: Dr. Maple Hudson    Patient ID: Kevin Brooks, male    DOB: September 28, 1945, 77 y.o.   MRN: [id number]  HPI:   Kevin Brooks is here for evaluation of pulmonary nodules. He has a history of lung cancer s/p treatment SBRT and has been followed as well for lung nodules noted on CT scan with new nodules noted in both lungs on CT last week.  He has some worsening symptoms over the last 3-4 months including decreased exercise capacity, increased cough and fatigue.  No fever, no night sweats.  Did lose some weight when he has influenza earlier this year but has been gaining it back.  Had a sputum positive for MAI, everything else negative.    Past Medical History:  Diagnosis Date   BPH (benign prostatic hyperplasia)    Cataracta    COPD (chronic obstructive pulmonary disease) (HCC)    Depression    ED (erectile dysfunction)    GERD (gastroesophageal reflux disease)    Hypertension    Hypogonadism male    Lung cancer (HCC) 11/13/2020   Mixed hyperlipidemia    Palpitations    TIA (transient ischemic attack)    Vision abnormalities     Prior to Admission medications   Medication Sig Start Date End Date Taking? Authorizing Provider  albuterol (VENTOLIN HFA) 108 (90 Base) MCG/ACT inhaler Inhale 2 puffs into the lungs every 6 (six) hours as needed for wheezing or shortness of breath.   Yes [provider]  cholecalciferol (VITAMIN D3) 25 MCG (1000 UNIT) tablet Take 1,000 Units by mouth daily.   Yes [provider]  citalopram (CELEXA) 40 MG tablet Take 40 mg by mouth daily. 08/18/11  Yes [provider]  clopidogrel (PLAVIX) 75 MG tablet Take 75 mg by mouth daily. 11/15/16  Yes [provider]  Fluticasone-Umeclidin-Vilant (TRELEGY ELLIPTA) 100-62.5-25 MCG/INH AEPB Inhale 1 puff into the lungs daily. 10/22/20  Yes Icard, Bradley L, DO  losartan (COZAAR) 100 MG  tablet Take 100 mg by mouth every morning. 02/12/19  Yes [provider]  omeprazole (PRILOSEC) 20 MG capsule Take 20 mg by mouth daily. 03/12/19  Yes [provider]  rosuvastatin (CRESTOR) 5 MG tablet Take 5 mg by mouth daily.   Yes [provider]  tadalafil (CIALIS) 5 MG tablet Take 5 mg by mouth at bedtime.   Yes [provider]  tamsulosin (FLOMAX) 0.4 MG CAPS capsule Take 0.4 mg by mouth at bedtime. 01/01/19  Yes [provider]    No Known Allergies  Social History   Tobacco Use   Smoking status: Every Day    Current packs/day: 1.00    Types: Cigarettes    Passive exposure: Current   Smokeless tobacco: Never   Tobacco comments:    1 pack per day//PAP 08/05/22  Vaping Use   Vaping status: Every Day  Substance Use Topics   Alcohol use: Yes    Alcohol/week: 1.0 standard drink of alcohol    Types: 1 Cans of beer per week    Comment: beer daily   Drug use: No    Family History  Problem Relation Age of Onset   Stroke Father     Review of Systems  Constitutional: negative for fevers and chills All other systems reviewed and are negative    Constitutional: in no apparent distress  Vitals:   09/10/22  1352  BP: 131/79  Pulse: (!) 103  Temp: 98.4 F (36.9 C)  SpO2: 94%   EYES: anicteric Respiratory: normal respiratory effort Musculoskeletal: no edema  Labs: Lab Results  Component Value Date   WBC 6.3 01/01/2022   HGB 15.2 01/01/2022   HCT 42.7 01/01/2022   MCV 95.3 01/01/2022   PLT 139 (L) 01/01/2022    Lab Results  Component Value Date   CREATININE 0.90 08/29/2022   BUN 13 01/01/2022   NA 135 01/01/2022   K 3.3 (L) 01/01/2022   CL 100 01/01/2022   CO2 26 01/01/2022    Lab Results  Component Value Date   ALT 19 01/01/2022   AST 25 01/01/2022   ALKPHOS 39 01/01/2022   BILITOT 0.9 01/01/2022   INR 1.15 05/29/2014     Assessment: pulmonary MAI infection with multiple nodules.  Positive in sputum and  certainly at risk.  Though just one sputum has been sent, I believe pulmonary MAI infection could be active and I discussed treatment options with him.  He is on multiple medications so will need a rifabutin regimen.  Will consider inhaled amikacin at some point as well.    Plan: 1)  start rifabutin, aizthromycin and ethambutol Follow up in 3-4 weeks

## 2022-09-12 ENCOUNTER — Telehealth: Payer: Self-pay | Admitting: *Deleted

## 2022-09-12 NOTE — Telephone Encounter (Signed)
XXXXX

## 2022-09-17 ENCOUNTER — Encounter: Payer: Self-pay | Admitting: Pulmonary Disease

## 2022-09-17 ENCOUNTER — Ambulatory Visit: Payer: Medicare Other | Admitting: Pulmonary Disease

## 2022-09-17 VITALS — BP 140/90 | HR 76 | Ht 67.0 in | Wt 140.0 lb

## 2022-09-17 DIAGNOSIS — J432 Centrilobular emphysema: Secondary | ICD-10-CM

## 2022-09-17 DIAGNOSIS — C3491 Malignant neoplasm of unspecified part of right bronchus or lung: Secondary | ICD-10-CM

## 2022-09-17 DIAGNOSIS — A310 Pulmonary mycobacterial infection: Secondary | ICD-10-CM | POA: Diagnosis not present

## 2022-09-17 DIAGNOSIS — Z85118 Personal history of other malignant neoplasm of bronchus and lung: Secondary | ICD-10-CM

## 2022-09-17 DIAGNOSIS — R918 Other nonspecific abnormal finding of lung field: Secondary | ICD-10-CM

## 2022-09-17 NOTE — Patient Instructions (Signed)
Thank you for visiting Dr. Tonia Brooms at [facility]. Today we recommend the following:  Stay follow up with rad onc and ID  Return in about 6 months (around 03/17/2023).    Please do your part to reduce the spread of COVID-19.

## 2022-09-17 NOTE — Progress Notes (Signed)
Synopsis: Referred in October 2022 for abnormal ct by Mosetta Putt, MD  Subjective:   PATIENT ID: Kevin Brooks GENDER: male DOB: Sep 16, 1945, MRN: [id number]  Chief Complaint  Patient presents with   Follow-up    F/up on PET scan    This is 77 yo M, medical history of COPD, emphysema, gastroesophageal reflux, longstanding history of tobacco abuse.  He has smoked since he was 77 years old.  He has been enrolled in lung cancer screening program.  He had a repeat lung cancer screening CT that was completed last month with revealed a right lower lobe subsolid lesion.  Patient underwent PET scan for this which revealed low-level metabolic uptake concerning for indolent neoplasm.  Additionally has a lesion found within his spleen that was hypermetabolic with SUV of 7.  Patient was referred today to discuss neck steps regarding lung nodule and consideration for biopsy.  He does have COPD currently managed with Spiriva and Serevent.  OV 02/20/2021: Patient last seen in the office for evaluation of a lung nodule.  Was taken for bronchoscopy with tissue sampling and biopsy confirming non-small cell lung cancer.  Patient has established care with radiation oncology and completing treatments with SBRT.Consult from Dr. Mitzi Hansen on 12/12/2020 reviewed.  Patient does however have a new nodule in the left upper lobe in comparison to the previous CTs.  He had ongoing cough shortness of breath sputum production.  Recent CT scan in January showed a posterior infiltrate he was treated with 14 days of doxycycline.  He feels much better since then.  OV 05/06/2021: Here today for follow-up after recent CT scan of the chest.  Was also started on Trelegy.  Still using Trelegy.CT was completed on 05/01/2021 which revealed some new patchy airspace opacity and interstitial thickening in the right lung base.  There was interval decrease in the size of the pulmonary lesions and other scattered nodules.  He has some stable  coronary calcifications.  Discussed this with the patient today reviewed his imaging.  We reviewed his CT scan results.  From a respiratory standpoint he is breathing okay.  He is completing his aerobic exercise class on Monday and Friday.  OV 08/06/2021: Here today for follow-up.Patient here today for follow-up CT imaging in was completed on 07/23/2021.  CT reveals evolving radiation changes in the right lower lobe.  He has a few enlarging right lung nodules including a 6 mm nodule in the right upper lobe which is new from previous imaging.  OV 11/06/2021: Here today for follow-up regarding COPD and recent CT imaging of the chest.  Nodules that we were following shows some resolution he does have a new small nodule in the right upper lobe.  He can has some waxing and waning nodular infiltrates in the past.  We reviewed these today in the office.  I think he needs a repeat CT in 6 months.  He has been using his Trelegy daily.  He had some soreness in his throat recently went to the dentist was diagnosed with thrush.  He is not taking anything for this.  He is rinsing his mouth out after use.  OV 09/17/2022: Patient seen today for follow-up.  She he has multiple nodules in the chest.  They have waxed and waned for several years now.  Unfortunately since he has a history of malignancy we have had to follow these closely to ensure no significant changes.  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  So we will follow-up to see how he is doing after his repeat CT imaging.  He has a scan scheduled in November with follow-up at radiation oncology.    Past Medical History:  Diagnosis Date   BPH (benign prostatic hyperplasia)    Cataracta    COPD (chronic obstructive pulmonary disease) (HCC)    Depression    ED (erectile dysfunction)     GERD (gastroesophageal reflux disease)    Hypertension    Hypogonadism male    Lung cancer (HCC) 11/13/2020   Mixed hyperlipidemia    Palpitations    TIA (transient ischemic attack)    Vision abnormalities      Family History  Problem Relation Age of Onset   Stroke Father      Past Surgical History:  Procedure Laterality Date    catarcts surgery     BRONCHIAL BIOPSY  11/13/2020   Procedure: BRONCHIAL BIOPSIES;  Surgeon: [name], DO;  Location: [facility];  Service: Pulmonary;;   BRONCHIAL BRUSHINGS  11/13/2020   Procedure: BRONCHIAL BRUSHINGS;  Surgeon: [name], DO;  Location: [facility];  Service: Pulmonary;;   BRONCHIAL NEEDLE ASPIRATION BIOPSY  11/13/2020   Procedure: BRONCHIAL NEEDLE ASPIRATION BIOPSIES;  Surgeon: [name], DO;  Location: [facility];  Service: Pulmonary;;   COLONOSCOPY     COLONOSCOPY WITH PROPOFOL N/A 03/16/2020   Procedure: COLONOSCOPY WITH PROPOFOL;  Surgeon: Jeani Hawking, MD;  Location: [facility];  Service: Endoscopy;  Laterality: N/A;   ENTEROSCOPY N/A 03/16/2020   Procedure: ENTEROSCOPY;  Surgeon: Jeani Hawking, MD;  Location: [facility];  Service: Endoscopy;  Laterality: N/A;   HEMOSTASIS CLIP PLACEMENT  03/16/2020   Procedure: HEMOSTASIS CLIP PLACEMENT;  Surgeon: Jeani Hawking, MD;  Location: [facility];  Service: Endoscopy;;   HOT HEMOSTASIS N/A 03/16/2020   Procedure: HOT HEMOSTASIS (ARGON PLASMA COAGULATION/BICAP);  Surgeon: Jeani Hawking, MD;  Location: [facility];  Service: Endoscopy;  Laterality: N/A;   POLYPECTOMY  03/16/2020   Procedure: POLYPECTOMY;  Surgeon: Jeani Hawking, MD;  Location: [facility];  Service: Endoscopy;;   VIDEO BRONCHOSCOPY WITH ENDOBRONCHIAL NAVIGATION Right 11/13/2020   Procedure: VIDEO BRONCHOSCOPY WITH ENDOBRONCHIAL NAVIGATION;  Surgeon: [name], DO;  Location: [facility];  Service: Pulmonary;  Laterality: Right;  ION w/ fiducial placement   VIDEO BRONCHOSCOPY WITH RADIAL  ENDOBRONCHIAL ULTRASOUND  11/13/2020   Procedure: RADIAL ENDOBRONCHIAL ULTRASOUND;  Surgeon: [name], DO;  Location: [facility];  Service: Pulmonary;;    Social History   Socioeconomic History   Marital status: Single    Spouse name: Not on file   Number of children: Not on file   Years of education: Not on file   Highest education level: Not on file  Occupational History   Not on file  Tobacco Use   Smoking status: Every Day    Current packs/day: 1.00    Types: Cigarettes    Passive exposure: Current   Smokeless tobacco: Never   Tobacco comments:    1 pack per day//Tay 09/17/22  Vaping Use   Vaping status: Every Day  Substance and Sexual Activity   Alcohol use: Yes    Alcohol/week: 1.0 standard drink of alcohol    Types: 1 Cans of beer per week    Comment: beer daily   Drug use: No   Sexual  activity: Not on file  Other Topics Concern   Not on file  Social History Narrative   Not on file   Social Determinants of Health   Financial Resource Strain: Not on file  Food Insecurity: No Food Insecurity (09/08/2022)   Hunger Vital Sign    Worried About Running Out of Food in the Last Year: Never true    Ran Out of Food in the Last Year: Never true  Transportation Needs: No Transportation Needs (09/08/2022)   PRAPARE - Administrator, Civil Service (Medical): No    Lack of Transportation (Non-Medical): No  Physical Activity: Not on file  Stress: Not on file  Social Connections: Not on file  Intimate Partner Violence: Not At Risk (09/08/2022)   Humiliation, Afraid, Rape, and Kick questionnaire    Fear of Current or Ex-Partner: No    Emotionally Abused: No    Physically Abused: No    Sexually Abused: No     No Known Allergies   Outpatient Medications Prior to Visit  Medication Sig Dispense Refill   albuterol (VENTOLIN HFA) 108 (90 Base) MCG/ACT inhaler Inhale 2 puffs into the lungs every 6 (six) hours as needed for wheezing or shortness of breath.      azithromycin (ZITHROMAX) 250 MG tablet Take one tablet three times weekly 12 each 5   cholecalciferol (VITAMIN D3) 25 MCG (1000 UNIT) tablet Take 1,000 Units by mouth daily.     citalopram (CELEXA) 40 MG tablet Take 40 mg by mouth daily.     clopidogrel (PLAVIX) 75 MG tablet Take 75 mg by mouth daily.  1   ethambutol (MYAMBUTOL) 400 MG tablet Take 4 tablets (1,600 mg total) by mouth 3 (three) times a week. 48 tablet 11   Fluticasone-Umeclidin-Vilant (TRELEGY ELLIPTA) 100-62.5-25 MCG/INH AEPB Inhale 1 puff into the lungs daily. 60 each 6   losartan (COZAAR) 100 MG tablet Take 100 mg by mouth every morning.     omeprazole (PRILOSEC) 20 MG capsule Take 20 mg by mouth daily.     rifabutin (MYCOBUTIN) 150 MG capsule Take 2 capsules (300 mg total) by mouth 3 (three) times a week. 24 capsule 5   rosuvastatin (CRESTOR) 5 MG tablet Take 5 mg by mouth daily.     tadalafil (CIALIS) 5 MG tablet Take 5 mg by mouth at bedtime.     tamsulosin (FLOMAX) 0.4 MG CAPS capsule Take 0.4 mg by mouth at bedtime.     No facility-administered medications prior to visit.    Review of Systems  Constitutional:  Negative for chills, fever, malaise/fatigue and weight loss.  HENT:  Negative for hearing loss, sore throat and tinnitus.   Eyes:  Negative for blurred vision and double vision.  Respiratory:  Negative for cough, hemoptysis, sputum production, shortness of breath, wheezing and stridor.   Cardiovascular:  Negative for chest pain, palpitations, orthopnea, leg swelling and PND.  Gastrointestinal:  Negative for abdominal pain, constipation, diarrhea, heartburn, nausea and vomiting.  Genitourinary:  Negative for dysuria, hematuria and urgency.  Musculoskeletal:  Negative for joint pain and myalgias.  Skin:  Negative for itching and rash.  Neurological:  Negative for dizziness, tingling, weakness and headaches.  Endo/Heme/Allergies:  Negative for environmental allergies. Does not bruise/bleed easily.   Psychiatric/Behavioral:  Negative for depression. The patient is not nervous/anxious and does not have insomnia.   All other systems reviewed and are negative.    Objective:  Physical Exam Vitals reviewed.  Constitutional:      General:  He is not in acute distress.    Appearance: He is well-developed.  HENT:     Head: Normocephalic and atraumatic.  Eyes:     General: No scleral icterus.    Conjunctiva/sclera: Conjunctivae normal.     Pupils: Pupils are equal, round, and reactive to light.  Neck:     Vascular: No JVD.     Trachea: No tracheal deviation.  Cardiovascular:     Rate and Rhythm: Normal rate and regular rhythm.     Heart sounds: Normal heart sounds. No murmur heard. Pulmonary:     Effort: Pulmonary effort is normal. No tachypnea, accessory muscle usage or respiratory distress.     Breath sounds: No stridor. No wheezing, rhonchi or rales.     Comments: Diminished breath sounds bilaterally Abdominal:     General: Bowel sounds are normal. There is no distension.     Palpations: Abdomen is soft.     Tenderness: There is no abdominal tenderness.  Musculoskeletal:        General: No tenderness.     Cervical back: Neck supple.  Lymphadenopathy:     Cervical: No cervical adenopathy.  Skin:    General: Skin is warm and dry.     Capillary Refill: Capillary refill takes less than 2 seconds.     Findings: No rash.  Neurological:     Mental Status: He is alert and oriented to person, place, and time.  Psychiatric:        Behavior: Behavior normal.      Vitals:   09/17/22 1051  BP: (!) 140/90  Pulse: 76  SpO2: 95%  Weight: 140 lb (63.5 kg)  Height: 5\' 7"  (1.702 m)    95% on RA BMI Readings from Last 3 Encounters:  09/17/22 21.93 kg/m  09/10/22 21.77 kg/m  08/05/22 21.86 kg/m   Wt Readings from Last 3 Encounters:  09/17/22 140 lb (63.5 kg)  09/10/22 139 lb (63 kg)  08/05/22 139 lb 9.6 oz (63.3 kg)     CBC    Component Value Date/Time   WBC 6.3  01/01/2022 0553   RBC 4.48 01/01/2022 0553   HGB 15.2 01/01/2022 0553   HCT 42.7 01/01/2022 0553   PLT 139 (L) 01/01/2022 0553   MCV 95.3 01/01/2022 0553   MCH 33.9 01/01/2022 0553   MCHC 35.6 01/01/2022 0553   RDW 11.9 01/01/2022 0553   LYMPHSABS 0.7 12/30/2021 2352   MONOABS 0.7 12/30/2021 2352   EOSABS 0.0 12/30/2021 2352   BASOSABS 0.1 12/30/2021 2352    Chest Imaging: 09/26/2020: Nuclear medicine pet imaging reveals a subsolid pulmonary nodule within the right right lower lobe at 1.4 cm low-level SUV concerning for malignancy. Also splenic lesion which is hypermetabolic concerning for lymphoma versus metastasis. The patient's images have been independently reviewed by me.    January 2023 CT chest: Multiple pulmonary nodules, new in the left upper lobe.  Evidence of severe centrilobular emphysema. The patient's images have been independently reviewed by me.    July 2023 CT chest: Radiation changes of the right lower lobe, enlarging right lung nodules including a 6 mm inferior nodule of the right upper lobe new from previous imaging.  Other scattered small pulmonary nodules. The patient's images have been independently reviewed by me.    October 2023 CT chest: Radiation changes to the right lower lobe.  Scattered nodules within the chest changing in size suggesting inflammatory lesion.  However he remains high risk for malignancy due to his prior history.  Has evidence of centrilobular emphysema. The patient's images have been independently reviewed by me.    August 2024 CT chest: Multiple pulmonary nodules They have waxed and waned in size.  ThereIs a few new nodules that due to his history of malignancy need short-term follow-up. The patient's images have been independently reviewed by me.      Pulmonary Functions Testing Results:    Latest Ref Rng & Units 09/13/2020   11:03 AM  PFT Results  FVC-Pre L 2.85   FVC-Predicted Pre % 71   FVC-Post L 3.35   FVC-Predicted  Post % 84   Pre FEV1/FVC % % 50   Post FEV1/FCV % % 48   FEV1-Pre L 1.42   FEV1-Predicted Pre % 49   FEV1-Post L 1.60   DLCO uncorrected ml/min/mmHg 14.46   DLCO UNC% % 60   DLCO corrected ml/min/mmHg 14.46   DLCO COR %Predicted % 60   DLVA Predicted % 57   TLC L 8.20   TLC % Predicted % 123   RV % Predicted % 191     FeNO:   Pathology:   Echocardiogram:   Heart Catheterization:     Assessment & Plan:     ICD-10-CM   1. 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 History of lung cancer  Z85.118     4. Centrilobular emphysema (HCC)  J43.2     5. 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  Has other multiple small pulmonary nodules that have waxed and waned in size.  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  He started on treatments with triple therapy regimen and infectious disease.  Plan: He has multiple pulmonary nodules that I think need close follow-up.  He has repeat noncontrast CT scan ordered in November 2024. Continue Breztri inhaler he is doing much better with this. I am sure Monico Blitz, PA from radiation oncology will let me know if there is any dramatic change in his nodules on his repeat scan in November. Otherwise can follow-up with me in 6 months or as needed.   Current Outpatient Medications:    albuterol (VENTOLIN HFA) 108 (90 Base) MCG/ACT inhaler, Inhale 2 puffs into the lungs every 6 (six) hours as needed for wheezing or shortness of breath., Disp: , Rfl:    azithromycin (ZITHROMAX) 250 MG tablet, Take one tablet three times weekly, Disp: 12 each, Rfl: 5   cholecalciferol (VITAMIN D3) 25 MCG (1000 UNIT) tablet, Take 1,000 Units by mouth daily., Disp: , Rfl:    citalopram (CELEXA) 40 MG tablet, Take 40 mg by  mouth daily., Disp: , Rfl:    clopidogrel (PLAVIX) 75 MG tablet, Take 75 mg by mouth daily., Disp: , Rfl: 1   ethambutol (MYAMBUTOL) 400 MG tablet, Take 4 tablets (1,600 mg total) by mouth 3 (three) times a week., Disp: 48 tablet, Rfl: 11   Fluticasone-Umeclidin-Vilant (TRELEGY ELLIPTA) 100-62.5-25 MCG/INH AEPB, Inhale 1 puff into the lungs daily., Disp: 60 each, Rfl: 6   losartan (COZAAR) 100 MG tablet, Take 100 mg by mouth every morning., Disp: , Rfl:    omeprazole (PRILOSEC) 20 MG capsule, Take 20 mg by mouth daily., Disp: , Rfl:    rifabutin (MYCOBUTIN) 150 MG capsule, Take 2 capsules (  300 mg total) by mouth 3 (three) times a week., Disp: 24 capsule, Rfl: 5   rosuvastatin (CRESTOR) 5 MG tablet, Take 5 mg by mouth daily., Disp: , Rfl:    tadalafil (CIALIS) 5 MG tablet, Take 5 mg by mouth at bedtime., Disp: , Rfl:    tamsulosin (FLOMAX) 0.4 MG CAPS capsule, Take 0.4 mg by mouth at bedtime., Disp: , Rfl:     [name], DO [facility] 09/17/2022 5:07 PM

## 2022-10-08 ENCOUNTER — Encounter: Payer: Self-pay | Admitting: Internal Medicine

## 2022-10-08 ENCOUNTER — Other Ambulatory Visit: Payer: Self-pay

## 2022-10-08 ENCOUNTER — Ambulatory Visit: Payer: Medicare Other | Admitting: Internal Medicine

## 2022-10-08 VITALS — BP 170/85 | HR 72 | Temp 97.9°F | Ht 67.0 in | Wt 138.0 lb

## 2022-10-08 DIAGNOSIS — A310 Pulmonary mycobacterial infection: Secondary | ICD-10-CM

## 2022-10-08 NOTE — Progress Notes (Signed)
Subjective:    Patient ID: Kevin Brooks, male    DOB: [birth year], 77 y.o.   MRN: [id number]  HPI Kevin Brooks is here for follow up of MAI pulmonary infection.  He started on three drug treatment three times a week with rifabutin, azithromycin and ethambutol and tolerating well.  Some fatigue.  No GI issues.  PO intake good.  Some increased congestion.    Review of Systems  Constitutional:  Negative for fever.  Gastrointestinal:  Negative for diarrhea and nausea.       Objective:   Physical Exam Eyes:     General: No scleral icterus. Pulmonary:     Effort: Pulmonary effort is normal.  Neurological:     Mental Status: He is alert.   SH: + tobacco        Assessment & Plan:

## 2022-10-08 NOTE — Assessment & Plan Note (Signed)
He is doing well on treatment and no concerns with being able to continue on it.  Will check his hepatic function panel and have him return in December after getting his repeat CT scan.   Will decide after the CT if any improvement thought to be related to the treatment or if worsening and more of concern for other process  I have personally spent 32 minutes involved in face-to-face and non-face-to-face activities for this patient on the day of the visit. Professional time spent includes the following activities: Preparing to see the patient (review of tests), Obtaining and/or reviewing separately obtained history (admission/discharge record), Performing a medically appropriate examination and/or evaluation , Ordering medications/tests/procedures, referring and communicating with other health care professionals, Documenting clinical information in the EMR, Independently interpreting results (not separately reported), Communicating results to the patient/family/caregiver, Counseling and educating the patient/family/caregiver and Care coordination (not separately reported).

## 2022-10-09 LAB — CBC
HCT: 46.9 % (ref 38.5–50.0)
Hemoglobin: 15.9 g/dL (ref 13.2–17.1)
MCH: 33.6 pg — ABNORMAL HIGH (ref 27.0–33.0)
MCHC: 33.9 g/dL (ref 32.0–36.0)
MCV: 99.2 fL (ref 80.0–100.0)
MPV: 11.9 fL (ref 7.5–12.5)
Platelets: 160 10*3/uL (ref 140–400)
RBC: 4.73 10*6/uL (ref 4.20–5.80)
RDW: 11.8 % (ref 11.0–15.0)
WBC: 7.2 10*3/uL (ref 3.8–10.8)

## 2022-10-09 LAB — HEPATIC FUNCTION PANEL
AG Ratio: 1.8 (calc) (ref 1.0–2.5)
ALT: 15 U/L (ref 9–46)
AST: 19 U/L (ref 10–35)
Albumin: 4.1 g/dL (ref 3.6–5.1)
Alkaline phosphatase (APISO): 53 U/L (ref 35–144)
Bilirubin, Direct: 0.2 mg/dL (ref 0.0–0.2)
Globulin: 2.3 g/dL (calc) (ref 1.9–3.7)
Indirect Bilirubin: 0.5 mg/dL (calc) (ref 0.2–1.2)
Total Bilirubin: 0.7 mg/dL (ref 0.2–1.2)
Total Protein: 6.4 g/dL (ref 6.1–8.1)

## 2022-12-10 ENCOUNTER — Ambulatory Visit (HOSPITAL_BASED_OUTPATIENT_CLINIC_OR_DEPARTMENT_OTHER)
Admission: RE | Admit: 2022-12-10 | Discharge: 2022-12-10 | Disposition: A | Discharge status: Home / Self Care | Payer: Medicare Other | Source: Ambulatory Visit | Attending: Radiation Oncology | Admitting: Radiation Oncology

## 2022-12-10 DIAGNOSIS — R918 Other nonspecific abnormal finding of lung field: Secondary | ICD-10-CM | POA: Diagnosis present

## 2022-12-10 DIAGNOSIS — C3431 Malignant neoplasm of lower lobe, right bronchus or lung: Secondary | ICD-10-CM | POA: Diagnosis present

## 2022-12-10 LAB — POCT I-STAT CREATININE: Creatinine, Ser: 1 mg/dL (ref 0.61–1.24)

## 2022-12-10 MED ORDER — IOHEXOL 300 MG/ML  SOLN
75.0000 mL | Freq: Once | INTRAMUSCULAR | Status: AC | PRN
  Administered 2022-12-10: 75 mL via INTRAVENOUS

## 2022-12-15 ENCOUNTER — Ambulatory Visit
Admission: RE | Admit: 2022-12-15 | Discharge: 2022-12-15 | Disposition: A | Discharge status: Home / Self Care | Payer: Medicare Other | Source: Ambulatory Visit | Attending: Radiation Oncology | Admitting: Radiation Oncology

## 2022-12-15 ENCOUNTER — Encounter: Payer: Self-pay | Admitting: Radiation Oncology

## 2022-12-15 DIAGNOSIS — C3431 Malignant neoplasm of lower lobe, right bronchus or lung: Secondary | ICD-10-CM

## 2022-12-15 NOTE — Progress Notes (Signed)
Telephone nursing appointment for review of most recent CT of chest results. I verified patient's identity x2 and began nursing interview.   Patient reports doing well. Patient denies any related issues at this time.   Meaningful use complete.   Patient aware of their 1:30pm-12/15/2022 telephone appointment w/ [name] [name]. I left my extension [phone] in case patient needs anything. Patient verbalized understanding. This concludes the nursing interview.   Patient contact [phone]     Ruel Favors, LPN
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
## 2022-12-17 ENCOUNTER — Other Ambulatory Visit: Payer: Self-pay | Admitting: Radiation Oncology

## 2022-12-17 DIAGNOSIS — C3431 Malignant neoplasm of lower lobe, right bronchus or lung: Secondary | ICD-10-CM

## 2022-12-23 ENCOUNTER — Encounter: Payer: Self-pay | Admitting: Radiation Oncology

## 2022-12-30 ENCOUNTER — Encounter: Payer: Self-pay | Admitting: Internal Medicine

## 2022-12-30 ENCOUNTER — Ambulatory Visit: Payer: Medicare Other | Admitting: Internal Medicine

## 2022-12-30 ENCOUNTER — Other Ambulatory Visit: Payer: Self-pay

## 2022-12-30 VITALS — BP 162/88 | HR 68 | Temp 98.0°F | Resp 18 | Ht 69.0 in | Wt 140.0 lb

## 2022-12-30 DIAGNOSIS — A310 Pulmonary mycobacterial infection: Secondary | ICD-10-CM | POA: Diagnosis not present

## 2022-12-30 NOTE — Progress Notes (Signed)
   Subjective:    Patient ID: Kevin Brooks, male    DOB: [birth year], 77 y.o.   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   He started on three drug treatment three times a week with rifabutin, azithromycin and ethambutol and tolerating well.  Some fatigue but keeping active, going to an exercise class three times a week.  Recent CT scan done and some improvement in some lesions but also some increasing spiculated areas.  He is having no sob, no fever.     Review of Systems  Constitutional:  Negative for chills and fever.  Gastrointestinal:  Negative for diarrhea.  Skin:  Negative for rash.       Objective:   Physical Exam Eyes:     General: No scleral icterus. Pulmonary:     Effort: Pulmonary effort is normal.  Neurological:     Mental Status: He is alert.   SH: + tobacco        Assessment & Plan:

## 2022-12-30 NOTE — Assessment & Plan Note (Signed)
I discussed the treatment with him, reviewed the CT scan and discussed options.  There is some improvement overall radiographically after 4 months of treatment, though some areas of worsening. Still unclear how much infection is affecting anything.  He though is tolerating the medications well so after discussing with him the options, will have him continue on treatment.  Also, in regards to steroids, I agree with Dr. Tonia Brooms that it is ideal to avoid unless necessary.    I have personally spent 32 minutes involved in face-to-face and non-face-to-face activities for this patient on the day of the visit. Professional time spent includes the following activities: Preparing to see the patient (review of tests), Obtaining and/or reviewing separately obtained history (admission/discharge record), Performing a medically appropriate examination and/or evaluation , Ordering medications/tests/procedures, referring and communicating with other health care professionals, Documenting clinical information in the EMR, Independently interpreting results (not separately reported), Communicating results to the patient/family/caregiver, Counseling and educating the patient/family/caregiver and Care coordination (not separately reported).

## 2023-02-22 ENCOUNTER — Other Ambulatory Visit: Payer: Self-pay | Admitting: Internal Medicine

## 2023-03-17 ENCOUNTER — Ambulatory Visit (HOSPITAL_BASED_OUTPATIENT_CLINIC_OR_DEPARTMENT_OTHER)
Admission: RE | Admit: 2023-03-17 | Discharge: 2023-03-17 | Disposition: A | Discharge status: Home / Self Care | Payer: Medicare Other | Source: Ambulatory Visit | Attending: Radiation Oncology | Admitting: Radiation Oncology

## 2023-03-17 DIAGNOSIS — C3431 Malignant neoplasm of lower lobe, right bronchus or lung: Secondary | ICD-10-CM | POA: Insufficient documentation

## 2023-03-17 LAB — POCT I-STAT CREATININE: Creatinine, Ser: 1 mg/dL (ref 0.61–1.24)

## 2023-03-17 MED ORDER — IOHEXOL 300 MG/ML  SOLN
100.0000 mL | Freq: Once | INTRAMUSCULAR | Status: AC | PRN
  Administered 2023-03-17: 80 mL via INTRAVENOUS

## 2023-03-23 ENCOUNTER — Encounter: Payer: Self-pay | Admitting: Radiation Oncology

## 2023-03-23 ENCOUNTER — Ambulatory Visit
Admission: RE | Admit: 2023-03-23 | Discharge: 2023-03-23 | Disposition: A | Discharge status: Home / Self Care | Payer: Medicare Other | Source: Ambulatory Visit | Attending: Radiation Oncology | Admitting: Radiation Oncology

## 2023-03-23 DIAGNOSIS — C3431 Malignant neoplasm of lower lobe, right bronchus or lung: Secondary | ICD-10-CM

## 2023-03-23 NOTE — Progress Notes (Addendum)
 Telephone nursing appointment for review of most recent CT scan results. I verified patient's identity x2 and began nursing interview.   Patient reports SOB w/ over exertion, and moderate fatigue. Patient denies chest/arm pain, dizziness. No other related issues conveyed at this time.   Meaningful use complete.   Patient aware of their 1:30pm-03/23/23 telephone appointment w/ [name] [name]. I left my extension [phone] in case patient needs anything. Patient verbalized understanding. This concludes the nursing interview.   Patient contact [phone]     Ruel Favors, LPN
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
## 2023-04-01 ENCOUNTER — Ambulatory Visit: Payer: Medicare Other | Admitting: Emergency Medicine

## 2023-04-01 ENCOUNTER — Encounter: Payer: Self-pay | Admitting: Emergency Medicine

## 2023-04-01 VITALS — BP 138/82 | HR 71 | Ht 67.0 in | Wt 139.0 lb

## 2023-04-01 DIAGNOSIS — R918 Other nonspecific abnormal finding of lung field: Secondary | ICD-10-CM | POA: Diagnosis not present

## 2023-04-01 DIAGNOSIS — Z720 Tobacco use: Secondary | ICD-10-CM | POA: Diagnosis not present

## 2023-04-01 DIAGNOSIS — C3431 Malignant neoplasm of lower lobe, right bronchus or lung: Secondary | ICD-10-CM | POA: Diagnosis not present

## 2023-04-01 DIAGNOSIS — J449 Chronic obstructive pulmonary disease, unspecified: Secondary | ICD-10-CM

## 2023-04-01 DIAGNOSIS — J441 Chronic obstructive pulmonary disease with (acute) exacerbation: Secondary | ICD-10-CM | POA: Insufficient documentation

## 2023-04-01 NOTE — Assessment & Plan Note (Signed)
 You need to work on decreasing your cigarettes.  We will try to help you if possible.  Ultimate goal will be to stop altogether.

## 2023-04-01 NOTE — Assessment & Plan Note (Signed)
 With severe obstruction and bronchodilator response noted in 09/2020.  He does have asthmatic features, mucus production.  Unfortunately continue to smoke.  He benefits from Trelegy but sometimes does not feel like he gets good deposition.  Ask him to try taking his albuterol about 10 minutes prior to help with this.  Please continue your Trelegy 1 inhalation once daily.  Rinse and gargle after using. Use your albuterol 2 puffs up to every 4 hours if needed for shortness of breath, chest tightness, wheezing.  You could consider taking 2 puffs in the morning prior to your Trelegy so you get better deposition of your maintenance medication. We will obtain a flutter valve.  You can use this as needed through the day to help you clear mucus. Follow with Dr Delton Coombes in 6 months or sooner if you have any problems

## 2023-04-01 NOTE — Assessment & Plan Note (Signed)
 Overall stable to improved CT chest earlier this month without any evidence of recurrence of his lung cancer.  His nodules are either stable or smaller on Mycobacterium avium therapy.  Suspect that the existing nodules are related to that infection but they will need surveillance.  His next CT is planned and radiation oncology in 6 months.

## 2023-04-01 NOTE — Patient Instructions (Addendum)
 We reviewed your CT scan of the chest today.  Get your repeat CT chest in 6 months as planned by radiation oncology. Continue your antibiotics for Mycobacterium avium as directed by Dr. Luciana Axe Please continue your Trelegy 1 inhalation once daily.  Rinse and gargle after using. Use your albuterol 2 puffs up to every 4 hours if needed for shortness of breath, chest tightness, wheezing.  You could consider taking 2 puffs in the morning prior to your Trelegy so you get better deposition of your maintenance medication. We will obtain a flutter valve.  You can use this as needed through the day to help you clear mucus. You need to work on decreasing your cigarettes.  We will try to help you if possible.  Ultimate goal will be to stop altogether. Follow with Dr Delton Coombes in 6 months or sooner if you have any problems

## 2023-04-01 NOTE — Addendum Note (Signed)
 Addended byFloydene Flock, Ethin Drummond A on: 04/01/2023 11:38 AM   Modules accepted: Orders

## 2023-04-01 NOTE — Progress Notes (Signed)
 Subjective:    Patient ID: Kevin Brooks, male    DOB: [birth year], 78 y.o.   MRN: [id number]  HPI 78 year old gentleman with a history of tobacco use (pack years), COPD with severe obstruction and a positive bronchodilator response based on pulmonary function testing 09/2020.  He has been followed in our office by Dr. Tonia Brooms last seen 09/2022 for this as well as right lower lobe non-small cell lung cancer diagnosed by bronchoscopy. He completed SBRT 11/2020.  They have been following waxing and waning pulmonary nodules on serial CT scans of the chest since that time.  Sputum culture 08/06/2022 showed Mycobacterium avium and he has been on treatment with Dr. Luciana Axe in [place] since 08/2022 Currently managed on Trelegy, has albuterol and uses  He has been on rx for [facility] since August 2024, tolerating 3 drug therapy  He is planning a repeat CT chest in 6 months w Rad Onc Coughs in the am, tan to clear, no hemoptysis. He then has some cough through the day.  He has fatigue.  He is smoking 1 pk/day. On Trelegy and believes that he tolerates and benefits. Uses albuterol a few times a week. He has not had any pred or extra abx since he was last seen here.   CT chest 03/2023 reviewed by me, shows stable posttreatment changes at the right lung base without any evidence of recurrence.  Stable to slight decrease in size of scattered bilateral lung nodules without any evidence of progressive disease: 6 mm left upper lobe, 1.1 cm right upper lobe, 9 mm right upper lobe, 7 mm right upper lobe, 7 mm right lower lobe, 1.1 cm left upper lobe nodules all stable or smaller.   Review of Systems As per HPI  Past Medical History:  Diagnosis Date   BPH (benign prostatic hyperplasia)    Cataracta    COPD (chronic obstructive pulmonary disease) (HCC)    Depression    ED (erectile dysfunction)    GERD (gastroesophageal reflux disease)    Hypertension    Hypogonadism male    Lung cancer (HCC) 11/13/2020   Mixed  hyperlipidemia    Palpitations    TIA (transient ischemic attack)    Vision abnormalities      Family History  Problem Relation Age of Onset   Stroke Father      Social History   Socioeconomic History   Marital status: Single    Spouse name: Not on file   Number of children: Not on file   Years of education: Not on file   Highest education level: Not on file  Occupational History   Not on file  Tobacco Use   Smoking status: Every Day    Current packs/day: 1.00    Types: Cigarettes    Passive exposure: Current   Smokeless tobacco: Never   Tobacco comments:    1 pack per day// not ready to quit 04/01/23  Vaping Use   Vaping status: Every Day  Substance and Sexual Activity   Alcohol use: Yes    Alcohol/week: 1.0 standard drink of alcohol    Types: 1 Cans of beer per week    Comment: beer daily   Drug use: No   Sexual activity: Not Currently  Other Topics Concern   Not on file  Social History Narrative   Not on file   Social Drivers of Health   Financial Resource Strain: Not on file  Food Insecurity: No Food Insecurity (03/23/2023)   Hunger Vital Sign  Worried About Programme researcher, broadcasting/film/video in the Last Year: Never true    Ran Out of Food in the Last Year: Never true  Transportation Needs: No Transportation Needs (03/23/2023)   PRAPARE - Administrator, Civil Service (Medical): No    Lack of Transportation (Non-Medical): No  Physical Activity: Not on file  Stress: Not on file  Social Connections: Not on file  Intimate Partner Violence: Not At Risk (03/23/2023)   Humiliation, Afraid, Rape, and Kick questionnaire    Fear of Current or Ex-Partner: No    Emotionally Abused: No    Physically Abused: No    Sexually Abused: No     No Known Allergies   Outpatient Medications Prior to Visit  Medication Sig Dispense Refill   albuterol (VENTOLIN HFA) 108 (90 Base) MCG/ACT inhaler Inhale 2 puffs into the lungs every 6 (six) hours as needed for wheezing or  shortness of breath.     azithromycin (ZITHROMAX) 250 MG tablet TAKE 1 TABLET BY MOUTH 3 TIMES WEEKLY 12 tablet 1   cholecalciferol (VITAMIN D3) 25 MCG (1000 UNIT) tablet Take 1,000 Units by mouth daily.     citalopram (CELEXA) 40 MG tablet Take 40 mg by mouth daily.     clopidogrel (PLAVIX) 75 MG tablet Take 75 mg by mouth daily.  1   ethambutol (MYAMBUTOL) 400 MG tablet Take 4 tablets (1,600 mg total) by mouth 3 (three) times a week. 48 tablet 11   Fluticasone-Umeclidin-Vilant (TRELEGY ELLIPTA) 100-62.5-25 MCG/INH AEPB Inhale 1 puff into the lungs daily. 60 each 6   losartan (COZAAR) 100 MG tablet Take 100 mg by mouth every morning.     omeprazole (PRILOSEC) 20 MG capsule Take 20 mg by mouth daily.     rifabutin (MYCOBUTIN) 150 MG capsule TAKE 2 CAPSULES(300 MG) BY MOUTH 3 TIMES A WEEK 24 capsule 1   rosuvastatin (CRESTOR) 5 MG tablet Take 5 mg by mouth daily.     tadalafil (CIALIS) 5 MG tablet Take 5 mg by mouth at bedtime.     tamsulosin (FLOMAX) 0.4 MG CAPS capsule Take 0.4 mg by mouth at bedtime.     No facility-administered medications prior to visit.        Objective:   Physical Exam Vitals:   04/01/23 1037  BP: 138/82  Pulse: 71  SpO2: 97%  Weight: 139 lb (63 kg)  Height: 5\' 7"  (1.702 m)   Gen: Pleasant, well-nourished, in no distress,  normal affect  ENT: No lesions,  mouth clear,  oropharynx clear, no postnasal drip  Neck: No JVD, no stridor  Lungs: No use of accessory muscles, no crackles or wheezing on normal respiration, no wheeze on forced expiration  Cardiovascular: RRR, heart sounds normal, no murmur or gallops, no peripheral edema  Musculoskeletal: No deformities, no cyanosis or clubbing  Neuro: alert, awake, non focal  Skin: Warm, no lesions or rash      Assessment & Plan:  Lung nodules Overall stable to improved CT chest earlier this month without any evidence of recurrence of his lung cancer.  His nodules are either stable or smaller on  Mycobacterium avium therapy.  Suspect that the existing nodules are related to that infection but they will need surveillance.  His next CT is planned and radiation oncology in 6 months.  COPD (chronic obstructive pulmonary disease) (HCC) With severe obstruction and bronchodilator response noted in 09/2020.  He does have asthmatic features, mucus production.  Unfortunately continue to smoke.  He benefits  from Trelegy but sometimes does not feel like he gets good deposition.  Ask him to try taking his albuterol about 10 minutes prior to help with this.  Please continue your Trelegy 1 inhalation once daily.  Rinse and gargle after using. Use your albuterol 2 puffs up to every 4 hours if needed for shortness of breath, chest tightness, wheezing.  You could consider taking 2 puffs in the morning prior to your Trelegy so you get better deposition of your maintenance medication. We will obtain a flutter valve.  You can use this as needed through the day to help you clear mucus. Follow with Dr Delton Coombes in 6 months or sooner if you have any problems  Tobacco use You need to work on decreasing your cigarettes.  We will try to help you if possible.  Ultimate goal will be to stop altogether.  Malignant neoplasm of lower lobe of right lung [facility]) Following with radiation oncology.  Repeat CT chest planned 6 months as above  Time spent 40 minutes  Levy Pupa, MD, PhD 04/01/2023, 11:23 AM [facility] [phone] or if no answer before 7:00PM call [phone] For any issues after 7:00PM please call eLink [phone]

## 2023-04-01 NOTE — Assessment & Plan Note (Signed)
 Following with radiation oncology.  Repeat CT chest planned 6 months as above

## 2023-04-23 ENCOUNTER — Other Ambulatory Visit: Payer: Self-pay | Admitting: Internal Medicine

## 2023-04-23 NOTE — Telephone Encounter (Signed)
 Patient following up with Dr. Drue Second. Refills sent

## 2023-04-23 NOTE — Telephone Encounter (Signed)
 [place] patient being reassigned to Dr. Thedore Mins. Routing to provider to advise on refills.   Linna Hoff, BSN, RN

## 2023-05-05 ENCOUNTER — Ambulatory Visit: Payer: Medicare Other | Admitting: Internal Medicine

## 2023-05-18 ENCOUNTER — Other Ambulatory Visit: Payer: Self-pay

## 2023-05-18 ENCOUNTER — Encounter: Payer: Self-pay | Admitting: Internal Medicine

## 2023-05-18 ENCOUNTER — Ambulatory Visit: Admitting: Internal Medicine

## 2023-05-18 VITALS — BP 130/81 | HR 82 | Temp 98.0°F | Ht 67.0 in | Wt 135.0 lb

## 2023-05-18 DIAGNOSIS — Z79899 Other long term (current) drug therapy: Secondary | ICD-10-CM | POA: Diagnosis not present

## 2023-05-18 DIAGNOSIS — A310 Pulmonary mycobacterial infection: Secondary | ICD-10-CM | POA: Diagnosis not present

## 2023-05-18 NOTE — Progress Notes (Signed)
 RFV: pulmonary mac  Patient ID: Kevin Brooks, male   DOB: 1945-03-17, 78 y.o.   MRN: [id number]  HPI 78yo M, He has a history of lung cancer s/p treatment SBRT and in August of 2024, had CT scan with new nodules noted in both lungs on CT with associated symptoms of decreased exercise tolerance, fatigue, productive, cough and some weight loss. His respiratory cultures showing MAI. He was seen by Dr. Seymour Dapper at that time who initiated 3 drug regimen of azithromycin , ethambutol  and rifabutin . three times per week regimen Feels worse for unclear reason -- decline in health from emphysema  - cost of rifabutin .. is too much of late. 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  Outpatient Encounter Medications as of 05/18/2023  Medication Sig   albuterol  (VENTOLIN  HFA) 108 (90 Base) MCG/ACT inhaler Inhale 2 puffs into the lungs every 6 (six) hours as needed for wheezing or shortness of breath.   azithromycin  (ZITHROMAX ) 250 MG tablet TAKE 1 TABLET BY MOUTH 3 TIMES WEEKLY   cholecalciferol  (VITAMIN D3) 25 MCG (1000 UNIT) tablet Take 1,000 Units by mouth daily.   citalopram  (CELEXA ) 40 MG tablet Take 40 mg by mouth daily.   clopidogrel  (PLAVIX ) 75 MG tablet Take 75 mg by mouth daily.   ethambutol  (MYAMBUTOL ) 400 MG tablet Take 4 tablets (1,600 mg total) by mouth 3 (three) times a week.   Fluticasone -Umeclidin-Vilant (TRELEGY ELLIPTA ) 100-62.5-25 MCG/INH AEPB Inhale 1 puff into the lungs daily.   losartan  (COZAAR ) 100 MG tablet Take 100 mg by mouth every morning.   omeprazole (PRILOSEC) 20 MG capsule Take 20 mg by mouth daily.   rifabutin  (MYCOBUTIN ) 150 MG capsule TAKE 2 CAPSULES(300 MG) BY MOUTH 3 TIMES A WEEK   rosuvastatin  (CRESTOR ) 5 MG tablet Take 5 mg by mouth daily.   tadalafil (CIALIS) 5 MG tablet Take 5 mg by mouth at bedtime.   tamsulosin  (FLOMAX ) 0.4 MG CAPS capsule Take  0.4 mg by mouth at bedtime.   No facility-administered encounter medications on file as of 05/18/2023.     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   Lung nodules 08/05/2022   Tobacco use 08/05/2022   Acute respiratory failure with hypoxia (HCC) 12/31/2021   Malignant neoplasm of lower lobe of right lung (HCC) 10/22/2020     Health Maintenance Due  Topic Date Due   Medicare Annual Wellness (AWV)  Never done   Hepatitis C Screening  Never done   DTaP/Tdap/Td (1 - Tdap) Never done   Pneumonia Vaccine 78+ Years old (1 of 2 - PCV) Never done   COVID-19 Vaccine (4 - 2024-25 season) 09/14/2022     Review of Systems 12 point ros is negative except what is mentioned above Physical Exam   BP 130/81   Pulse 82   Temp 98 F (36.7 C) (Oral)   Ht 5\' 7"  (1.702 m)   Wt 135 lb (61.2 kg)   SpO2 95%   BMI 21.14 kg/m   Physical Exam  Constitutional:  oriented to person, place, and time. appears well-developed and well-nourished. No distress.  HENT: [place]/AT, PERRLA, no scleral icterus Mouth/Throat: Oropharynx is clear and moist. No oropharyngeal exudate.  Cardiovascular: Normal rate, regular rhythm and normal heart sounds. Exam reveals no gallop and no  friction rub.  No murmur heard.  Pulmonary/Chest: Effort normal and breath sounds normal. No respiratory distress.  has no wheezes.  Lymphadenopathy: no cervical adenopathy. No axillary adenopathy Neurological: alert and oriented to person, place, and time.  Skin: Skin is warm and dry. No rash noted. No erythema.  Psychiatric: a normal mood and affect.  behavior is normal.     CBC Lab Results  Component Value Date   WBC 7.2 10/08/2022   RBC 4.73 10/08/2022   HGB 15.9 10/08/2022   HCT 46.9 10/08/2022   PLT 160 10/08/2022   MCV 99.2 10/08/2022   MCH 33.6 (H) 10/08/2022   MCHC 33.9  10/08/2022   RDW 11.8 10/08/2022   LYMPHSABS 0.7 12/30/2021   MONOABS 0.7 12/30/2021   EOSABS 0.0 12/30/2021    BMET Lab Results  Component Value Date   NA 135 01/01/2022   K 3.3 (L) 01/01/2022   CL 100 01/01/2022   CO2 26 01/01/2022   GLUCOSE 112 (H) 01/01/2022   BUN 13 01/01/2022   CREATININE 1.00 03/17/2023   CALCIUM  8.4 (L) 01/01/2022   GFRNONAA >60 01/01/2022      Assessment and Plan Pulmonary MAI Will continue three times per week medications Cost reduction rifabutin  Afb cutlure  to see if clearing sputum  Long term medication management = Cbc and cmp

## 2023-05-19 ENCOUNTER — Other Ambulatory Visit: Payer: Self-pay

## 2023-05-19 ENCOUNTER — Other Ambulatory Visit

## 2023-05-19 DIAGNOSIS — A310 Pulmonary mycobacterial infection: Secondary | ICD-10-CM

## 2023-05-19 LAB — CBC WITH DIFFERENTIAL/PLATELET
Absolute Lymphocytes: 2309 {cells}/uL (ref 850–3900)
Absolute Monocytes: 644 {cells}/uL (ref 200–950)
Basophils Absolute: 81 {cells}/uL (ref 0–200)
Basophils Relative: 1.1 %
Eosinophils Absolute: 81 {cells}/uL (ref 15–500)
Eosinophils Relative: 1.1 %
HCT: 49.2 % (ref 38.5–50.0)
Hemoglobin: 17.1 g/dL (ref 13.2–17.1)
MCH: 33.9 pg — ABNORMAL HIGH (ref 27.0–33.0)
MCHC: 34.8 g/dL (ref 32.0–36.0)
MCV: 97.4 fL (ref 80.0–100.0)
MPV: 12 fL (ref 7.5–12.5)
Monocytes Relative: 8.7 %
Neutro Abs: 4285 {cells}/uL (ref 1500–7800)
Neutrophils Relative %: 57.9 %
Platelets: 150 10*3/uL (ref 140–400)
RBC: 5.05 10*6/uL (ref 4.20–5.80)
RDW: 11.8 % (ref 11.0–15.0)
Total Lymphocyte: 31.2 %
WBC: 7.4 10*3/uL (ref 3.8–10.8)

## 2023-05-19 LAB — COMPREHENSIVE METABOLIC PANEL WITH GFR
AG Ratio: 2.1 (calc) (ref 1.0–2.5)
ALT: 17 U/L (ref 9–46)
AST: 20 U/L (ref 10–35)
Albumin: 4.7 g/dL (ref 3.6–5.1)
Alkaline phosphatase (APISO): 47 U/L (ref 35–144)
BUN: 13 mg/dL (ref 7–25)
CO2: 31 mmol/L (ref 20–32)
Calcium: 9.8 mg/dL (ref 8.6–10.3)
Chloride: 100 mmol/L (ref 98–110)
Creat: 0.85 mg/dL (ref 0.70–1.28)
Globulin: 2.2 g/dL (ref 1.9–3.7)
Glucose, Bld: 98 mg/dL (ref 65–99)
Potassium: 4.5 mmol/L (ref 3.5–5.3)
Sodium: 138 mmol/L (ref 135–146)
Total Bilirubin: 0.8 mg/dL (ref 0.2–1.2)
Total Protein: 6.9 g/dL (ref 6.1–8.1)
eGFR: 90 mL/min/{1.73_m2} (ref >=60)

## 2023-06-20 ENCOUNTER — Other Ambulatory Visit: Payer: Self-pay | Admitting: Internal Medicine

## 2023-06-20 DIAGNOSIS — A310 Pulmonary mycobacterial infection: Secondary | ICD-10-CM

## 2023-06-23 ENCOUNTER — Telehealth: Payer: Self-pay

## 2023-06-23 MED ORDER — ETHAMBUTOL HCL 400 MG PO TABS: 1600.0000 mg | ORAL_TABLET | ORAL | 5 refills | Status: DC

## 2023-06-23 NOTE — Telephone Encounter (Signed)
 No interactions between omeprazole and azithromycin /ethambutol /rifabutin . Ok to take together. Thanks!

## 2023-06-23 NOTE — Telephone Encounter (Signed)
 Patient called regarding results of sputum cultures, discussed that results are still preliminary, but as of right now there has not been any growth.   He also mentions that he saw his PCP yesterday who was wondering if there was any concern for interactions between omeprazole and any of his three-drug regimen.   Routing to pharmacy for advisement.   Takira Sherrin, BSN, RN

## 2023-06-23 NOTE — Telephone Encounter (Signed)
 Per Dr. Levern Reader, patient needs 6 months worth of rifabutin , ethambutol , and azithromycin . Also needs follow up. Message sent to front desk to assist with scheduling patient in Dr. Alva Auer next available.   Ion Gonnella, BSN, RN

## 2023-06-23 NOTE — Telephone Encounter (Signed)
 Called patient back, no answer. Left voicemail per patient request stating that there was no interaction with his regimen and omeprazole and to call back with any questions.   Kevin Brooks, BSN, RN

## 2023-07-02 LAB — MYCOBACTERIA,CULT W/FLUOROCHROME SMEAR
MICRO NUMBER:: 16424371
MICRO NUMBER:: 16424372
SMEAR:: NONE SEEN
SMEAR:: NONE SEEN
SPECIMEN QUALITY:: ADEQUATE
SPECIMEN QUALITY:: ADEQUATE

## 2023-07-08 IMAGING — DX DG CHEST 1V PORT
1 series · 1 of 1 positions shown · non-contrast
Comparison: January 12, 2019

CLINICAL DATA: Status post bronch in a 75-year-old male.

EXAM:
PORTABLE CHEST 1 VIEW

[chest ap]
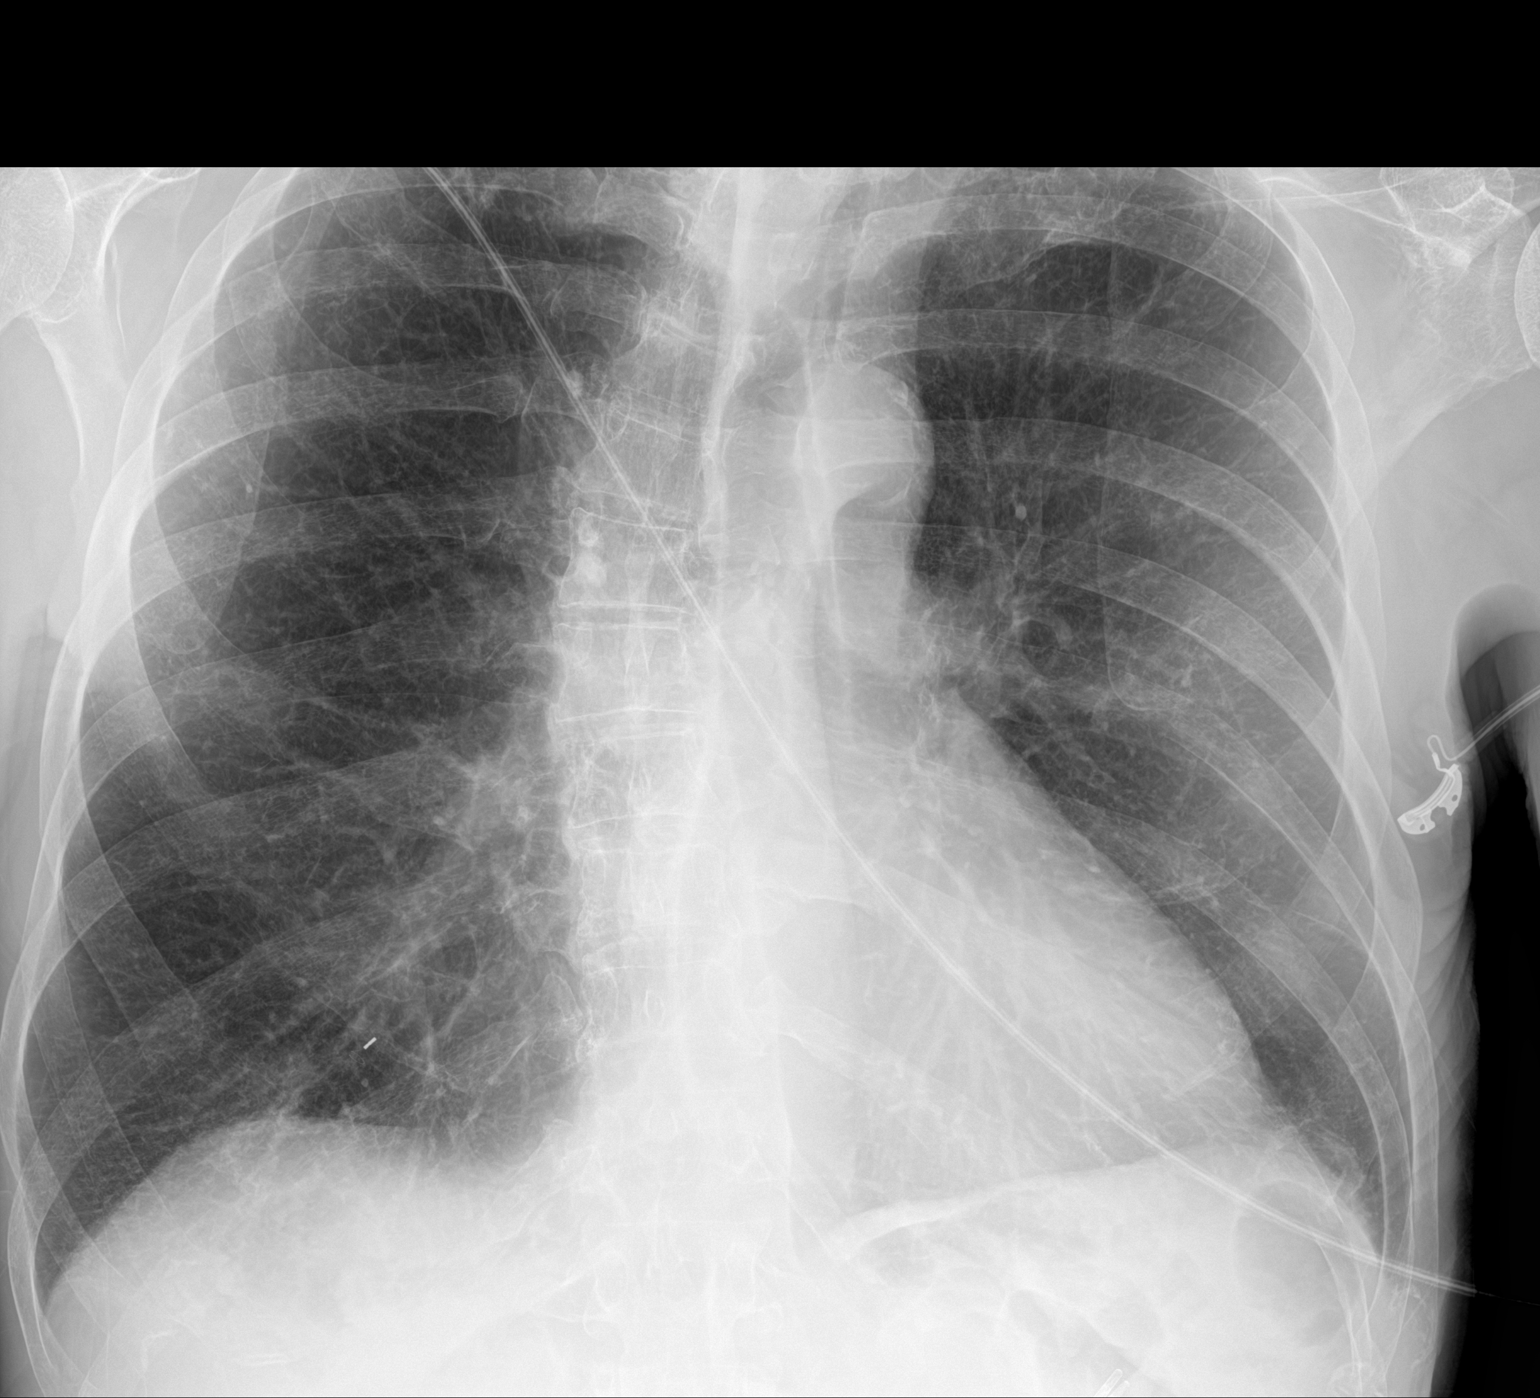

[1 of 1 positions shown; findings below may reference images not displayed]

FINDINGS: EKG leads project over the chest.

Image rotated the LEFT. Accounting for this cardiomediastinal
contours and hilar structures are stable. Small nodule in the RIGHT
lower lobe is not well visualized. There is a fiducial marker that
has been placed at the RIGHT lung base likely due interval
bronchoscopy. No lobar consolidative process.

No edema or pleural effusion.  No sign of pneumothorax.

On limited assessment there is no acute skeletal process.
IMPRESSION: Interval bronchoscopy and fiducial marker placement at the RIGHT
lung base. No pneumothorax or pleural effusion.

Small nodule in the RIGHT lower lobe is not well visualized.

## 2023-07-25 IMAGING — CT CT ABD-PELV W/ CM
2 of 5 series · 14 of 46 positions shown, 16 images · IV contrast (iopamidol)
Comparison: CT chest 11/09/2020, PET CT 09/26/2020, CT abdomen
pelvis 03/17/2012

CLINICAL DATA: Splenic mass, lung cancer.

EXAM:
CT ABDOMEN AND PELVIS WITH CONTRAST
TECHNIQUE: Multidetector CT imaging of the abdomen and pelvis was performed
using the standard protocol following bolus administration of
intravenous contrast.
CONTRAST:  100mL BC90E1-TYY IOPAMIDOL (BC90E1-TYY) INJECTION 61%

[Series 2: abd pelvis 5.00 br40 s3 axial · axial · 0.56mm/px · z∈[+1206,+1586]mm · 11 of 88 slices shown, 13 images]
[im 6/88  soft-tissue]
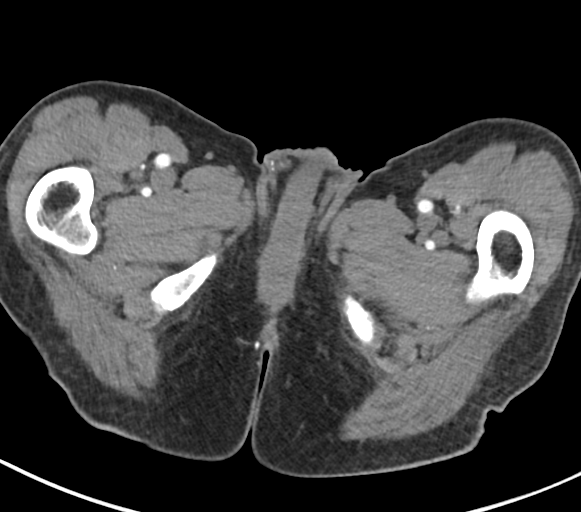
[im 6/88  bone]
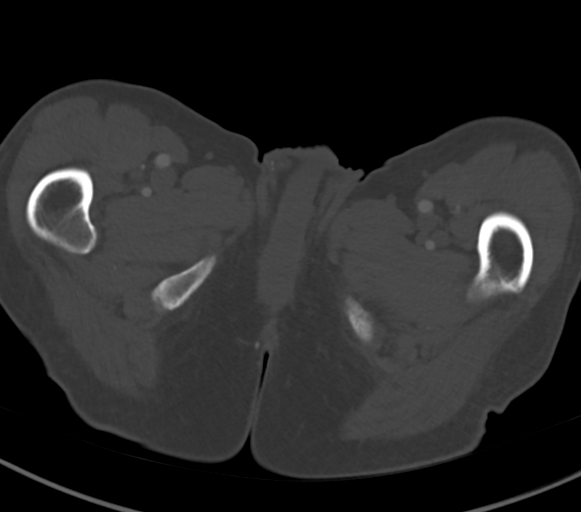
[im 17/88  soft-tissue]
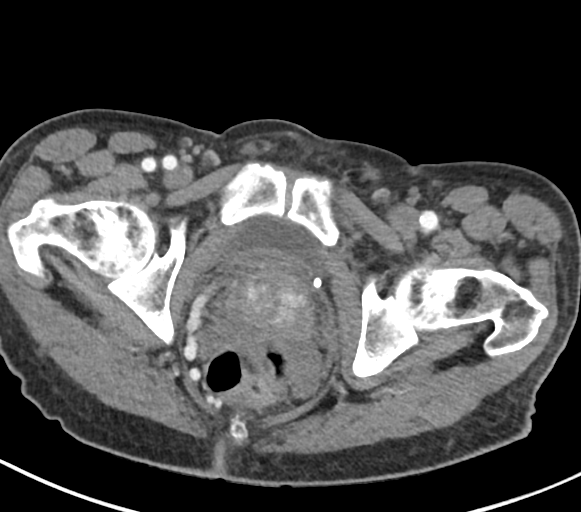
[im 22/88  soft-tissue]
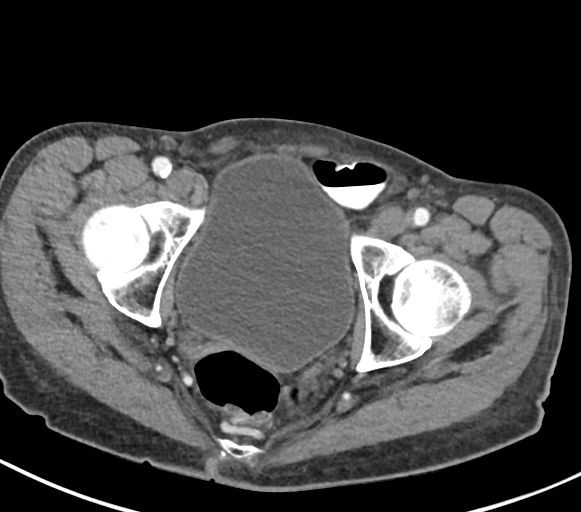
[im 28/88  soft-tissue]
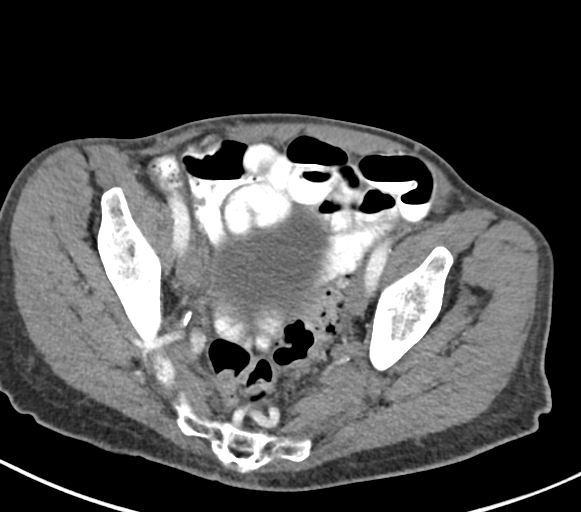
[im 39/88  soft-tissue]
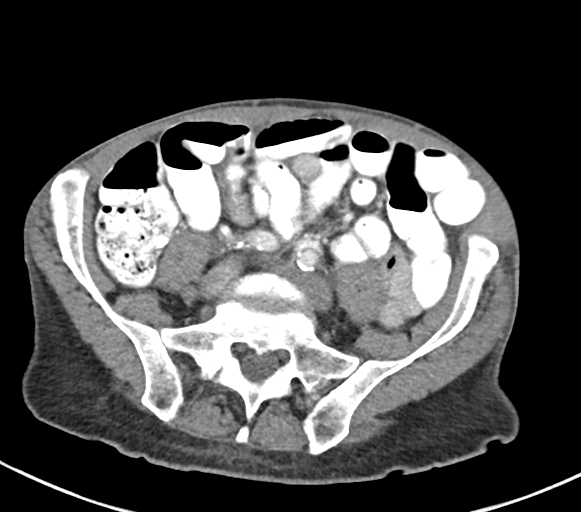
[im 44/88  soft-tissue]
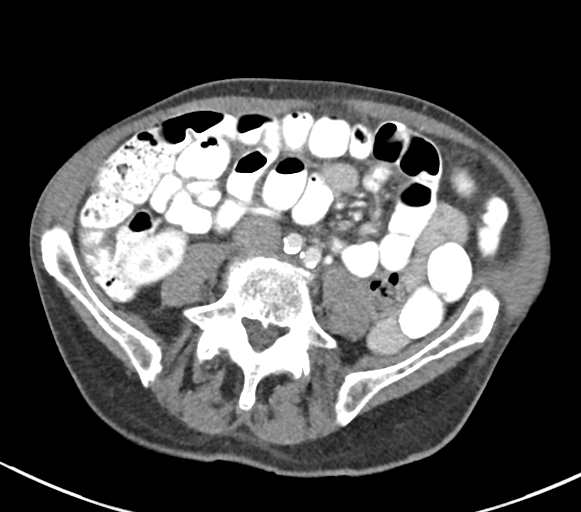
[im 49/88  soft-tissue]
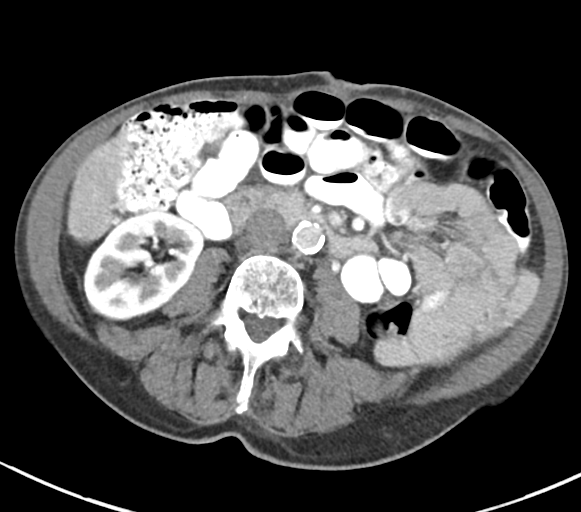
[im 60/88  soft-tissue]
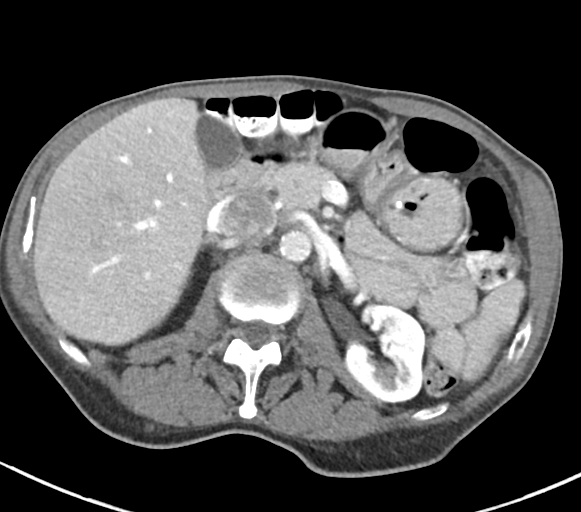
[im 66/88  soft-tissue]
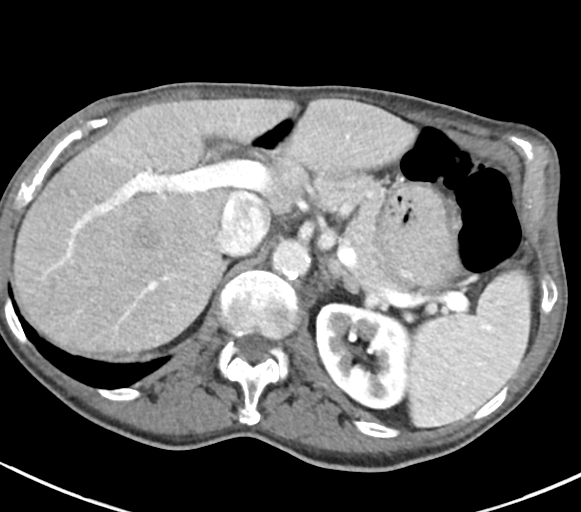
[im 66/88  bone]
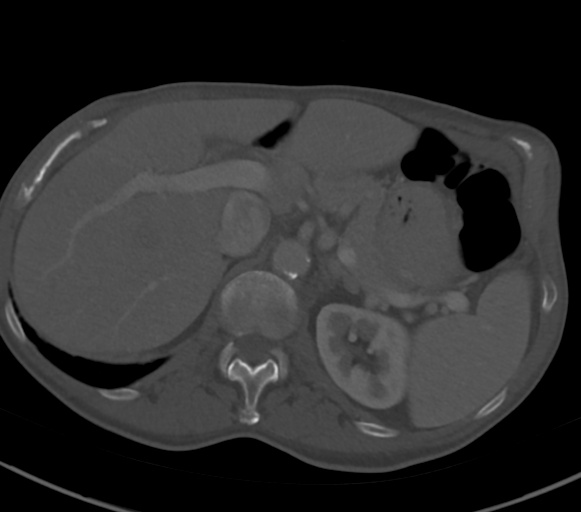
[im 71/88  soft-tissue]
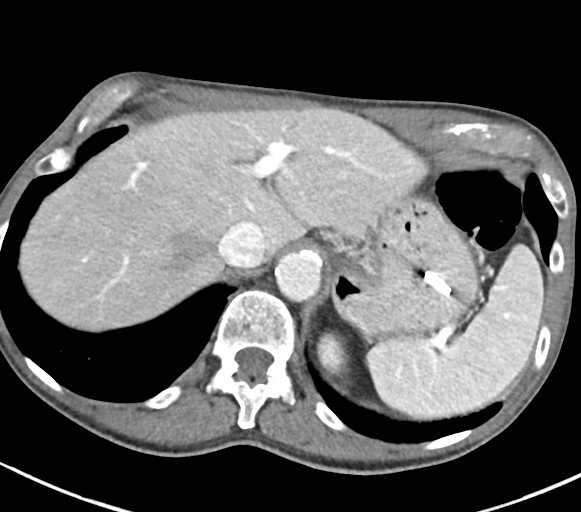
[im 82/88  soft-tissue]
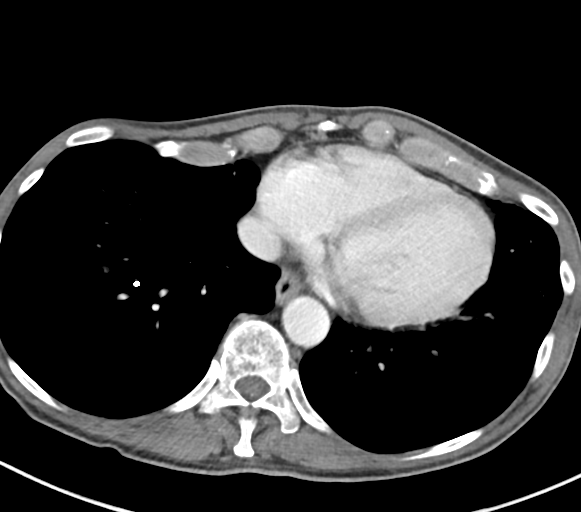

[Series 6: abd pelvis 2.00 br40 s3 cor · coronal · 0.73mm/px · 3 of 143 slices shown]
[im 48/143  soft-tissue]
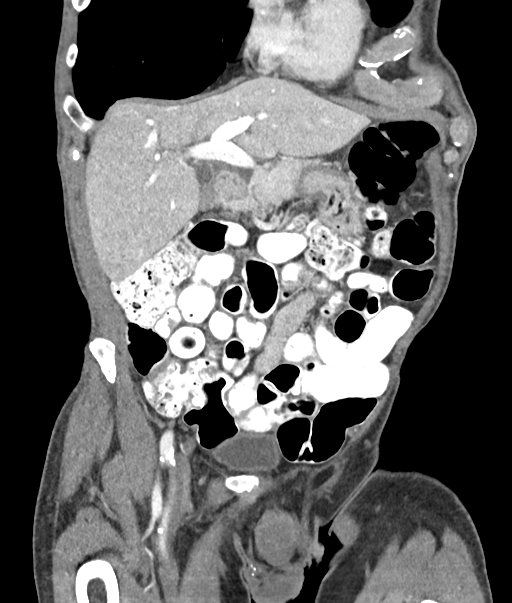
[im 64/143  soft-tissue]
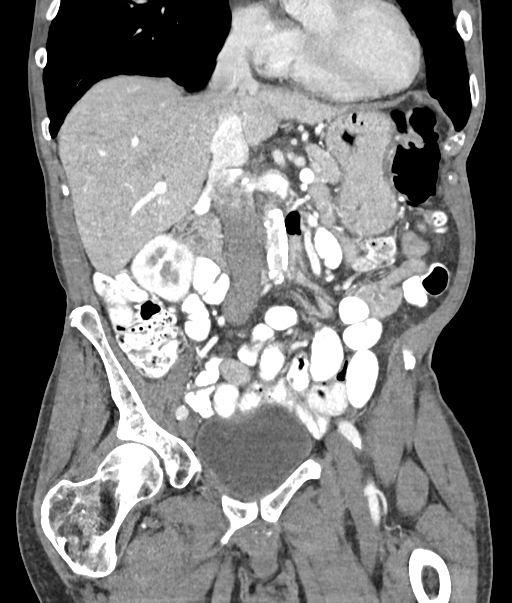
[im 79/143  soft-tissue]
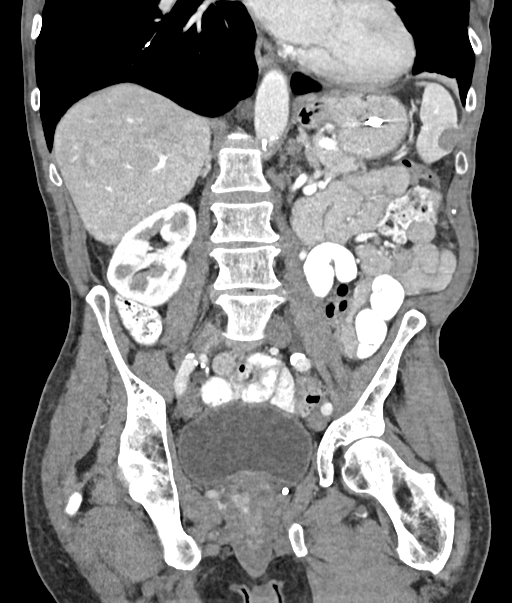

[14 of 46 positions shown; findings below may reference images not displayed]

FINDINGS: Lower chest: 16 mm pulmonary nodule within the right lower lobe is
stable. Moderate emphysema. Cardiac size within normal limits.

Hepatobiliary: No focal liver abnormality is seen. No gallstones,
gallbladder wall thickening, or biliary dilatation.

Pancreas: Unremarkable

Spleen: The hypermetabolic lesion within the anterior pole of the
spleen appears stable measuring 13 mm x 19 mm at axial image # [DATE].
No additional intra splenic lesions are identified. The spleen is of
normal size.

Adrenals/Urinary Tract: The adrenal glands are unremarkable. The
kidneys are normal. The bladder is unremarkable.

Stomach/Bowel: Severe sigmoid diverticulosis without superimposed
acute inflammatory change. Endoscopic clips are seen within gastric
fundus and distal body of the stomach. No free intraperitoneal gas
or fluid. The stomach, small bowel, and large bowel are otherwise
unremarkable.

Vascular/Lymphatic: Aortic atherosclerosis. Prominent rectal varices
noted within the right hemipelvis, unchanged from prior
examination. No enlarged abdominal or pelvic lymph nodes.

Reproductive: The prostate gland is mildly enlarged.

Other: No abdominal wall hernia.

Musculoskeletal: Sclerotic lesion within the sacrum is stable from
prior examination likely representing a a bone island. This was
metabolically negative on prior PET CT examination. Degenerative
changes are seen within the lumbar spine. No suspicious lytic or
blastic bone lesions are identified.
IMPRESSION: Stable 16 mm right lower lobe mixed solid and cystic minimally
spiculated nodule.

Emphysema.

Stable splenic mass, noted to be hypermetabolic on prior PET CT
examination. No additional evidence of metastatic disease within the
abdomen and pelvis.

Severe sigmoid diverticulosis.

Aortic Atherosclerosis (Q9I99-SIF.F).  Emphysema (Q9I99-100.I).

## 2023-08-12 ENCOUNTER — Encounter: Payer: Self-pay | Admitting: Internal Medicine

## 2023-08-12 ENCOUNTER — Other Ambulatory Visit: Payer: Self-pay

## 2023-08-12 ENCOUNTER — Ambulatory Visit: Admitting: Internal Medicine

## 2023-08-12 VITALS — BP 161/84 | HR 68 | Temp 97.5°F | Ht 67.0 in | Wt 135.0 lb

## 2023-08-12 DIAGNOSIS — A310 Pulmonary mycobacterial infection: Secondary | ICD-10-CM | POA: Diagnosis not present

## 2023-08-12 DIAGNOSIS — F17210 Nicotine dependence, cigarettes, uncomplicated: Secondary | ICD-10-CM | POA: Diagnosis not present

## 2023-08-12 DIAGNOSIS — Z79899 Other long term (current) drug therapy: Secondary | ICD-10-CM

## 2023-08-12 NOTE — Progress Notes (Signed)
 RFV: follow up for pulmonary mac  Patient ID: Kevin Brooks, male   DOB: 01-May-1945, 78 y.o.   MRN: [id number]  HPI Kevin Brooks is a 78yo M bronchiectasis, and pulmonary MAC, who has been on 3 drug treatment three times per week.  His cultured cleared negative back on 05/19/2023; He stopped his medications 2 weeks ago (completed abtx) -- through beginning of July since he thought he was done with taking his treatment course. He has not noticed any difference off of medications.  Still has chronic cough -  - but still smoking. No dyspnea on exertion   Outpatient Encounter Medications as of 08/12/2023  Medication Sig   albuterol  (VENTOLIN  HFA) 108 (90 Base) MCG/ACT inhaler Inhale 2 puffs into the lungs every 6 (six) hours as needed for wheezing or shortness of breath.   cholecalciferol  (VITAMIN D3) 25 MCG (1000 UNIT) tablet Take 1,000 Units by mouth daily.   citalopram  (CELEXA ) 40 MG tablet Take 40 mg by mouth daily.   clopidogrel  (PLAVIX ) 75 MG tablet Take 75 mg by mouth daily.   Fluticasone -Umeclidin-Vilant (TRELEGY ELLIPTA ) 100-62.5-25 MCG/INH AEPB Inhale 1 puff into the lungs daily.   losartan  (COZAAR ) 100 MG tablet Take 100 mg by mouth every morning.   omeprazole (PRILOSEC) 20 MG capsule Take 20 mg by mouth daily.   rosuvastatin  (CRESTOR ) 5 MG tablet Take 5 mg by mouth daily.   tadalafil (CIALIS) 5 MG tablet Take 5 mg by mouth at bedtime.   tamsulosin  (FLOMAX ) 0.4 MG CAPS capsule Take 0.4 mg by mouth at bedtime.   azithromycin  (ZITHROMAX ) 250 MG tablet TAKE 1 TABLET 3 TIMES A WEEK (Patient not taking: Reported on 08/12/2023)   ethambutol  (MYAMBUTOL ) 400 MG tablet Take 4 tablets (1,600 mg total) by mouth 3 (three) times a week. (Patient not taking: Reported on 08/12/2023)   rifabutin  (MYCOBUTIN ) 150 MG capsule TAKE 2 CAPSULES(300 MG) BY MOUTH 3 TIMES A WEEK (Patient not taking: Reported on 08/12/2023)   No facility-administered encounter medications on file as of 08/12/2023.     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 Productive cough in the morning mostly. Exercises 3 x per week. No change in exercise tolerance  Physical Exam   BP (!) 161/84   Pulse 68   Temp (!) 97.5 F (36.4 C) (Temporal)   Ht 5' 7 (1.702 m)   Wt 135 lb (61.2 kg)   SpO2 98%   BMI 21.14 kg/m   Physical Exam  Constitutional: He is oriented to person, place, and time. He appears well-developed and well-nourished. No distress.  HENT:  Mouth/Throat: Oropharynx is clear and moist. No oropharyngeal exudate.  Cardiovascular: Normal rate, regular rhythm and normal heart sounds. Exam reveals no gallop and no friction rub.  No murmur heard.  Pulmonary/Chest: Effort normal and breath sounds normal.  No respiratory distress. He has no wheezes.  Abdominal: Soft. Bowel sounds are normal. He exhibits no distension. There is no tenderness.  Lymphadenopathy:  He has no cervical adenopathy.  Neurological: He is alert and oriented to person, place, and time.  Skin: Skin is warm and dry. No rash noted. No erythema.  Psychiatric: He has a normal mood and affect. His behavior is normal.    CBC Lab Results  Component Value Date   WBC 7.4 05/18/2023   RBC 5.05 05/18/2023   HGB 17.1 05/18/2023   HCT 49.2 05/18/2023   PLT 150 05/18/2023   MCV 97.4 05/18/2023   MCH 33.9 (H)  05/18/2023   MCHC 34.8 05/18/2023   RDW 11.8 05/18/2023   LYMPHSABS 0.7 12/30/2021   MONOABS 0.7 12/30/2021   EOSABS 81 05/18/2023    BMET Lab Results  Component Value Date   NA 138 05/18/2023   K 4.5 05/18/2023   CL 100 05/18/2023   CO2 31 05/18/2023   GLUCOSE 98 05/18/2023   BUN 13 05/18/2023   CREATININE 0.85 05/18/2023   CALCIUM  9.8 05/18/2023   GFRNONAA >60 01/01/2022      Assessment and Plan Pulmonary MACI = plan to restart all 3 medications as three times per week and plan to Take meds through end of November.   Will give refills for medications.  Long term medication managements = denies any visual disturbances or other side effects of medication.  Smoking cessation counseling = recommend decrease smoking

## 2023-08-12 NOTE — Patient Instructions (Signed)
 Please take your abtx through the end of November.

## 2023-09-23 ENCOUNTER — Telehealth: Payer: Self-pay | Admitting: Radiation Oncology

## 2023-09-23 ENCOUNTER — Ambulatory Visit (HOSPITAL_BASED_OUTPATIENT_CLINIC_OR_DEPARTMENT_OTHER)
Admission: RE | Admit: 2023-09-23 | Discharge: 2023-09-23 | Disposition: A | Discharge status: Home / Self Care | Source: Ambulatory Visit | Attending: Radiation Oncology | Admitting: Radiation Oncology

## 2023-09-23 DIAGNOSIS — C3431 Malignant neoplasm of lower lobe, right bronchus or lung: Secondary | ICD-10-CM | POA: Insufficient documentation

## 2023-09-23 LAB — POCT I-STAT CREATININE: Creatinine, Ser: 1 mg/dL (ref 0.61–1.24)

## 2023-09-23 MED ORDER — IOHEXOL 300 MG/ML  SOLN
100.0000 mL | Freq: Once | INTRAMUSCULAR | Status: AC | PRN
  Administered 2023-09-23: 96 mL via INTRAVENOUS

## 2023-09-23 NOTE — Telephone Encounter (Signed)
 Pt called asking about 9/15 f/u @ 3:30pm. Pt advised he has an important meeting scheduled for 4:00pm same day. Pt was offered different day/time but he advised he would like to have this appt before seeing Dr. Shelah 9/17. He asked if it would be possible to have appt anytime before 3:30 on 9/15 (currently no open appt slots) since the rest of his day is open. I advised I would reach out to see if this would be possible and contact him once I hear from [facility]. Pt was grateful for this assistance and awaits c/b. Provider notified via inbasket.

## 2023-09-28 ENCOUNTER — Encounter: Payer: Self-pay | Admitting: Radiation Oncology

## 2023-09-28 ENCOUNTER — Ambulatory Visit
Admission: RE | Admit: 2023-09-28 | Discharge: 2023-09-28 | Disposition: A | Discharge status: Home / Self Care | Source: Ambulatory Visit | Attending: Radiation Oncology | Admitting: Radiation Oncology

## 2023-09-28 DIAGNOSIS — C3431 Malignant neoplasm of lower lobe, right bronchus or lung: Secondary | ICD-10-CM

## 2023-09-28 NOTE — Addendum Note (Signed)
 Encounter addended by: Lanell Donald Stagger, [name] on: 09/28/2023 10:58 AM  Actions taken: Level of Service modified, Follow-up modified
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
## 2023-09-28 NOTE — Progress Notes (Signed)
 Spoke with the patient to check in.  He continues to do well since completion of his radiation treatments in 2022.  He reports he is a little under the weather and has seen his provider and was prescribed antibiotics.  I let him know the PA would give him a call shortly to discuss his CT Chest from 09/23/2023.  He verbalized understanding and was appreciative of the call.  Sharene EDISON RN, BSN

## 2023-09-30 ENCOUNTER — Ambulatory Visit: Admitting: Emergency Medicine

## 2023-10-01 ENCOUNTER — Other Ambulatory Visit: Payer: Self-pay

## 2023-10-01 ENCOUNTER — Encounter: Payer: Self-pay | Admitting: Emergency Medicine

## 2023-10-01 ENCOUNTER — Ambulatory Visit: Admitting: Emergency Medicine

## 2023-10-01 VITALS — BP 132/88 | HR 79 | Ht 67.0 in | Wt 132.0 lb

## 2023-10-01 DIAGNOSIS — F17210 Nicotine dependence, cigarettes, uncomplicated: Secondary | ICD-10-CM

## 2023-10-01 DIAGNOSIS — C3431 Malignant neoplasm of lower lobe, right bronchus or lung: Secondary | ICD-10-CM | POA: Diagnosis not present

## 2023-10-01 DIAGNOSIS — J449 Chronic obstructive pulmonary disease, unspecified: Secondary | ICD-10-CM | POA: Diagnosis not present

## 2023-10-01 DIAGNOSIS — A310 Pulmonary mycobacterial infection: Secondary | ICD-10-CM | POA: Diagnosis not present

## 2023-10-01 DIAGNOSIS — Z720 Tobacco use: Secondary | ICD-10-CM

## 2023-10-01 NOTE — Assessment & Plan Note (Signed)
 Needs to work on cessation.  He understands this but is not ready to quit at this time.

## 2023-10-01 NOTE — Assessment & Plan Note (Signed)
 CT chest stable.  No progression of his known pulmonary nodules or his radiation site.  Plan to repeat his CT chest in March

## 2023-10-01 NOTE — Assessment & Plan Note (Signed)
 CT chest stable.  Clinically stable.  Plan with ID is for him to continue his Mycobacterium avium antibiotics until November and then stop.  We will then follow him clinically and with serial imaging.

## 2023-10-01 NOTE — Assessment & Plan Note (Addendum)
 Overall stable.  Did discuss with him in detail that he is at risk for flaring symptoms in the setting of his current URI.  So far does not have any evidence for an acute exacerbation.  Continue Trelegy and albuterol  as needed.  He will call me if he develops any signs of a flare.  Discussed with him that he needs to get the flu shot and the COVID-19 vaccine after his URI symptoms have resolved this fall.

## 2023-10-01 NOTE — Patient Instructions (Signed)
 We reviewed your CT scan of the chest today.  The pulmonary nodules are stable compared to priors.  Good news. Please get your repeat CT scan of the chest in March as planned by radiation oncology. Agree with plans to continue your antibiotics for Mycobacterium avium through November.  Continue to follow with ID as planned. Continue Trelegy 1 inhalation once daily.  Rinse and gargle after using. Keep your albuterol  available to use 2 puffs if needed for shortness of breath, chest tightness, wheezing. Get the flu shot and the COVID-19 vaccine this fall after your cold symptoms have resolved Please call us  if you do not continue to improve, if you develop any worsening respiratory symptoms, shortness of breath, cough, wheezing, etc. Follow Dr. Shelah in March to review your CT chest and your symptoms.

## 2023-10-01 NOTE — Progress Notes (Signed)
 Subjective:    Patient ID: Kevin Brooks, male    DOB: [birth year], 78 y.o.   MRN: [id number]  HPI  ROV 10/01/2023 --follow-up visit for 78 year old gentleman with a history of tobacco use and associated severe COPD (positive bronchodilator response), right lower lobe non-small cell lung cancer treated with SBRT (11/2020), Mycobacterium avium with associated waxing and waning pulmonary nodules on treatment since 08/2022. Managed on Trelegy and uses albuterol  approximately.  At his last visit we started a flutter valve. He had a negative sputum AFB this summer, plan is to continue [facility] abx thgru November.  Started to have URI sx about 5 days ago, did have sick contact. Was covid negative. He has taken OTC symptom relief. Has some cough, was bright yellow but starting to lighten up. No wheeze.   CT scan of the chest 09/23/2023 reviewed by me shows severe emphysema with diffuse bronchial wall thickening, unchanged posttreatment appearance of his right lower lobe mass lesion 4.6 x 2.0 cm, unchanged spiculated nodules in the bilateral upper lobes, calcified mediastinal and hilar adenopathy.   Review of Systems As per HPI  Past Medical History:  Diagnosis Date   BPH (benign prostatic hyperplasia)    Cataracta    COPD (chronic obstructive pulmonary disease) (HCC)    Depression    ED (erectile dysfunction)    GERD (gastroesophageal reflux disease)    Hypertension    Hypogonadism male    Lung cancer (HCC) 11/13/2020   Mixed hyperlipidemia    Palpitations    TIA (transient ischemic attack)    Vision abnormalities      Family History  Problem Relation Age of Onset   Stroke Father      Social History   Socioeconomic History   Marital status: Single    Spouse name: Not on file   Number of children: Not on file   Years of education: Not on file   Highest education level: Not on file  Occupational History   Not on file  Tobacco Use   Smoking status: Every Day    Current packs/day:  1.00    Types: Cigarettes    Passive exposure: Current   Smokeless tobacco: Never   Tobacco comments:    1 pack per day// not ready to quit 04/01/23  Vaping Use   Vaping status: Every Day  Substance and Sexual Activity   Alcohol use: Yes    Alcohol/week: 28.0 standard drinks of alcohol    Types: 28 Cans of beer per week    Comment: beer daily   Drug use: No   Sexual activity: Not Currently  Other Topics Concern   Not on file  Social History Narrative   Not on file   Social Drivers of Health   Financial Resource Strain: Not on file  Food Insecurity: No Food Insecurity (03/23/2023)   Hunger Vital Sign    Worried About Running Out of Food in the Last Year: Never true    Ran Out of Food in the Last Year: Never true  Transportation Needs: No Transportation Needs (03/23/2023)   PRAPARE - Administrator, Civil Service (Medical): No    Lack of Transportation (Non-Medical): No  Physical Activity: Not on file  Stress: Not on file  Social Connections: Not on file  Intimate Partner Violence: Not At Risk (03/23/2023)   Humiliation, Afraid, Rape, and Kick questionnaire    Fear of Current or Ex-Partner: No    Emotionally Abused: No    Physically Abused:  No    Sexually Abused: No     No Known Allergies   Outpatient Medications Prior to Visit  Medication Sig Dispense Refill   albuterol  (VENTOLIN  HFA) 108 (90 Base) MCG/ACT inhaler Inhale 2 puffs into the lungs every 6 (six) hours as needed for wheezing or shortness of breath.     cholecalciferol  (VITAMIN D3) 25 MCG (1000 UNIT) tablet Take 1,000 Units by mouth daily.     citalopram  (CELEXA ) 40 MG tablet Take 40 mg by mouth daily.     clopidogrel  (PLAVIX ) 75 MG tablet Take 75 mg by mouth daily.  1   Fluticasone -Umeclidin-Vilant (TRELEGY ELLIPTA ) 100-62.5-25 MCG/INH AEPB Inhale 1 puff into the lungs daily. 60 each 6   losartan  (COZAAR ) 100 MG tablet Take 100 mg by mouth every morning.     omeprazole (PRILOSEC) 20 MG capsule Take  20 mg by mouth daily.     rosuvastatin  (CRESTOR ) 5 MG tablet Take 5 mg by mouth daily.     tadalafil (CIALIS) 5 MG tablet Take 5 mg by mouth at bedtime.     tamsulosin  (FLOMAX ) 0.4 MG CAPS capsule Take 0.4 mg by mouth at bedtime.     No facility-administered medications prior to visit.        Objective:   Physical Exam Vitals:   10/01/23 1010  BP: 132/88  Pulse: 79  SpO2: 96%  Weight: 132 lb (59.9 kg)  Height: 5' 7 (1.702 m)   Gen: Pleasant, well-nourished, in no distress,  normal affect  ENT: No lesions,  mouth clear,  oropharynx clear, no postnasal drip  Neck: No JVD, no stridor  Lungs: No use of accessory muscles, expiratory squeak on the left, otherwise clear.  No wheezing or crackles  Cardiovascular: RRR, heart sounds normal, no murmur or gallops, no peripheral edema  Musculoskeletal: No deformities, no cyanosis or clubbing  Neuro: alert, awake, non focal  Skin: Warm, no lesions or rash      Assessment & Plan:  Tobacco use Needs to work on cessation.  He understands this but is not ready to quit at this time.  COPD (chronic obstructive pulmonary disease) (HCC) Overall stable.  Did discuss with him in detail that he is at risk for flaring symptoms in the setting of his current URI.  So far does not have any evidence for an acute exacerbation.  Continue Trelegy and albuterol  as needed.  He will call me if he develops any signs of a flare.  Discussed with him that he needs to get the flu shot and the COVID-19 vaccine after his URI symptoms have resolved this fall.  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  Clinically stable.  Plan with ID is for him to continue his Mycobacterium avium antibiotics until November and then stop.  We will then follow him clinically and with serial imaging.  Malignant neoplasm of lower lobe of right lung (HCC) CT chest stable.  No progression of his known pulmonary nodules or his radiation  site.  Plan to repeat his CT chest in March     Lamar Chris, MD, PhD 10/01/2023, 10:36 AM [facility] [phone] or if no answer before 7:00PM call [phone] For any issues after 7:00PM please call eLink [phone]

## 2023-10-02 ENCOUNTER — Telehealth (HOSPITAL_BASED_OUTPATIENT_CLINIC_OR_DEPARTMENT_OTHER): Payer: Self-pay

## 2023-10-02 MED ORDER — PREDNISONE 20 MG PO TABS: 20.0000 mg | ORAL_TABLET | Freq: Every day | ORAL | 0 refills | Status: DC

## 2023-10-02 NOTE — Telephone Encounter (Signed)
 He did ask about it,  but I did not prescribe prednisone  because based on his evaluation I did not think it was indicated.  I can give him a short course if he feels like it would be beneficial

## 2023-10-02 NOTE — Telephone Encounter (Signed)
 Copied from CRM #[id number]. Topic: Clinical - Medication Question >> Oct 02, 2023 10:34 AM Russell PARAS wrote: Reason for CRM:   Pt is contacting clinic regarding possible prednisone  course that he discussed with Byrum at his visit on 9/18. He spoke with Byrum about the current symptoms he is having and provider mentioned prescribing a course but when looking at his AVS, it is not mentioned.  His symptoms have not gotten worse, but he feels he is not improving either. Requesting if prednisone  could be sent to the [facility] on file.  CB#  [phone]

## 2023-10-05 NOTE — Telephone Encounter (Signed)
Pt is aware and has picked up rx.

## 2023-10-23 ENCOUNTER — Ambulatory Visit: Payer: Self-pay

## 2023-10-23 NOTE — Telephone Encounter (Signed)
 FYI Only or Action Required?: FYI only for provider.  Patient is followed in Pulmonology for [facility], last seen on 10/01/2023 by Shelah Lamar RAMAN, MD.  Called Nurse Triage reporting Shortness of Breath.  Symptoms began several weeks ago.  Interventions attempted: Rescue inhaler, Maintenance inhaler, Nebulizer treatments, and Increased fluids/rest.  Symptoms are: gradually improving.  Triage Disposition: See PCP Within 2 Weeks  Patient/caregiver understands and will follow disposition?: Yes  E2C2 Pulmonary Triage - Initial Assessment Questions "Chief Complaint (e.g., cough, sob, wheezing, fever, chills, sweat or additional symptoms) *Go to specific symptom protocol after initial questions. SOB  "How long have symptoms been present?" Weeks- prior to 9/18 appt with [facility]  Have you tested for COVID or Flu? Note: If not, ask patient if a home test can be taken. If so, instruct patient to call back for positive results. Yes- Negative  MEDICINES:   "Have you used any OTC meds to help with symptoms?" No If yes, ask "What medications?" na  "Have you used your inhalers/maintenance medication?" Yes If yes, "What medications?" Treligy daily in AM Albuterol  daily Rescue- occasionally  If inhaler, ask "How many puffs and how often?" Note: Review instructions on medication in the chart. Trelogy daily   OXYGEN: "Do you wear supplemental oxygen?" No If yes, "How many liters are you supposed to use?" na  "Do you monitor your oxygen levels?" Yes If yes, What is your reading (oxygen level) today? 96%  What is your usual oxygen saturation reading?  (Note: Pulmonary O2 sats should be 90% or greater) Mid to high 90's   Copied from CRM #[id number]. Topic: Clinical - Red Word Triage >> Oct 23, 2023 11:29 AM Nathanel DEL wrote: Red Word that prompted transfer to Nurse Triage: SOB - pt seen 9/18 and had bad cold, w/ deep cough.  Pt doesn't feel like he has fully recovered. Reason for  Disposition  [1] MILD longstanding difficulty breathing (e.g., minimal/no SOB at rest, SOB with walking, pulse < 100) AND [2] SAME as normal  Answer Assessment - Initial Assessment Questions Pt seen for URI on 9/18- Still recovering. SOB has decreased to only with activity. Denies dizziness, chest pain, or color changing. He is just concerned that he hasn't fully resolved. He is utilizing his Trelogy, albuterol , and rescue inhaler. He is unsure if he needs another appt but wants to be able to get his flu and covid vaccines. He understands he needs to wait until sx resolved. Appt made for next week but will call to cancel if feeling better over the weekend. ED/UC/Call back instructions given with understanding. Pt thankful for assistance  1. RESPIRATORY STATUS: Describe your breathing? (e.g., wheezing, shortness of breath, unable to speak, severe coughing)      Short of breath with activity  2. ONSET: When did this breathing problem begin?      Prior to 9/18 appt  3. PATTERN Does the difficult breathing come and go, or has it been constant since it started?      Worse w/ activity 4. SEVERITY: How bad is your breathing? (e.g., mild, moderate, severe)      Mild with activity 5. RECURRENT SYMPTOM: Have you had difficulty breathing before? If Yes, ask: When was the last time? and What happened that time?      Ongoing symptoms 6. CARDIAC HISTORY: Do you have any history of heart disease? (e.g., heart attack, angina, bypass surgery, angioplasty)      denies 7. LUNG HISTORY: Do you have any history of lung  disease?  (e.g., pulmonary embolus, asthma, emphysema)     Emphysema, COPD, lung nodules  8. CAUSE: What do you think is causing the breathing problem?      Still recovering from illness 9. OTHER SYMPTOMS: Do you have any other symptoms? (e.g., chest pain, cough, dizziness, fever, runny nose)     Denies  10. O2 SATURATION MONITOR:  Do you use an oxygen saturation monitor  (pulse oximeter) at home? If Yes, ask: What is your reading (oxygen level) today? What is your usual oxygen saturation reading? (e.g., 95%)       96% 12. TRAVEL: Have you traveled out of the country in the last month? (e.g., travel history, exposures)       denies  Protocols used: Breathing Difficulty-A-AH

## 2023-10-27 ENCOUNTER — Ambulatory Visit: Admitting: Internal Medicine

## 2023-11-20 ENCOUNTER — Other Ambulatory Visit (HOSPITAL_BASED_OUTPATIENT_CLINIC_OR_DEPARTMENT_OTHER): Payer: Self-pay

## 2023-11-20 MED ORDER — FLUZONE HIGH-DOSE 0.5 ML IM SUSY
0.5000 mL | PREFILLED_SYRINGE | Freq: Once | INTRAMUSCULAR | 0 refills | Status: AC
  Filled 2023-11-20: qty 0.5, 1d supply, fill #0

## 2023-12-01 ENCOUNTER — Other Ambulatory Visit (HOSPITAL_BASED_OUTPATIENT_CLINIC_OR_DEPARTMENT_OTHER): Payer: Self-pay

## 2023-12-01 MED ORDER — COMIRNATY 30 MCG/0.3ML IM SUSY
0.3000 mL | PREFILLED_SYRINGE | Freq: Once | INTRAMUSCULAR | 0 refills | Status: AC
  Filled 2023-12-01: qty 0.3, 1d supply, fill #0

## 2023-12-02 ENCOUNTER — Encounter: Payer: Self-pay | Admitting: Internal Medicine

## 2023-12-02 ENCOUNTER — Ambulatory Visit: Admitting: Internal Medicine

## 2023-12-02 ENCOUNTER — Other Ambulatory Visit: Payer: Self-pay

## 2023-12-02 VITALS — BP 155/74 | HR 72 | Temp 98.0°F | Wt 137.0 lb

## 2023-12-02 DIAGNOSIS — A310 Pulmonary mycobacterial infection: Secondary | ICD-10-CM | POA: Diagnosis not present

## 2023-12-02 DIAGNOSIS — I10 Essential (primary) hypertension: Secondary | ICD-10-CM

## 2023-12-02 NOTE — Progress Notes (Signed)
 RFV: pulmonary mac  Patient ID: Kevin Brooks, male   DOB: August 25, 1945, 78 y.o.   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   Finished 5 days of pred 20mg  -didn't notice much difference. Exercise tolerance is about the same m-w-f. Participates in exercise/aerobic activity Still has productive cough at her his baseline  This year going to stay around for holidays and then go to [place]  in February - for a week.   Outpatient Encounter Medications as of 12/02/2023  Medication Sig   albuterol  (VENTOLIN  HFA) 108 (90 Base) MCG/ACT inhaler Inhale 2 puffs into the lungs every 6 (six) hours as needed for wheezing or shortness of breath.   cholecalciferol  (VITAMIN D3) 25 MCG (1000 UNIT) tablet Take 1,000 Units by mouth daily.   citalopram  (CELEXA ) 40 MG tablet Take 40 mg by mouth daily.   clopidogrel  (PLAVIX ) 75 MG tablet Take 75 mg by mouth daily.   COVID-19 mRNA vaccine, Pfizer, (COMIRNATY ) syringe Inject 0.3 mLs into the muscle once for 1 dose.   Fluticasone -Umeclidin-Vilant (TRELEGY ELLIPTA ) 100-62.5-25 MCG/INH AEPB Inhale 1 puff into the lungs daily.   losartan  (COZAAR ) 100 MG tablet Take 100 mg by mouth every morning.   omeprazole (PRILOSEC) 20 MG capsule Take 20 mg by mouth daily.   rosuvastatin  (CRESTOR ) 5 MG tablet Take 5 mg by mouth daily.   tadalafil (CIALIS) 5 MG tablet Take 5 mg by mouth at bedtime.   tamsulosin  (FLOMAX ) 0.4 MG CAPS capsule Take 0.4 mg by mouth at bedtime.   predniSONE  (DELTASONE ) 20 MG tablet Take 1 tablet (20 mg total) by mouth daily with breakfast.   No facility-administered encounter medications on file as of 12/02/2023.     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   Lung nodules 08/05/2022   Tobacco use 08/05/2022   Acute respiratory failure with  hypoxia (HCC) 12/31/2021   Malignant neoplasm of lower lobe of right lung (HCC) 10/22/2020     Health Maintenance Due  Topic Date Due   Medicare Annual Wellness (AWV)  Never done   Hepatitis C Screening  Never done   DTaP/Tdap/Td (1 - Tdap) Never done   Pneumococcal Vaccine: 50+ Years (1 of 2 - PCV) Never done     Review of Systems 12 point ros is otherwise negative Physical Exam   BP (!) 170/81   Pulse 72   Temp 98 F (36.7 C) (Oral)   Wt 137 lb (62.1 kg)   SpO2 98%   BMI 21.46 kg/m    Physical Exam  Constitutional: He is oriented to person, place, and time. He appears well-developed and well-nourished. No distress.  HENT:  Mouth/Throat: Oropharynx is clear and moist. No oropharyngeal exudate.  Cardiovascular: Normal rate, regular rhythm and normal heart sounds. Exam reveals no gallop and no friction rub.  No murmur heard.  Pulmonary/Chest: Effort normal and breath sounds normal. No respiratory distress. He has no wheezes.  Abdominal: Soft. Bowel sounds are normal. He exhibits no distension. There is no tenderness.  Lymphadenopathy:  He has no cervical adenopathy.  Neurological: He is alert and oriented to person, place, and time.  Skin: Skin is warm and dry. No rash noted. No erythema.  Psychiatric: He has a normal mood and affect. His behavior is normal.    CBC Lab Results  Component Value Date  WBC 7.4 05/18/2023   RBC 5.05 05/18/2023   HGB 17.1 05/18/2023   HCT 49.2 05/18/2023   PLT 150 05/18/2023   MCV 97.4 05/18/2023   MCH 33.9 (H) 05/18/2023   MCHC 34.8 05/18/2023   RDW 11.8 05/18/2023   LYMPHSABS 0.7 12/30/2021   MONOABS 0.7 12/30/2021   EOSABS 81 05/18/2023    BMET Lab Results  Component Value Date   NA 138 05/18/2023   K 4.5 05/18/2023   CL 100 05/18/2023   CO2 31 05/18/2023   GLUCOSE 98 05/18/2023   BUN 13 05/18/2023   CREATININE 1.00 09/23/2023   CALCIUM  9.8 05/18/2023   GFRNONAA >60 01/01/2022      Assessment and  Plan  Pulmonary mac = will check sputum cx for AFB still see if can find MAC. Plan to finish treatment next week. See back in 2 months to see how he is doing off of meds  Htn = recheck  to see if still elevated. If so, then refer back to pcp  Health maintenance = up to date had covid and flu vaccine this season

## 2023-12-03 ENCOUNTER — Other Ambulatory Visit

## 2023-12-03 ENCOUNTER — Other Ambulatory Visit: Payer: Self-pay

## 2023-12-03 DIAGNOSIS — A310 Pulmonary mycobacterial infection: Secondary | ICD-10-CM

## 2023-12-03 NOTE — Telephone Encounter (Signed)
 Patient called, reports collection instructions he received said to collect 3 days of consecutive sputum but that it also says to bring sample in within 24 hours.  Discussed that if he was only given one collection cup then to just bring in one morning collection.   Patient verbalized understanding and has no further questions.   Kevin Brooks, BSN, RN

## 2023-12-23 IMAGING — CT CT CHEST SUPER D W/O CM
2 of 4 series · 14 of 36 positions shown, 17 images · non-contrast
Comparison: CT scan 01/31/2021

CLINICAL DATA: Follow-up pulmonary nodules.

EXAM:
CT CHEST WITHOUT CONTRAST
TECHNIQUE: Multidetector CT imaging of the chest was performed using thin slice
collimation for electromagnetic bronchoscopy planning purposes,
without intravenous contrast.
RADIATION DOSE REDUCTION: This exam was performed according to the
departmental dose-optimization program which includes automated
exposure control, adjustment of the mA and/or kV according to
patient size and/or use of iterative reconstruction technique.

[Series 5: lungs · axial · 0.79mm/px · z∈[+1300,+1624]mm · 11 of 182 slices shown, 14 images]
[im 10/182  mediastinal]
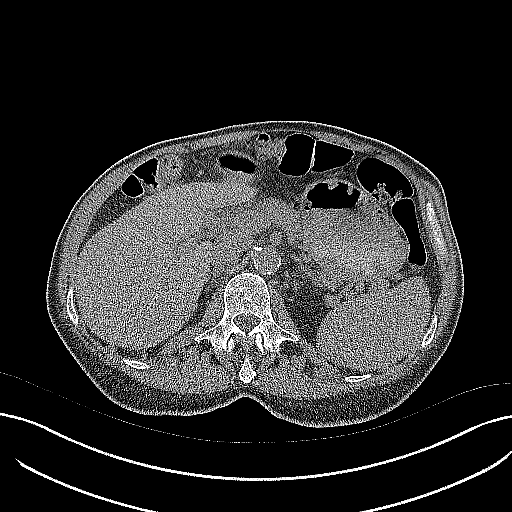
[im 10/182  lung]
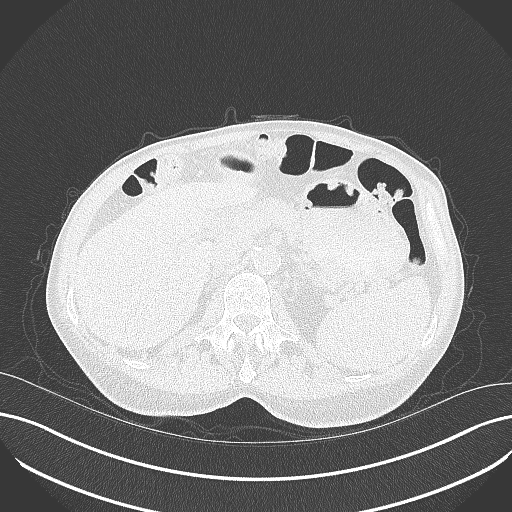
[im 29/182  lung]
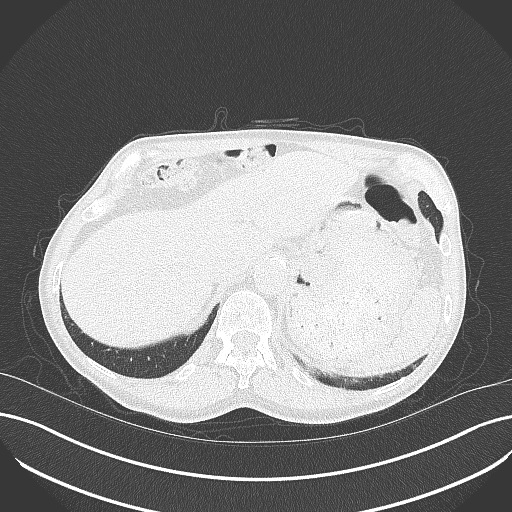
[im 48/182  lung]
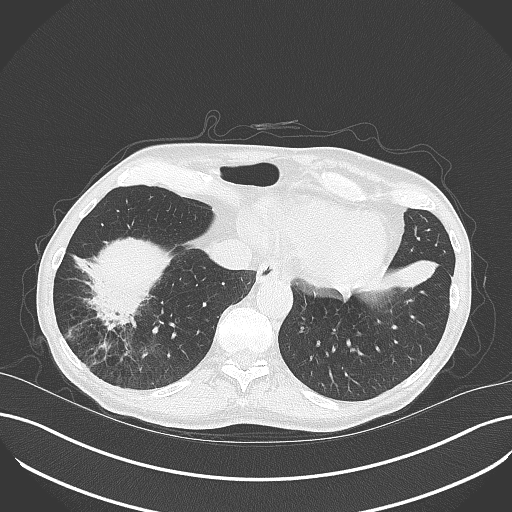
[im 58/182  lung]
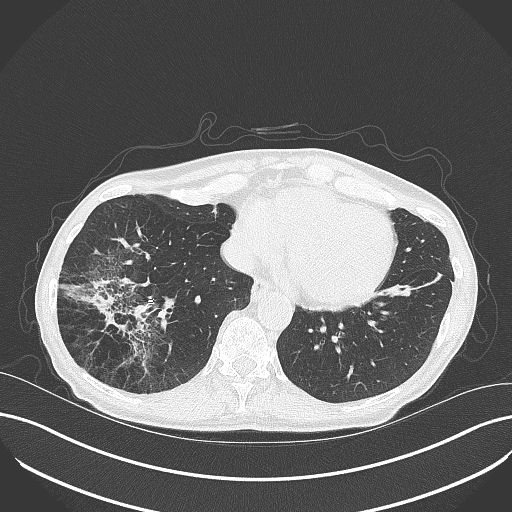
[im 77/182  mediastinal]
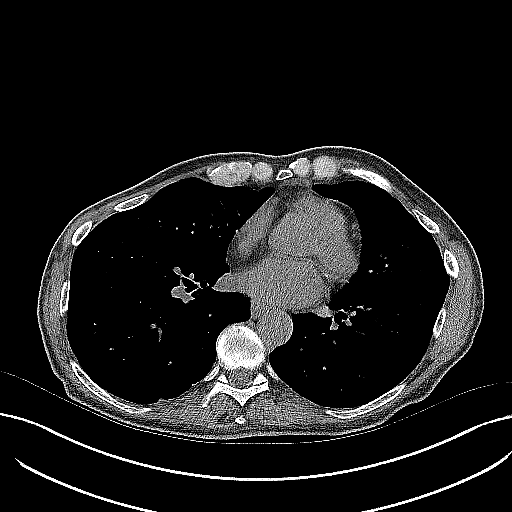
[im 77/182  lung]
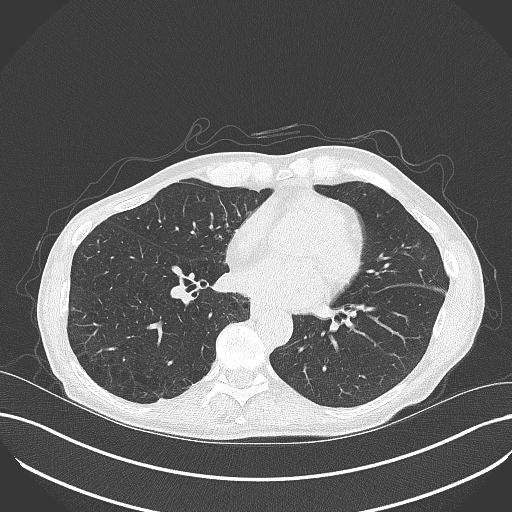
[im 96/182  lung]
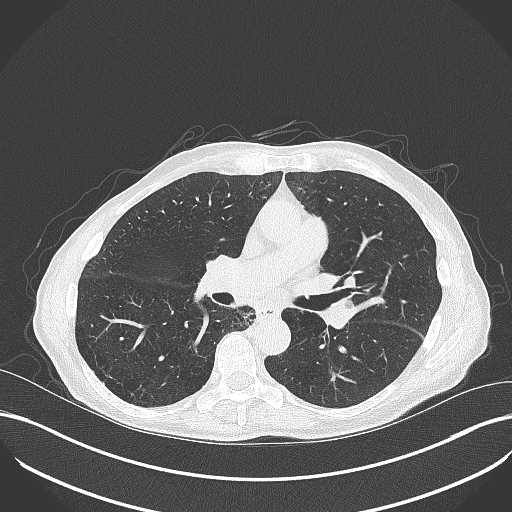
[im 105/182  lung]
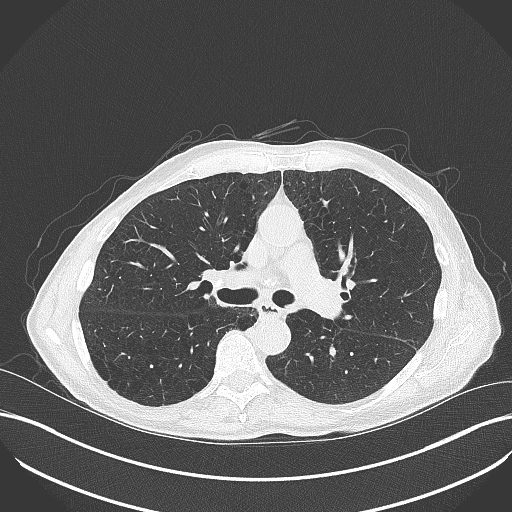
[im 124/182  lung]
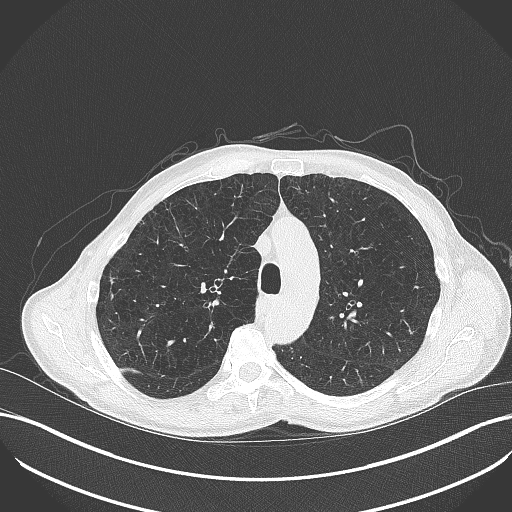
[im 134/182  mediastinal]
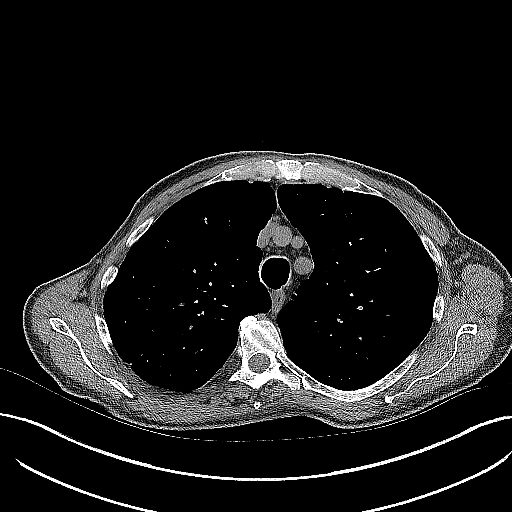
[im 134/182  lung]
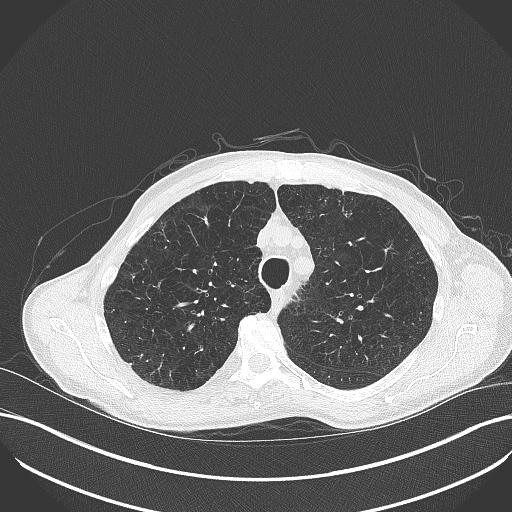
[im 153/182  lung]
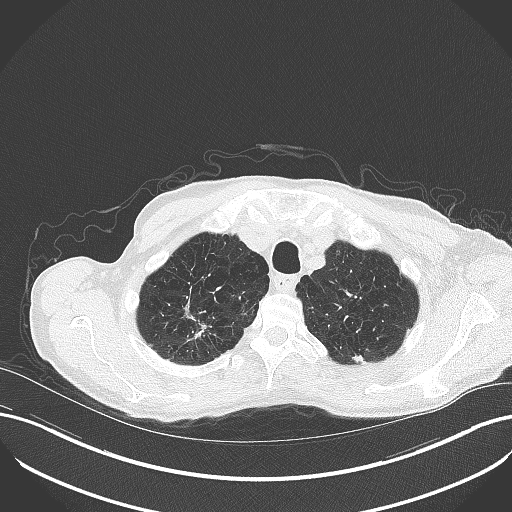
[im 172/182  lung]
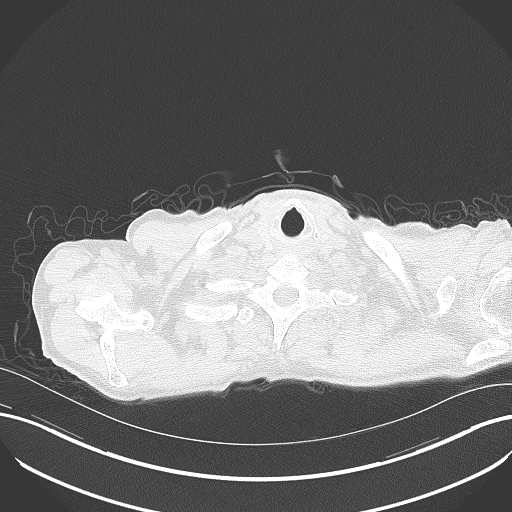

[Series 6: coronal · coronal · 0.71mm/px · 3 of 151 slices shown]
[im 31/151  lung]
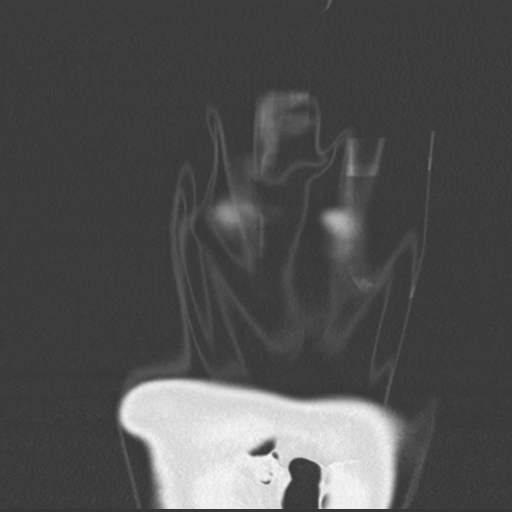
[im 61/151  lung]
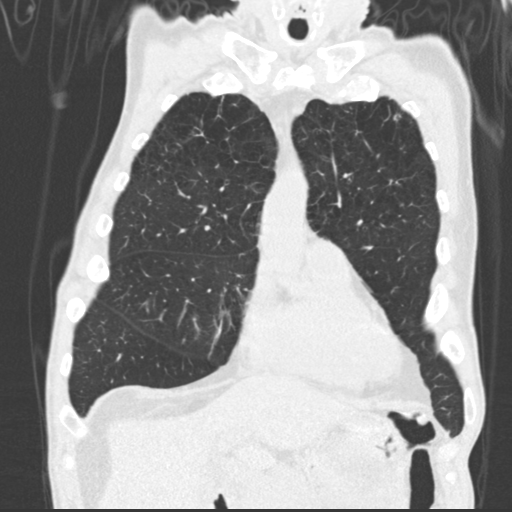
[im 91/151  lung]
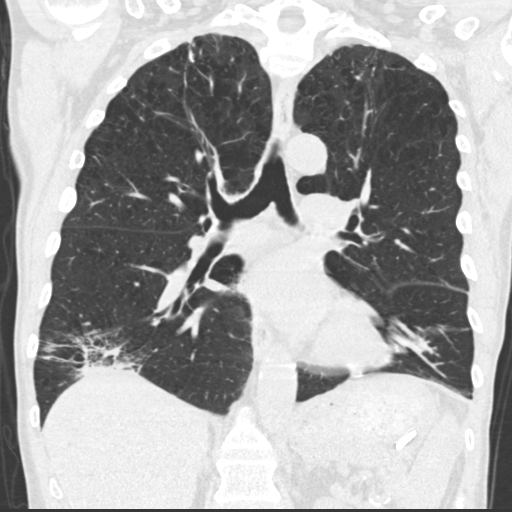

[14 of 36 positions shown; findings below may reference images not displayed]

FINDINGS: Cardiovascular: The heart is normal in size. No pericardial
effusion. The aorta is normal in caliber. Stable atherosclerotic
calcifications. Stable coronary artery calcifications.

Mediastinum/Nodes: Stable scattered mediastinal and hilar lymph
nodes some of which are calcified. No new or progressive findings.
The esophagus is grossly normal.

Lungs/Pleura: Stable significant emphysematous changes and pulmonary
scarring.

Stable nodular scarring changes in the right lung apex with
associated pleural and parenchymal calcification. Similar findings
at the left lung apex.

7 mm nodular lesion at the right lung apex on image [DATE] is stable.

Nodule at the left lung apex on image [DATE] measures 9.5 x 7 mm. It
previously measured 12.5 x 12 mm. This is likely an area of
resolving inflammation or contracting scar tissue.

Posterior left upper lobe nodular lesion on image 35/5 measures 12 x
11 mm. This previously measured 22 x 13 mm. This is also likely a
contracting area of scarring change or resolving inflammatory
process.

Stable 6.5 mm linear nodular density in the right upper lobe on
image number 50/5.

Stable right upper lobe calcified granuloma.

No new pulmonary lesions or pulmonary nodules.

Stable band of atelectasis or scarring in the anterior aspect of the
left lower lobe. The measured nodular area on the prior study
measured 16 x 11.5 mm. I do not see a discrete measurable nodule on
today's exam.

New patchy airspace opacity and interstitial thickening at the right
lung base most consistent an acute infectious process and could be
secondary to aspiration. Recommend appropriate treatment and
follow-up noncontrast chest CT in 3 months. No pleural effusions or
pleural nodules.

Upper Abdomen: No significant upper abdominal findings. Stable
advanced aortic and branch vessel calcifications.

Musculoskeletal: No chest wall mass, supraclavicular or axillary
adenopathy. The bony thorax is intact. Moderate pectus deformity
again noted. Stable scoliosis.
IMPRESSION: 1. New patchy airspace opacity and interstitial thickening at the
right lung base most consistent with an acute infectious process and
could be secondary to aspiration. Recommend appropriate treatment
and follow-up noncontrast chest CT in 3 months.
2. Interval decrease in size of the pulmonary lesions as detailed
above. These are likely areas of resolving inflammation or
contracting scar tissue. Recommend attention on future scans.
3. Stable emphysematous changes and pulmonary scarring.
4. Stable scattered mediastinal and hilar lymph nodes.
5. Stable advanced atherosclerotic calcifications involving the
aorta and branch vessels including the coronary arteries.

Aortic Atherosclerosis (4ZGDY-9QC.C) and Emphysema (4ZGDY-0FZ.J).

## 2024-01-18 LAB — MYCOBACTERIA,CULT W/FLUOROCHROME SMEAR
MICRO NUMBER:: 17267263
SMEAR:: NONE SEEN
SPECIMEN QUALITY:: ADEQUATE

## 2024-02-03 ENCOUNTER — Ambulatory Visit: Admitting: Internal Medicine

## 2024-02-05 ENCOUNTER — Ambulatory Visit: Admitting: Internal Medicine

## 2024-02-05 ENCOUNTER — Other Ambulatory Visit: Payer: Self-pay

## 2024-02-05 ENCOUNTER — Encounter: Payer: Self-pay | Admitting: Internal Medicine

## 2024-02-05 VITALS — BP 98/67 | HR 76 | Temp 98.1°F | Ht 67.0 in | Wt 135.0 lb

## 2024-02-05 DIAGNOSIS — A310 Pulmonary mycobacterial infection: Secondary | ICD-10-CM

## 2024-02-05 NOTE — Progress Notes (Signed)
 "      Patient ID: Kevin Brooks, male   DOB: 1945/11/03, 79 y.o.   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 Repeat cultures from 11/20 No AFB isolated.   Outpatient Encounter Medications as of 02/05/2024  Medication Sig   albuterol  (VENTOLIN  HFA) 108 (90 Base) MCG/ACT inhaler Inhale 2 puffs into the lungs every 6 (six) hours as needed for wheezing or shortness of breath.   cholecalciferol  (VITAMIN D3) 25 MCG (1000 UNIT) tablet Take 1,000 Units by mouth daily.   citalopram  (CELEXA ) 40 MG tablet Take 40 mg by mouth daily.   clopidogrel  (PLAVIX ) 75 MG tablet Take 75 mg by mouth daily.   Fluticasone -Umeclidin-Vilant (TRELEGY ELLIPTA ) 100-62.5-25 MCG/INH AEPB Inhale 1 puff into the lungs daily.   losartan  (COZAAR ) 100 MG tablet Take 100 mg by mouth every morning.   omeprazole (PRILOSEC) 20 MG capsule Take 20 mg by mouth daily.   rosuvastatin  (CRESTOR ) 5 MG tablet Take 5 mg by mouth daily.   tadalafil (CIALIS) 5 MG tablet Take 5 mg by mouth at bedtime.   tamsulosin  (FLOMAX ) 0.4 MG CAPS capsule Take 0.4 mg by mouth at bedtime.   predniSONE  (DELTASONE ) 20 MG tablet Take 1 tablet (20 mg total) by mouth daily with breakfast.   No facility-administered encounter medications on file as of 02/05/2024.     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   Lung nodules 08/05/2022   Tobacco use 08/05/2022   Acute respiratory failure with hypoxia (HCC) 12/31/2021   Malignant neoplasm of lower lobe of right lung (HCC) 10/22/2020     Health Maintenance Due  Topic Date Due   Medicare Annual Wellness (AWV)  Never done   Hepatitis C Screening   Never done   DTaP/Tdap/Td (1 - Tdap) Never done   Pneumococcal Vaccine: 50+ Years (1 of 2 - PCV) Never done     Review of Systems 12 point ros is otherwise negative  Physical Exam   BP 98/67   Pulse 76   Temp 98.1 F (36.7 C) (Temporal)   Ht 5' 7 (1.702 m)   Wt 135 lb (61.2 kg)   SpO2 97%   BMI 21.14 kg/m    Physical Exam  Constitutional: He is oriented to person, place, and time. He appears well-developed and well-nourished. No distress.  HENT:  Mouth/Throat: Oropharynx is clear and moist. No oropharyngeal exudate.  Cardiovascular: Normal rate, regular rhythm and normal heart sounds. Exam reveals no gallop and no friction rub.  No murmur heard.  Pulmonary/Chest: Effort normal and breath sounds normal. No respiratory distress. He has no wheezes.  Lymphadenopathy:  He has no cervical adenopathy.  Neurological: He is alert and oriented to person, place, and time.  Skin: Skin is warm and dry. No rash noted. No erythema.  Psychiatric: He has a normal mood and affect. His behavior is normal.    CBC Lab Results  Component Value Date   WBC 7.4 05/18/2023   RBC 5.05 05/18/2023   HGB 17.1 05/18/2023   HCT 49.2 05/18/2023   PLT 150 05/18/2023   MCV 97.4 05/18/2023   MCH 33.9 (H) 05/18/2023   MCHC 34.8 05/18/2023   RDW 11.8  05/18/2023   LYMPHSABS 0.7 12/30/2021   MONOABS 0.7 12/30/2021   EOSABS 81 05/18/2023    BMET Lab Results  Component Value Date   NA 138 05/18/2023   K 4.5 05/18/2023   CL 100 05/18/2023   CO2 31 05/18/2023   GLUCOSE 98 05/18/2023   BUN 13 05/18/2023   CREATININE 1.00 09/23/2023   CALCIUM  9.8 05/18/2023   GFRNONAA >60 01/01/2022    Assessment and Plan  History of pulmonary mac = completed treatment. Appears to be still stable since completion treatment.  History of bronchiectasis/COPD = continue with pulmonary hygiene  Rtc in 3-6 months for follow up   "

## 2024-02-13 ENCOUNTER — Emergency Department (HOSPITAL_COMMUNITY)

## 2024-02-13 ENCOUNTER — Encounter (HOSPITAL_COMMUNITY): Payer: Self-pay | Admitting: Internal Medicine

## 2024-02-13 ENCOUNTER — Other Ambulatory Visit: Payer: Self-pay

## 2024-02-13 ENCOUNTER — Observation Stay (HOSPITAL_COMMUNITY): Admission: EM | Admit: 2024-02-13 | Source: Home / Self Care | Attending: Family Medicine | Admitting: Family Medicine

## 2024-02-13 DIAGNOSIS — J9601 Acute respiratory failure with hypoxia: Secondary | ICD-10-CM

## 2024-02-13 DIAGNOSIS — J205 Acute bronchitis due to respiratory syncytial virus: Secondary | ICD-10-CM | POA: Diagnosis present

## 2024-02-13 DIAGNOSIS — J210 Acute bronchiolitis due to respiratory syncytial virus: Principal | ICD-10-CM

## 2024-02-13 DIAGNOSIS — J441 Chronic obstructive pulmonary disease with (acute) exacerbation: Secondary | ICD-10-CM | POA: Diagnosis present

## 2024-02-13 LAB — BASIC METABOLIC PANEL WITH GFR
Anion gap: 12 (ref 5–15)
BUN: 11 mg/dL (ref 8–23)
CO2: 25 mmol/L (ref 22–32)
Calcium: 9.5 mg/dL (ref 8.9–10.3)
Chloride: 99 mmol/L (ref 98–111)
Creatinine, Ser: 0.85 mg/dL (ref 0.61–1.24)
GFR, Estimated: >60 mL/min
Glucose, Bld: 119 mg/dL — ABNORMAL HIGH (ref 70–99)
Potassium: 3.9 mmol/L (ref 3.5–5.1)
Sodium: 136 mmol/L (ref 135–145)

## 2024-02-13 LAB — RESP PANEL BY RT-PCR (RSV, FLU A&B, COVID)  RVPGX2
Influenza A by PCR: NEGATIVE
Influenza B by PCR: NEGATIVE
Resp Syncytial Virus by PCR: POSITIVE — AB
SARS Coronavirus 2 by RT PCR: NEGATIVE

## 2024-02-13 LAB — CBC
HCT: 44.9 % (ref 39.0–52.0)
Hemoglobin: 16.1 g/dL (ref 13.0–17.0)
MCH: 34.6 pg — ABNORMAL HIGH (ref 26.0–34.0)
MCHC: 35.9 g/dL (ref 30.0–36.0)
MCV: 96.6 fL (ref 80.0–100.0)
Platelets: 141 10*3/uL — ABNORMAL LOW (ref 150–400)
RBC: 4.65 MIL/uL (ref 4.22–5.81)
RDW: 12.1 % (ref 11.5–15.5)
WBC: 7.7 10*3/uL (ref 4.0–10.5)
nRBC: 0 % (ref 0.0–0.2)

## 2024-02-13 LAB — BLOOD GAS, VENOUS
Acid-Base Excess: 4.7 mmol/L — ABNORMAL HIGH (ref 0.0–2.0)
Bicarbonate: 29.2 mmol/L — ABNORMAL HIGH (ref 20.0–28.0)
O2 Saturation: 91.5 %
Patient temperature: 37
pCO2, Ven: 42 mmHg — ABNORMAL LOW (ref 44–60)
pH, Ven: 7.45 — ABNORMAL HIGH (ref 7.25–7.43)
pO2, Ven: 58 mmHg — ABNORMAL HIGH (ref 32–45)

## 2024-02-13 LAB — TROPONIN T, HIGH SENSITIVITY
Troponin T High Sensitivity: 14 ng/L (ref 0–19)
Troponin T High Sensitivity: 14 ng/L (ref 0–19)

## 2024-02-13 MED ORDER — CLOPIDOGREL BISULFATE 75 MG PO TABS
75.0000 mg | ORAL_TABLET | ORAL | Status: AC
  Administered 2024-02-15 – 2024-02-19 (×3): 75 mg via ORAL
  Filled 2024-02-13 (×3): qty 1

## 2024-02-13 MED ORDER — MAGNESIUM SULFATE 2 GM/50ML IV SOLN
2.0000 g | Freq: Once | INTRAVENOUS | Status: AC
  Administered 2024-02-13: 2 g via INTRAVENOUS
  Filled 2024-02-13: qty 50

## 2024-02-13 MED ORDER — FOLIC ACID 1 MG PO TABS
1.0000 mg | ORAL_TABLET | Freq: Every day | ORAL | Status: DC
  Administered 2024-02-13 – 2024-02-14 (×2): 1 mg via ORAL
  Filled 2024-02-13 (×2): qty 1

## 2024-02-13 MED ORDER — METHYLPREDNISOLONE SODIUM SUCC 40 MG IJ SOLR
40.0000 mg | Freq: Every day | INTRAMUSCULAR | Status: DC
  Administered 2024-02-14: 40 mg via INTRAVENOUS
  Filled 2024-02-13: qty 1

## 2024-02-13 MED ORDER — ADULT MULTIVITAMIN W/MINERALS CH
1.0000 | ORAL_TABLET | Freq: Every day | ORAL | Status: DC
  Administered 2024-02-13 – 2024-02-14 (×2): 1 via ORAL
  Filled 2024-02-13 (×2): qty 1

## 2024-02-13 MED ORDER — TRAZODONE HCL 50 MG PO TABS
25.0000 mg | ORAL_TABLET | Freq: Every evening | ORAL | Status: AC | PRN
  Administered 2024-02-13 – 2024-02-18 (×4): 25 mg via ORAL
  Filled 2024-02-13 (×4): qty 1

## 2024-02-13 MED ORDER — ACETAMINOPHEN 325 MG PO TABS: 650.0000 mg | ORAL_TABLET | Freq: Four times a day (QID) | ORAL | Status: AC | PRN

## 2024-02-13 MED ORDER — ONDANSETRON HCL 4 MG/2ML IJ SOLN: 4.0000 mg | Freq: Four times a day (QID) | INTRAMUSCULAR | Status: AC | PRN

## 2024-02-13 MED ORDER — THIAMINE HCL 100 MG/ML IJ SOLN: 100.0000 mg | Freq: Every day | INTRAMUSCULAR | Status: DC

## 2024-02-13 MED ORDER — ACETAMINOPHEN 650 MG RE SUPP: 650.0000 mg | Freq: Four times a day (QID) | RECTAL | Status: AC | PRN

## 2024-02-13 MED ORDER — ONDANSETRON HCL 4 MG PO TABS: 4.0000 mg | ORAL_TABLET | Freq: Four times a day (QID) | ORAL | Status: AC | PRN

## 2024-02-13 MED ORDER — CITALOPRAM HYDROBROMIDE 20 MG PO TABS
40.0000 mg | ORAL_TABLET | Freq: Every day | ORAL | Status: AC
  Administered 2024-02-13 – 2024-02-19 (×7): 40 mg via ORAL
  Filled 2024-02-13 (×7): qty 2

## 2024-02-13 MED ORDER — ENOXAPARIN SODIUM 40 MG/0.4ML IJ SOSY
40.0000 mg | PREFILLED_SYRINGE | Freq: Every day | INTRAMUSCULAR | Status: AC
  Administered 2024-02-13 – 2024-02-19 (×7): 40 mg via SUBCUTANEOUS
  Filled 2024-02-13 (×7): qty 0.4

## 2024-02-13 MED ORDER — LORAZEPAM 2 MG/ML IJ SOLN
1.0000 mg | INTRAMUSCULAR | Status: DC | PRN
  Administered 2024-02-14 (×2): 1 mg via INTRAVENOUS
  Filled 2024-02-13 (×2): qty 1

## 2024-02-13 MED ORDER — THIAMINE MONONITRATE 100 MG PO TABS
100.0000 mg | ORAL_TABLET | Freq: Every day | ORAL | Status: DC
  Administered 2024-02-13 – 2024-02-14 (×2): 100 mg via ORAL
  Filled 2024-02-13 (×2): qty 1

## 2024-02-13 MED ORDER — METHYLPREDNISOLONE SODIUM SUCC 125 MG IJ SOLR
125.0000 mg | Freq: Every day | INTRAMUSCULAR | Status: DC
  Administered 2024-02-13: 125 mg via INTRAVENOUS
  Filled 2024-02-13: qty 2

## 2024-02-13 MED ORDER — CLOPIDOGREL BISULFATE 75 MG PO TABS: 75.0000 mg | ORAL_TABLET | Freq: Once | ORAL | Status: DC

## 2024-02-13 MED ORDER — ALBUTEROL SULFATE (2.5 MG/3ML) 0.083% IN NEBU
2.5000 mg | INHALATION_SOLUTION | RESPIRATORY_TRACT | Status: DC | PRN
  Administered 2024-02-14 (×3): 2.5 mg via RESPIRATORY_TRACT
  Filled 2024-02-13 (×3): qty 3

## 2024-02-13 MED ORDER — IPRATROPIUM-ALBUTEROL 0.5-2.5 (3) MG/3ML IN SOLN
3.0000 mL | Freq: Once | RESPIRATORY_TRACT | Status: AC
  Administered 2024-02-13: 3 mL via RESPIRATORY_TRACT
  Filled 2024-02-13: qty 3

## 2024-02-13 MED ORDER — ROSUVASTATIN CALCIUM 10 MG PO TABS
5.0000 mg | ORAL_TABLET | Freq: Every day | ORAL | Status: DC
  Administered 2024-02-13 – 2024-02-14 (×2): 5 mg via ORAL
  Filled 2024-02-13 (×3): qty 1

## 2024-02-13 MED ORDER — LORAZEPAM 1 MG PO TABS: 1.0000 mg | ORAL_TABLET | ORAL | Status: DC | PRN

## 2024-02-13 MED ORDER — IPRATROPIUM-ALBUTEROL 0.5-2.5 (3) MG/3ML IN SOLN
3.0000 mL | Freq: Four times a day (QID) | RESPIRATORY_TRACT | Status: DC
  Administered 2024-02-13 – 2024-02-14 (×5): 3 mL via RESPIRATORY_TRACT
  Filled 2024-02-13 (×5): qty 3

## 2024-02-13 MED ORDER — PANTOPRAZOLE SODIUM 40 MG PO TBEC
40.0000 mg | DELAYED_RELEASE_TABLET | Freq: Every day | ORAL | Status: AC
  Administered 2024-02-13 – 2024-02-19 (×7): 40 mg via ORAL
  Filled 2024-02-13 (×7): qty 1

## 2024-02-13 NOTE — H&P (Signed)
 " History and Physical  Kevin Brooks FMW:[id number] DOB: [birth year] DOA: 02/13/2024  PCP: Windy Coy, MD   Chief Complaint: Wheezing, cough  HPI: Kevin Brooks is a 79 y.o. male with medical history significant for COPD on room air, hypertension, GERD, ongoing tobacco abuse and daily alcohol use being admitted to the hospital with acute hypoxic respiratory failure in the setting of RSV infection.  Patient states that starting about 2 days ago, he was feeling generally unwell, started to develop a productive cough, wheezing.  No sick contacts that he is aware of.  Came to the emergency department for evaluation, was found to be hypoxic to 88% and placed on 2 L nasal cannula oxygen.  Workup as detailed below revealed RSV infection.  He was given steroids, magnesium  and admission was requested.  Review of Systems: Please see HPI for pertinent positives and negatives. A complete 10 system review of systems are otherwise negative.  Past Medical History:  Diagnosis Date   BPH (benign prostatic hyperplasia)    Cataracta    COPD (chronic obstructive pulmonary disease) (HCC)    Depression    ED (erectile dysfunction)    GERD (gastroesophageal reflux disease)    Hypertension    Hypogonadism male    Lung cancer (HCC) 11/13/2020   Mixed hyperlipidemia    Palpitations    TIA (transient ischemic attack)    Vision abnormalities    Past Surgical History:  Procedure Laterality Date    catarcts surgery     BRONCHIAL BIOPSY  11/13/2020   Procedure: BRONCHIAL BIOPSIES;  Surgeon: Brenna Adine CROME, DO;  Location: [facility];  Service: Pulmonary;;   BRONCHIAL BRUSHINGS  11/13/2020   Procedure: BRONCHIAL BRUSHINGS;  Surgeon: Brenna Adine CROME, DO;  Location: [facility];  Service: Pulmonary;;   BRONCHIAL NEEDLE ASPIRATION BIOPSY  11/13/2020   Procedure: BRONCHIAL NEEDLE ASPIRATION BIOPSIES;  Surgeon: Brenna Adine CROME, DO;  Location: [facility];  Service: Pulmonary;;   COLONOSCOPY      COLONOSCOPY WITH PROPOFOL  N/A 03/16/2020   Procedure: COLONOSCOPY WITH PROPOFOL ;  Surgeon: Rollin Dover, MD;  Location: [facility];  Service: Endoscopy;  Laterality: N/A;   ENTEROSCOPY N/A 03/16/2020   Procedure: ENTEROSCOPY;  Surgeon: Rollin Dover, MD;  Location: [facility];  Service: Endoscopy;  Laterality: N/A;   HEMOSTASIS CLIP PLACEMENT  03/16/2020   Procedure: HEMOSTASIS CLIP PLACEMENT;  Surgeon: Rollin Dover, MD;  Location: [facility];  Service: Endoscopy;;   HOT HEMOSTASIS N/A 03/16/2020   Procedure: HOT HEMOSTASIS (ARGON PLASMA COAGULATION/BICAP);  Surgeon: Rollin Dover, MD;  Location: [facility];  Service: Endoscopy;  Laterality: N/A;   POLYPECTOMY  03/16/2020   Procedure: POLYPECTOMY;  Surgeon: Rollin Dover, MD;  Location: [facility];  Service: Endoscopy;;   VIDEO BRONCHOSCOPY WITH ENDOBRONCHIAL NAVIGATION Right 11/13/2020   Procedure: VIDEO BRONCHOSCOPY WITH ENDOBRONCHIAL NAVIGATION;  Surgeon: Brenna Adine CROME, DO;  Location: [facility];  Service: Pulmonary;  Laterality: Right;  ION w/ fiducial placement   VIDEO BRONCHOSCOPY WITH RADIAL ENDOBRONCHIAL ULTRASOUND  11/13/2020   Procedure: RADIAL ENDOBRONCHIAL ULTRASOUND;  Surgeon: Brenna Adine CROME, DO;  Location: [facility];  Service: Pulmonary;;   Social History:  reports that he has been smoking cigarettes. He has been exposed to tobacco smoke. He has never used smokeless tobacco. He reports current alcohol use of about 28.0 standard drinks of alcohol per week. He reports that he does not use drugs.  Allergies[1]  Family History  Problem Relation Age of Onset   Stroke Father  Prior to Admission medications  Medication Sig Start Date End Date Taking? Authorizing Provider  albuterol  (VENTOLIN  HFA) 108 (90 Base) MCG/ACT inhaler Inhale 2 puffs into the lungs every 6 (six) hours as needed for wheezing or shortness of breath.    [provider]  cholecalciferol  (VITAMIN D3) 25 MCG (1000 UNIT) tablet Take 1,000  Units by mouth daily.    [provider]  citalopram  (CELEXA ) 40 MG tablet Take 40 mg by mouth daily. 08/18/11   [provider]  clopidogrel  (PLAVIX ) 75 MG tablet Take 75 mg by mouth daily. 11/15/16   [provider]  Fluticasone -Umeclidin-Vilant (TRELEGY ELLIPTA ) 100-62.5-25 MCG/INH AEPB Inhale 1 puff into the lungs daily. 10/22/20   Brenna Adine CROME, DO  losartan  (COZAAR ) 100 MG tablet Take 100 mg by mouth every morning. 02/12/19   [provider]  omeprazole (PRILOSEC) 20 MG capsule Take 20 mg by mouth daily. 03/12/19   [provider]  predniSONE  (DELTASONE ) 20 MG tablet Take 1 tablet (20 mg total) by mouth daily with breakfast. 10/02/23   Shelah Lamar RAMAN, MD  rosuvastatin  (CRESTOR ) 5 MG tablet Take 5 mg by mouth daily.    [provider]  tadalafil (CIALIS) 5 MG tablet Take 5 mg by mouth at bedtime.    [provider]  tamsulosin  (FLOMAX ) 0.4 MG CAPS capsule Take 0.4 mg by mouth at bedtime. 01/01/19   [provider]    Physical Exam: BP (!) 155/79   Pulse 96   Temp (!) 97.5 F (36.4 C) (Oral)   Resp 18   SpO2 96%  General:  Alert, oriented, calm, in no acute distress, resting comfortably on 2 L nasal cannula oxygen.  No cough or respiratory distress. Cardiovascular: RRR, no murmurs or rubs, no peripheral edema  Respiratory: Diminished breath sounds bilaterally, with diffuse rhonchi.  No tachypnea, retractions, stridor or active wheezing. Abdomen: soft, nontender, nondistended, normal bowel tones heard  Skin: dry, no rashes  Musculoskeletal: no joint effusions, normal range of motion  Psychiatric: appropriate affect, normal speech  Neurologic: extraocular muscles intact, clear speech, moving all extremities with intact sensorium         Labs on Admission:  Basic Metabolic Panel: Recent Labs  Lab 02/13/24 0500  NA 136  K 3.9  CL 99  CO2 25  GLUCOSE 119*  BUN 11  CREATININE 0.85  CALCIUM  9.5   Liver  Function Tests: No results for input(s): AST, ALT, ALKPHOS, BILITOT, PROT, ALBUMIN in the last 168 hours. No results for input(s): LIPASE, AMYLASE in the last 168 hours. No results for input(s): AMMONIA in the last 168 hours. CBC: Recent Labs  Lab 02/13/24 0500  WBC 7.7  HGB 16.1  HCT 44.9  MCV 96.6  PLT 141*   Cardiac Enzymes: No results for input(s): CKTOTAL, CKMB, CKMBINDEX, TROPONINI in the last 168 hours. BNP (last 3 results) No results for input(s): BNP in the last 8760 hours.  ProBNP (last 3 results) No results for input(s): PROBNP in the last 8760 hours.  CBG: No results for input(s): GLUCAP in the last 168 hours.  Radiological Exams on Admission: DG Chest 2 View Result Date: 02/13/2024 EXAM: 2 VIEW(S) XRAY OF THE CHEST 02/13/2024 04:52:00 AM COMPARISON: 08/05/2022 and CT chest from 09/23/2023. CLINICAL HISTORY: Shortness of breath. FINDINGS: LUNGS AND PLEURA: Spiculated density in the right lower lobe with adjacent fiducial marker measuring 1.9 cm, reflecting post-treatment changes for right lower lung mass. Linear opacities in the right lung base compatible with  scarring. Emphysematous changes. T No pleural effusion. No pneumothorax. HEART AND MEDIASTINUM: Atherosclerotic calcifications. No acute abnormality of the cardiac and mediastinal silhouettes. BONES AND SOFT TISSUES: No acute osseous abnormality. IMPRESSION: 1. Emphysematous changes, which can contribute to shortness of breath. 2. Spiculated 1.9 cm right lower lobe opacity with adjacent fiducial marker, favored post-treatment change. 3. Right basilar linear scarring. Electronically signed by: Waddell Calk MD 02/13/2024 07:12 AM EST RP Workstation: [id number]   Assessment/Plan Kevin Brooks is a 79 y.o. male with medical history significant for COPD on room air, hypertension, GERD, ongoing tobacco abuse and daily alcohol use being admitted to the hospital with acute hypoxic respiratory  failure in the setting of RSV infection.   Acute exacerbation of COPD-with hypoxia, cough, wheezing.  Likely due to RSV infection. -Observation admission -Continue supplemental oxygen -Scheduled DuoNebs, as needed albuterol  nebulizer -Continue supplemental oxygen, wean as tolerated -IV Solu-Medrol  daily  History of TIA-continue Plavix  3 times weekly  GERD-omeprazole  Depression-citalopram   Hyperlipidemia-rosuvastatin   DVT prophylaxis: Lovenox      Code Status: Full Code  Consults called: None  Admission status: Observation  Time spent: 48 minutes  Kevin Brooks CHRISTELLA Gail MD [facility] Pager [phone]  If 7PM-7AM, please contact night-coverage [email or web address] Password [email or web address]  02/13/2024, 10:58 AM      [1] No Known Allergies  "

## 2024-02-13 NOTE — ED Notes (Signed)
 RN to room , pt noted to be sating 88 on RA at providers request for RA trial. Pt placed back on 02 3L [place] @92 

## 2024-02-13 NOTE — ED Provider Notes (Signed)
 " [name] [facility] AT [facility] Provider Note   CSN: [id number] Arrival date & time: 02/13/24  9567     Patient presents with: Shortness of Breath   Kevin Brooks is a 79 y.o. male with history of COPD, hypertension, lung cancer presents to the emerged from today for evaluation of shortness of breath with flulike symptoms.  Patient reports that around yesterday he started to feel some shortness of breath and like he was getting a cold.  He reports that this feels very similar to when he had the flu a few years ago.  Reports some runny nose and congestion as well as some bodyaches.  Reports a cough but is mostly dry.  Did feel like his inhalers help minutely.  No fevers or chills.  Denies any chest pain reports some chest tightness.  No lower leg edema.  Reports has been compliant with his medications.  He does smoke 1 pack/day.  Reports a few beers daily at dinner.  Denies any drug use.  With EMS, patient was found to be hypoxic and improved with nasal cannula.  Shortness of Breath Associated symptoms: cough   Associated symptoms: no abdominal pain, no chest pain, no fever and no vomiting        Prior to Admission medications  Medication Sig Start Date End Date Taking? Authorizing Provider  albuterol  (VENTOLIN  HFA) 108 (90 Base) MCG/ACT inhaler Inhale 2 puffs into the lungs every 6 (six) hours as needed for wheezing or shortness of breath.    [provider]  cholecalciferol  (VITAMIN D3) 25 MCG (1000 UNIT) tablet Take 1,000 Units by mouth daily.    [provider]  citalopram  (CELEXA ) 40 MG tablet Take 40 mg by mouth daily. 08/18/11   [provider]  clopidogrel  (PLAVIX ) 75 MG tablet Take 75 mg by mouth daily. 11/15/16   [provider]  Fluticasone -Umeclidin-Vilant (TRELEGY ELLIPTA ) 100-62.5-25 MCG/INH AEPB Inhale 1 puff into the lungs daily. 10/22/20   Icard, Adine CROME, DO  losartan  (COZAAR ) 100 MG tablet Take 100 mg by  mouth every morning. 02/12/19   [provider]  omeprazole (PRILOSEC) 20 MG capsule Take 20 mg by mouth daily. 03/12/19   [provider]  predniSONE  (DELTASONE ) 20 MG tablet Take 1 tablet (20 mg total) by mouth daily with breakfast. 10/02/23   Shelah Lamar RAMAN, MD  rosuvastatin  (CRESTOR ) 5 MG tablet Take 5 mg by mouth daily.    [provider]  tadalafil (CIALIS) 5 MG tablet Take 5 mg by mouth at bedtime.    [provider]  tamsulosin  (FLOMAX ) 0.4 MG CAPS capsule Take 0.4 mg by mouth at bedtime. 01/01/19   [provider]    Allergies: Patient has no known allergies.    Review of Systems  Constitutional:  Negative for chills and fever.  HENT:  Positive for congestion and rhinorrhea.   Respiratory:  Positive for cough, chest tightness and shortness of breath.   Cardiovascular:  Negative for chest pain and leg swelling.  Gastrointestinal:  Negative for abdominal pain, constipation, diarrhea, nausea and vomiting.  Genitourinary:  Negative for dysuria and hematuria.  Musculoskeletal:  Positive for myalgias.  Neurological:  Negative for dizziness and light-headedness.    Updated Vital Signs BP (!) 155/79   Pulse 95   Temp (!) 97.5 F (36.4 C) (Oral)   Resp 17   SpO2 96%   Physical Exam Vitals and nursing note reviewed.  Constitutional:      General:  He is not in acute distress.    Appearance: He is not toxic-appearing.  Cardiovascular:     Rate and Rhythm: Normal rate.  Pulmonary:     Effort: Pulmonary effort is normal. No tachypnea or respiratory distress.     Breath sounds: Decreased breath sounds and wheezing present.     Comments: Effort normal, no respiratory stress.  He speaking in full sentences with ease, satting well on 2 L without any increased work of breathing.  He is lung sounds do sound very tight with some fine expiratory wheezing heard mainly at the lower bases. Abdominal:     Palpations: Abdomen is soft.     Tenderness:  There is no abdominal tenderness.  Skin:    General: Skin is warm and dry.  Neurological:     Mental Status: He is alert.     (all labs ordered are listed, but only abnormal results are displayed) Labs Reviewed  RESP PANEL BY RT-PCR (RSV, FLU A&B, COVID)  RVPGX2 - Abnormal; Notable for the following components:      Result Value   Resp Syncytial Virus by PCR POSITIVE (*)    All other components within normal limits  BASIC METABOLIC PANEL WITH GFR - Abnormal; Notable for the following components:   Glucose, Bld 119 (*)    All other components within normal limits  CBC - Abnormal; Notable for the following components:   MCH 34.6 (*)    Platelets 141 (*)    All other components within normal limits  BLOOD GAS, VENOUS  TROPONIN T, HIGH SENSITIVITY  TROPONIN T, HIGH SENSITIVITY    EKG: None  Radiology: DG Chest 2 View Result Date: 02/13/2024 EXAM: 2 VIEW(S) XRAY OF THE CHEST 02/13/2024 04:52:00 AM COMPARISON: 08/05/2022 and CT chest from 09/23/2023. CLINICAL HISTORY: Shortness of breath. FINDINGS: LUNGS AND PLEURA: Spiculated density in the right lower lobe with adjacent fiducial marker measuring 1.9 cm, reflecting post-treatment changes for right lower lung mass. Linear opacities in the right lung base compatible with scarring. Emphysematous changes. T No pleural effusion. No pneumothorax. HEART AND MEDIASTINUM: Atherosclerotic calcifications. No acute abnormality of the cardiac and mediastinal silhouettes. BONES AND SOFT TISSUES: No acute osseous abnormality. IMPRESSION: 1. Emphysematous changes, which can contribute to shortness of breath. 2. Spiculated 1.9 cm right lower lobe opacity with adjacent fiducial marker, favored post-treatment change. 3. Right basilar linear scarring. Electronically signed by: Waddell Calk MD 02/13/2024 07:12 AM EST RP Workstation: [id number]   Procedures   Medications Ordered in the ED  methylPREDNISolone  sodium succinate (SOLU-MEDROL ) 125 mg/2 mL  injection 125 mg (125 mg Intravenous Given 02/13/24 0802)  magnesium  sulfate IVPB 2 g 50 mL (2 g Intravenous New Bag/Given 02/13/24 0802)  ipratropium-albuterol  (DUONEB) 0.5-2.5 (3) MG/3ML nebulizer solution 3 mL (3 mLs Nebulization Given 02/13/24 0802)    Clinical Course as of 02/13/24 1019  Sat Feb 13, 2024  0906 Personally evaluated patient, history of COPD not on home oxygen, presents for shortness of breath, patient does have scattered wheezes, is saturating at 92% on 2 L, RVP positive for RSV, thus most appropriate for admission for acute hypoxic respiratory failure secondary to viral infection and COPD exacerbation [LS]    Clinical Course User Index [LS] Rogelia Jerilynn RAMAN, MD   Medical Decision Making Amount and/or Complexity of Data Reviewed Labs: ordered. Radiology: ordered.  Risk Prescription drug management. Decision regarding hospitalization.  79 y.o. male presents to the ER for evaluation of shortness of breath with cough and cold symptoms. Differential  diagnosis includes but is not limited to viral illness, COVID, flu, RSV, bronchitis, PNA, COPD exacerbation. Vital signs heart rate 111 but improved to 95.  Mildly elevated blood pressure 155/79, 96% on 2 L nasal cannula. Physical exam as noted above.   Patient does not appear in any acute distress.  Lung sounds are tight however he speaking in full sentences.  He is on 2 L nasal cannula currently.  I have ordered him a DuoNeb, Solu-Medrol , and magnesium .  Will continue with workup that was initiated in triage.  I independently reviewed and interpreted the patient's labs.  VBG with a pH of 7.45.  pO2 of 58.  CBC without leukocytosis or anemia.  Platelets at 141.  BMP shows glucose of 119 without any electrolyte abnormality.  Positive for RSV.  Initial troponin 14.  Chest x-ray shows 1. Emphysematous changes, which can contribute to shortness of breath. 2. Spiculated 1.9 cm right lower lobe opacity with adjacent fiducial marker,  favored post-treatment change. 3. Right basilar linear scarring.   Patient is lung sound have improved.  Trialed off of oxygen however desatted into the 80s.  He was placed back on oxygen.  Will need admission.  Discussed plan with patient who is amenable.  I discussed this case with my attending physician who cosigned this note including patient's presenting symptoms, physical exam, and planned diagnostics and interventions. Attending physician stated agreement with plan or made changes to plan which were implemented.   Attending physician assessed patient at bedside.  Portions of this report may have been transcribed using voice recognition software. Every effort was made to ensure accuracy; however, inadvertent computerized transcription errors may be present.    Final diagnoses:  RSV (acute bronchiolitis due to respiratory syncytial virus)  Acute respiratory failure with hypoxia [facility])    ED Discharge Orders     None          Bernis Ernst, [place] 02/13/24 1025  "

## 2024-02-13 NOTE — ED Triage Notes (Addendum)
 Pt comes via [facility] from [facility] from SOB, initial SOB was 92, placed on 2L and came up to 98%. Pt has a hx of lung CA and COPD

## 2024-02-14 ENCOUNTER — Observation Stay (HOSPITAL_COMMUNITY)

## 2024-02-14 DIAGNOSIS — J205 Acute bronchitis due to respiratory syncytial virus: Secondary | ICD-10-CM | POA: Diagnosis not present

## 2024-02-14 LAB — CBC
HCT: 44.7 % (ref 39.0–52.0)
Hemoglobin: 16.1 g/dL (ref 13.0–17.0)
MCH: 34.7 pg — ABNORMAL HIGH (ref 26.0–34.0)
MCHC: 36 g/dL (ref 30.0–36.0)
MCV: 96.3 fL (ref 80.0–100.0)
Platelets: 162 10*3/uL (ref 150–400)
RBC: 4.64 MIL/uL (ref 4.22–5.81)
RDW: 12.2 % (ref 11.5–15.5)
WBC: 11.1 10*3/uL — ABNORMAL HIGH (ref 4.0–10.5)
nRBC: 0 % (ref 0.0–0.2)

## 2024-02-14 LAB — BASIC METABOLIC PANEL WITH GFR
Anion gap: 10 (ref 5–15)
BUN: 17 mg/dL (ref 8–23)
CO2: 27 mmol/L (ref 22–32)
Calcium: 9.5 mg/dL (ref 8.9–10.3)
Chloride: 98 mmol/L (ref 98–111)
Creatinine, Ser: 1 mg/dL (ref 0.61–1.24)
GFR, Estimated: >60 mL/min
Glucose, Bld: 120 mg/dL — ABNORMAL HIGH (ref 70–99)
Potassium: 4.1 mmol/L (ref 3.5–5.1)
Sodium: 135 mmol/L (ref 135–145)

## 2024-02-14 LAB — BLOOD GAS, ARTERIAL
Acid-Base Excess: 4.9 mmol/L — ABNORMAL HIGH (ref 0.0–2.0)
Bicarbonate: 31.4 mmol/L — ABNORMAL HIGH (ref 20.0–28.0)
Drawn by: 20012
O2 Content: 10 L/min
O2 Saturation: 99.6 %
Patient temperature: 36.9
pCO2 arterial: 53 mmHg — ABNORMAL HIGH (ref 32–48)
pH, Arterial: 7.38 (ref 7.35–7.45)
pO2, Arterial: 111 mmHg — ABNORMAL HIGH (ref 83–108)

## 2024-02-14 LAB — MRSA NEXT GEN BY PCR, NASAL: MRSA by PCR Next Gen: NOT DETECTED

## 2024-02-14 MED ORDER — METHYLPREDNISOLONE SODIUM SUCC 40 MG IJ SOLR
40.0000 mg | Freq: Once | INTRAMUSCULAR | Status: AC
  Administered 2024-02-14: 40 mg via INTRAVENOUS
  Filled 2024-02-14: qty 1

## 2024-02-14 MED ORDER — SODIUM CHLORIDE 0.9 % IV SOLN
1.0000 g | INTRAVENOUS | Status: DC
  Administered 2024-02-14 – 2024-02-15 (×2): 1 g via INTRAVENOUS
  Filled 2024-02-14 (×3): qty 10

## 2024-02-14 MED ORDER — CLONIDINE HCL 0.1 MG PO TABS
0.1000 mg | ORAL_TABLET | Freq: Two times a day (BID) | ORAL | Status: AC
  Administered 2024-02-14 – 2024-02-19 (×12): 0.1 mg via ORAL
  Filled 2024-02-14 (×12): qty 1

## 2024-02-14 MED ORDER — TAMSULOSIN HCL 0.4 MG PO CAPS
0.4000 mg | ORAL_CAPSULE | Freq: Every day | ORAL | Status: AC
  Administered 2024-02-14 – 2024-02-19 (×6): 0.4 mg via ORAL
  Filled 2024-02-14 (×6): qty 1

## 2024-02-14 MED ORDER — BUDESONIDE 0.25 MG/2ML IN SUSP
0.2500 mg | Freq: Two times a day (BID) | RESPIRATORY_TRACT | Status: AC
  Administered 2024-02-14 – 2024-02-19 (×11): 0.25 mg via RESPIRATORY_TRACT
  Filled 2024-02-14 (×11): qty 2

## 2024-02-14 MED ORDER — METHYLPREDNISOLONE SODIUM SUCC 40 MG IJ SOLR
40.0000 mg | Freq: Two times a day (BID) | INTRAMUSCULAR | Status: DC
  Administered 2024-02-14 – 2024-02-15 (×3): 40 mg via INTRAVENOUS
  Filled 2024-02-14 (×3): qty 1

## 2024-02-14 MED ORDER — BUDESON-GLYCOPYRROL-FORMOTEROL 160-9-4.8 MCG/ACT IN AERO
2.0000 | INHALATION_SPRAY | Freq: Two times a day (BID) | RESPIRATORY_TRACT | Status: DC
  Filled 2024-02-14: qty 5.9

## 2024-02-14 MED ORDER — ARFORMOTEROL TARTRATE 15 MCG/2ML IN NEBU
15.0000 ug | INHALATION_SOLUTION | Freq: Two times a day (BID) | RESPIRATORY_TRACT | Status: AC
  Administered 2024-02-14 – 2024-02-19 (×11): 15 ug via RESPIRATORY_TRACT
  Filled 2024-02-14 (×11): qty 2

## 2024-02-14 MED ORDER — REVEFENACIN 175 MCG/3ML IN SOLN
175.0000 ug | Freq: Every day | RESPIRATORY_TRACT | Status: AC
  Administered 2024-02-14 – 2024-02-19 (×6): 175 ug via RESPIRATORY_TRACT
  Filled 2024-02-14 (×6): qty 3

## 2024-02-14 MED ORDER — IPRATROPIUM-ALBUTEROL 0.5-2.5 (3) MG/3ML IN SOLN
3.0000 mL | RESPIRATORY_TRACT | Status: AC | PRN
  Filled 2024-02-14: qty 3

## 2024-02-14 MED ORDER — ORAL CARE MOUTH RINSE: 15.0000 mL | OROMUCOSAL | Status: AC | PRN

## 2024-02-14 MED ORDER — LOSARTAN POTASSIUM 50 MG PO TABS
100.0000 mg | ORAL_TABLET | Freq: Every day | ORAL | Status: AC
  Administered 2024-02-14 – 2024-02-19 (×6): 100 mg via ORAL
  Filled 2024-02-14 (×6): qty 2

## 2024-02-14 MED ORDER — CHLORHEXIDINE GLUCONATE CLOTH 2 % EX PADS
6.0000 | MEDICATED_PAD | Freq: Every day | CUTANEOUS | Status: AC
  Administered 2024-02-14 – 2024-02-19 (×5): 6 via TOPICAL

## 2024-02-14 NOTE — Progress Notes (Signed)
 RT checked on pt to give Duoneb tx, pt was sitting up @ bedside, eating, I then again checked on him around 0935, he stated he would like to finish breakfast. I informed him that I would be back after 1000, in order to give him time to eat meal.

## 2024-02-14 NOTE — Care Management Obs Status (Signed)
 MEDICARE OBSERVATION STATUS NOTIFICATION   Patient Details  Name: TARIG ZIMMERS MRN: [id number] Date of Birth: Jun 10, 1945   Medicare Observation Status Notification Given:  Yes    Sonda Manuella Quill, RN 02/14/2024, 8:53 AM

## 2024-02-14 NOTE — Progress Notes (Signed)
@   9049 noted patient to have acute respiratory distress with CIWA of 7.  RN treated accordingly per MD orders.  Noted Tachypneic RR 28, use of accessory muscles, coarse bilaterally, dyspneic with any exertion.  MD at bedside with assistance.  New orders placed for STAT ABG's , portable CXR.   Generalized fatigue however, remain awake, alert and orientated. Respiratory therapist at bedside to assist with care.  Unit charge RN and co worker at bedside for team support. ABG's obtained and complete, STAT CXR obtained and complete. New orders placed to keep patient NPO for now, non re breather for transfer to SDU.  Patient to transfer to higher level of care

## 2024-02-14 NOTE — Progress Notes (Signed)
" °   02/14/24 2010  BiPAP/CPAP/SIPAP  BiPAP/CPAP/SIPAP Pt Type Adult  BiPAP/CPAP/SIPAP V60  Mask Type Full face mask  Dentures removed? Not applicable  Set Rate 14 breaths/min  Respiratory Rate 25 breaths/min  IPAP 14 cmH20  EPAP 7 cmH2O  FiO2 (%) 40 %  Minute Ventilation 16.8  Leak 3  Peak Inspiratory Pressure (PIP) 16  Tidal Volume (Vt) 727  Patient Home Machine No  Patient Home Mask No  Patient Home Tubing No  Press High Alarm 30 cmH2O  Press Low Alarm 5 cmH2O  Device Plugged into RED Power Outlet Yes  BiPAP/CPAP /SiPAP Vitals  Pulse Rate 76  Resp 20  SpO2 98 %  Bilateral Breath Sounds Clear;Diminished  MEWS Score/Color  MEWS Score 0  MEWS Score Color Green    "

## 2024-02-14 NOTE — Consult Note (Signed)
 "  NAME:  CHER EGNOR, MRN:  [id number], DOB:  1945-05-18, LOS: 0 ADMISSION DATE:  02/13/2024, CONSULTATION DATE: 02/14/2024 REFERRING MD: TRH, CHIEF COMPLAINT: Alcohol withdrawal  History of Present Illness:  Kevin Brooks is a 79 year old male with a past medical history significant for HTN, HLD, lung cancer, COPD, daily alcohol use, BPH, anxiety and depression who presented to the ED at [facility] 1/31 for acute hypoxic respiratory failure secondary to acute COPD exacerbation with underlying RSV infection.  Patient was placed under observation admission per hospitalist.  Overnight of admission patient slowly began displaying signs of alcohol withdrawal with worsening wheezing and tachycardia.  The patient endorses his breathing feels very labored. He feels like he improved slightly after first being admitted to the hospital, but that this improvement was short-lived and now he feels his breathing is even worse than it was before he was admitted.  Pertinent  Medical History  HTN, HLD, lung cancer, COPD, daily alcohol use, BPH, anxiety and depression   Significant Hospital Events: Including procedures, antibiotic start and stop dates in addition to other pertinent events   1/31 admitted with hypoxic respiratory failure secondary to acute COPD exacerbation with underlying RSV infection 2/1 worsening respiratory distress with slightly elevated CIWA score transferred to stepdown  Interim History / Subjective:  As above  Objective    Blood pressure (!) 156/94, pulse 91, temperature 98.5 F (36.9 C), temperature source Oral, resp. rate (!) 28, height 5' 7 (1.702 m), weight 59.9 kg, SpO2 94%.    FiO2 (%):  [32 %-100 %] 60 %   Intake/Output Summary (Last 24 hours) at 02/14/2024 1338 Last data filed at 02/14/2024 1000 Gross per 24 hour  Intake 240 ml  Output --  Net 240 ml   Filed Weights   02/13/24 1354  Weight: 59.9 kg    Examination: General: Thin, elderly gentleman in moderate  respiratory distress, though able to speak in almost full sentences. HENT: MMM Lungs: Diffuse expiratory wheezing throughout all lung fields. Overt use of accessory muscles of respiration. Cardiovascular: RRR Abdomen: Soft, NTND Extremities: Non-edematous, fine rash over dorsum of left hand near IV site Neuro: Moving all extremities, A&Ox3, normal speech.  Resolved problem list   Assessment and Plan  Acute hypoxic and hypercapnic respiratory failure Acute exacerbation COPD RSV infection Previous non-small cell lung cancer s/p SBRT Active smoker Previous MAI infection 2024 P: -Continue empiric ceftriaxone  given he has AE-COPD, though not a high suspicion for bacterial infection -RSV is undoubtedly the major driver of his respiratory exacerbation -Keep scheduled dose of methylprednisolone  at 40 mg BID. I will give him an additional dose of 40 mg today (I.e. total 120 mg for 2/1 in effort to reduce his risk of endotracheal intubation). -Needs BiPAP to mitigate intubation risk. He agrees to use. He will not be able to maintain present level of respiratory muscle use for many hours on end -- must use BiPAP or will end up intubated as I told him today. -Scheduled DuoNebs + Q2H PRN albuterol  nebs. Stacked nebs fine if needed.  Daily alcohol use P: -I do not think there is any significant evidence of withdrawal. -Defer regarding use of CIWA to hospitalist service, though I would caution against aggressive use of benzodiazepines in this patient with tenuous respiratory status.   Labs   CBC: Recent Labs  Lab 02/13/24 0500 02/14/24 0917  WBC 7.7 11.1*  HGB 16.1 16.1  HCT 44.9 44.7  MCV 96.6 96.3  PLT 141* 162  Basic Metabolic Panel: Recent Labs  Lab 02/13/24 0500 02/14/24 0917  NA 136 135  K 3.9 4.1  CL 99 98  CO2 25 27  GLUCOSE 119* 120*  BUN 11 17  CREATININE 0.85 1.00  CALCIUM  9.5 9.5   GFR: Estimated Creatinine Clearance: 53.2 mL/min (by C-G formula based on SCr  of 1 mg/dL). Recent Labs  Lab 02/13/24 0500 02/14/24 0917  WBC 7.7 11.1*    Liver Function Tests: No results for input(s): AST, ALT, ALKPHOS, BILITOT, PROT, ALBUMIN in the last 168 hours. No results for input(s): LIPASE, AMYLASE in the last 168 hours. No results for input(s): AMMONIA in the last 168 hours.  ABG    Component Value Date/Time   PHART 7.38 02/14/2024 1040   PCO2ART 53 (H) 02/14/2024 1040   PO2ART 111 (H) 02/14/2024 1040   HCO3 31.4 (H) 02/14/2024 1040   TCO2 26.5 01/11/2009 0900   O2SAT 99.6 02/14/2024 1040     Coagulation Profile: No results for input(s): INR, PROTIME in the last 168 hours.  Cardiac Enzymes: No results for input(s): CKTOTAL, CKMB, CKMBINDEX, TROPONINI in the last 168 hours.  HbA1C: Hgb A1c MFr Bld  Date/Time Value Ref Range Status  12/31/2021 09:18 PM 5.7 (H) 4.8 - 5.6 % Final    Comment:    (NOTE)         Prediabetes: 5.7 - 6.4         Diabetes: >6.4         Glycemic control for adults with diabetes: <7.0     CBG: No results for input(s): GLUCAP in the last 168 hours.  Review of Systems:   Please see the history of present illness. All other systems reviewed and are negative   Past Medical History:  He,  has a past medical history of BPH (benign prostatic hyperplasia), Cataracta, COPD (chronic obstructive pulmonary disease) (HCC), Depression, ED (erectile dysfunction), GERD (gastroesophageal reflux disease), Hypertension, Hypogonadism male, Lung cancer (HCC) (11/13/2020), Mixed hyperlipidemia, Palpitations, TIA (transient ischemic attack), and Vision abnormalities.   Surgical History:   Past Surgical History:  Procedure Laterality Date    catarcts surgery     BRONCHIAL BIOPSY  11/13/2020   Procedure: BRONCHIAL BIOPSIES;  Surgeon: Brenna Adine CROME, DO;  Location: [facility];  Service: Pulmonary;;   BRONCHIAL BRUSHINGS  11/13/2020   Procedure: BRONCHIAL BRUSHINGS;  Surgeon: Brenna Adine CROME, DO;   Location: [facility];  Service: Pulmonary;;   BRONCHIAL NEEDLE ASPIRATION BIOPSY  11/13/2020   Procedure: BRONCHIAL NEEDLE ASPIRATION BIOPSIES;  Surgeon: Brenna Adine CROME, DO;  Location: [facility];  Service: Pulmonary;;   COLONOSCOPY     COLONOSCOPY WITH PROPOFOL  N/A 03/16/2020   Procedure: COLONOSCOPY WITH PROPOFOL ;  Surgeon: Rollin Dover, MD;  Location: [facility];  Service: Endoscopy;  Laterality: N/A;   ENTEROSCOPY N/A 03/16/2020   Procedure: ENTEROSCOPY;  Surgeon: Rollin Dover, MD;  Location: [facility];  Service: Endoscopy;  Laterality: N/A;   HEMOSTASIS CLIP PLACEMENT  03/16/2020   Procedure: HEMOSTASIS CLIP PLACEMENT;  Surgeon: Rollin Dover, MD;  Location: [facility];  Service: Endoscopy;;   HOT HEMOSTASIS N/A 03/16/2020   Procedure: HOT HEMOSTASIS (ARGON PLASMA COAGULATION/BICAP);  Surgeon: Rollin Dover, MD;  Location: [facility];  Service: Endoscopy;  Laterality: N/A;   POLYPECTOMY  03/16/2020   Procedure: POLYPECTOMY;  Surgeon: Rollin Dover, MD;  Location: [facility];  Service: Endoscopy;;   VIDEO BRONCHOSCOPY WITH ENDOBRONCHIAL NAVIGATION Right 11/13/2020   Procedure: VIDEO BRONCHOSCOPY WITH ENDOBRONCHIAL NAVIGATION;  Surgeon: Brenna Adine CROME, DO;  Location: [facility];  Service: Pulmonary;  Laterality: Right;  ION w/ fiducial placement   VIDEO BRONCHOSCOPY WITH RADIAL ENDOBRONCHIAL ULTRASOUND  11/13/2020   Procedure: RADIAL ENDOBRONCHIAL ULTRASOUND;  Surgeon: Brenna Adine CROME, DO;  Location: [facility];  Service: Pulmonary;;     Social History:   reports that he has been smoking cigarettes. He has been exposed to tobacco smoke. He has never used smokeless tobacco. He reports current alcohol use of about 28.0 standard drinks of alcohol per week. He reports that he does not use drugs.   Family History:  His family history includes Stroke in his father.   Allergies Allergies[1]   Home Medications  Prior to Admission medications  Medication Sig Start Date End Date  Taking? Authorizing Provider  albuterol  (VENTOLIN  HFA) 108 (90 Base) MCG/ACT inhaler Inhale 2 puffs into the lungs every 6 (six) hours as needed for wheezing or shortness of breath.   Yes [provider]  cholecalciferol  (VITAMIN D3) 25 MCG (1000 UNIT) tablet Take 1,000 Units by mouth daily.   Yes [provider]  citalopram  (CELEXA ) 40 MG tablet Take 40 mg by mouth daily. 08/18/11  Yes [provider]  clopidogrel  (PLAVIX ) 75 MG tablet Take 75 mg by mouth See admin instructions. Mon, Wed, Fri Mornings 11/15/16  Yes [provider]  Fluticasone -Umeclidin-Vilant (TRELEGY ELLIPTA ) 100-62.5-25 MCG/INH AEPB Inhale 1 puff into the lungs daily. 10/22/20  Yes Icard, Adine CROME, DO  losartan  (COZAAR ) 100 MG tablet Take 100 mg by mouth every morning. 02/12/19  Yes [provider]  omeprazole (PRILOSEC) 20 MG capsule Take 20 mg by mouth daily. 03/12/19  Yes [provider]  rosuvastatin  (CRESTOR ) 5 MG tablet Take 5 mg by mouth daily.   Yes [provider]  tadalafil (CIALIS) 5 MG tablet Take 5 mg by mouth at bedtime.   Yes [provider]  tamsulosin  (FLOMAX ) 0.4 MG CAPS capsule Take 0.4 mg by mouth at bedtime. 01/01/19  Yes [provider]        Total critical care time spent by me: 40 minutes   Critical care time was exclusive of separately billable procedures and the treatment of any other patient.   Critical care was necessary to treat or prevent imminent or life-threatening deterioration.  Critical care was time spent personally by me on the following activities: development of treatment plan with patient and/or surrogate as well as nursing, discussions with consultants, re-evaluation of the patient's condition and their response to treatment, examination of patient, obtaining history from patient or surrogate, ordering and performing treatments and interventions, ordering and review of laboratory studies, ordering and review  of radiographic studies, and participation in multidisciplinary rounds.  Lamar Dales, MD Pulmonary, Critical Care & Sleep Medicine [facility]  7a-7p: For contact information, see AMION. If no response to pager, call PCCM Consults pager. After 7p: Call E-Link.      [1] No Known Allergies  "

## 2024-02-14 NOTE — Progress Notes (Signed)
 Patient started wheezing with elevated heart rate in the 140s, CIWA score 6, PRN albuterol  and Ativan  administered, heart rate down to 110s, oxygen at 5L on [place].  BP 183/105  P 113  R 26 O2 saturation 93% EKG shows sinus tach with PACs, Night cover provider notified, new orders received. Clonidine  administered.

## 2024-02-14 NOTE — TOC Initial Note (Signed)
 Transition of Care [facility]) - Initial/Assessment Note    Patient Details  Name: Kevin Brooks MRN: [id number] Date of Birth: 10/09/1945  [facility] CM/SW Contact:    Sonda Manuella Quill, RN Phone Number: 02/14/2024, 9:02 AM  Clinical Narrative:                 Acknowledge consult for SA counseling/education; spoke w/ pt over room phone; pt said he lives at [facility] [place]; he plans to return at d/c; pt identified POC Rob Lafaye (friend) [phone]; he will provide transportation; insurance/PCP verified; pt denied SDOH risks; he does not have DME, HH services, or home oxygen; pt agreed to receive resources for SA; pt verbalized understanding he will make his own appt w/ agencies of choice; resources placed in d/c instructions; IP CM will follow.  Expected Discharge Plan: Home/Self Care Barriers to Discharge: Continued Medical Work up   Patient Goals and CMS Choice Patient states their goals for this hospitalization and ongoing recovery are:: home     [place] ownership interest in [facility].provided to:: Patient    Expected Discharge Plan and Services   Discharge Planning Services: CM Consult   Living arrangements for the past 2 months: Independent Living Facility ([facility])                 DME Arranged: N/A DME Agency: NA       HH Arranged: NA HH Agency: NA        Prior Living Arrangements/Services Living arrangements for the past 2 months: [facility] ([facility]) Lives with:: Self Patient language and need for interpreter reviewed:: Yes Do you feel safe going back to the place where you live?: Yes      Need for Family Participation in Patient Care: Yes (Comment) Care giver support system in place?: Yes (comment) Current home services:  (n/a) Criminal Activity/Legal Involvement Pertinent to Current Situation/Hospitalization: No - Comment as needed  Activities of Daily  Living   ADL Screening (condition at time of admission) Independently performs ADLs?: Yes (appropriate for developmental age) Is the patient deaf or have difficulty hearing?: No Does the patient have difficulty seeing, even when wearing glasses/contacts?: No Does the patient have difficulty concentrating, remembering, or making decisions?: No  Permission Sought/Granted Permission sought to share information with : Case Manager Permission granted to share information with : Yes, Verbal Permission Granted  Share Information with NAME: Case Manager     Permission granted to share info w Relationship: Myer Lafaye (friend) [phone]     Emotional Assessment Appearance:: Appears stated age Attitude/Demeanor/Rapport: Gracious Affect (typically observed): Accepting Orientation: : Oriented to Self, Oriented to Place, Oriented to  Time, Oriented to Situation Alcohol / Substance Use: Not Applicable Psych Involvement: No (comment)  Admission diagnosis:  RSV (acute bronchiolitis due to respiratory syncytial virus) [J21.0] Acute respiratory failure with hypoxia (HCC) [J96.01] Acute bronchitis due to respiratory syncytial virus (RSV) [J20.5] Patient Active Problem List   Diagnosis Date Noted   Acute bronchitis due to respiratory syncytial virus (RSV) 02/13/2024   COPD (chronic obstructive pulmonary disease) (HCC) 04/01/2023   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   Lung nodules 08/05/2022   Tobacco use 08/05/2022   Acute respiratory failure with hypoxia (HCC) 12/31/2021   Malignant neoplasm of lower lobe of right lung (HCC) 10/22/2020   PCP:  Windy Coy, MD Pharmacy:   [facility] [phone] - [place],  - [address] AT  [address] [facility] [address] [place] [address] Phone: [phone] Fax: [phone]  [facility] [id number] [place], [place] - [address] [place] [address] Phone: [phone] Fax: [phone]     Social Drivers of Health (SDOH) Social History: SDOH Screenings   Food Insecurity: No Food Insecurity (02/14/2024)  Housing: Low Risk (02/14/2024)  Transportation Needs: No Transportation Needs (02/14/2024)  Utilities: Not At Risk (02/14/2024)  Depression (PHQ2-9): Low Risk (12/30/2022)  Social Connections: Moderately Integrated (02/13/2024)  Tobacco Use: High Risk (02/05/2024)   SDOH Interventions: Food Insecurity Interventions: Intervention Not Indicated, Inpatient TOC Housing Interventions: Intervention Not Indicated, Inpatient TOC Transportation Interventions: Intervention Not Indicated, Inpatient TOC Utilities Interventions: Intervention Not Indicated, Inpatient TOC   Readmission Risk Interventions     No data to display

## 2024-02-14 NOTE — Plan of Care (Signed)
   Problem: Coping: Goal: Level of anxiety will decrease Outcome: Progressing   Problem: Pain Managment: Goal: General experience of comfort will improve and/or be controlled Outcome: Progressing   Problem: Safety: Goal: Ability to remain free from injury will improve Outcome: Progressing

## 2024-02-15 ENCOUNTER — Inpatient Hospital Stay (HOSPITAL_COMMUNITY)

## 2024-02-15 DIAGNOSIS — J9601 Acute respiratory failure with hypoxia: Secondary | ICD-10-CM | POA: Diagnosis not present

## 2024-02-15 DIAGNOSIS — J441 Chronic obstructive pulmonary disease with (acute) exacerbation: Secondary | ICD-10-CM

## 2024-02-15 DIAGNOSIS — J9602 Acute respiratory failure with hypercapnia: Secondary | ICD-10-CM | POA: Diagnosis not present

## 2024-02-15 DIAGNOSIS — J205 Acute bronchitis due to respiratory syncytial virus: Secondary | ICD-10-CM

## 2024-02-15 DIAGNOSIS — J210 Acute bronchiolitis due to respiratory syncytial virus: Principal | ICD-10-CM

## 2024-02-15 DIAGNOSIS — Z720 Tobacco use: Secondary | ICD-10-CM

## 2024-02-15 LAB — CBC WITH DIFFERENTIAL/PLATELET
Abs Immature Granulocytes: 0.04 10*3/uL (ref 0.00–0.07)
Basophils Absolute: 0 10*3/uL (ref 0.0–0.1)
Basophils Relative: 0 %
Eosinophils Absolute: 0 10*3/uL (ref 0.0–0.5)
Eosinophils Relative: 0 %
HCT: 44.9 % (ref 39.0–52.0)
Hemoglobin: 16.2 g/dL (ref 13.0–17.0)
Immature Granulocytes: 1 %
Lymphocytes Relative: 16 %
Lymphs Abs: 1.3 10*3/uL (ref 0.7–4.0)
MCH: 34.8 pg — ABNORMAL HIGH (ref 26.0–34.0)
MCHC: 36.1 g/dL — ABNORMAL HIGH (ref 30.0–36.0)
MCV: 96.4 fL (ref 80.0–100.0)
Monocytes Absolute: 0.8 10*3/uL (ref 0.1–1.0)
Monocytes Relative: 11 %
Neutro Abs: 5.6 10*3/uL (ref 1.7–7.7)
Neutrophils Relative %: 72 %
Platelets: 160 10*3/uL (ref 150–400)
RBC: 4.66 MIL/uL (ref 4.22–5.81)
RDW: 12.2 % (ref 11.5–15.5)
WBC: 7.8 10*3/uL (ref 4.0–10.5)
nRBC: 0 % (ref 0.0–0.2)

## 2024-02-15 LAB — COMPREHENSIVE METABOLIC PANEL WITH GFR
ALT: 13 U/L (ref 0–44)
AST: 18 U/L (ref 15–41)
Albumin: 4.3 g/dL (ref 3.5–5.0)
Alkaline Phosphatase: 47 U/L (ref 38–126)
Anion gap: 10 (ref 5–15)
BUN: 24 mg/dL — ABNORMAL HIGH (ref 8–23)
CO2: 29 mmol/L (ref 22–32)
Calcium: 9.7 mg/dL (ref 8.9–10.3)
Chloride: 99 mmol/L (ref 98–111)
Creatinine, Ser: 0.92 mg/dL (ref 0.61–1.24)
GFR, Estimated: >60 mL/min
Glucose, Bld: 127 mg/dL — ABNORMAL HIGH (ref 70–99)
Potassium: 4.4 mmol/L (ref 3.5–5.1)
Sodium: 137 mmol/L (ref 135–145)
Total Bilirubin: 0.5 mg/dL (ref 0.0–1.2)
Total Protein: 6.7 g/dL (ref 6.5–8.1)

## 2024-02-15 MED ORDER — THIAMINE MONONITRATE 100 MG PO TABS
100.0000 mg | ORAL_TABLET | Freq: Every day | ORAL | Status: AC
  Administered 2024-02-15 – 2024-02-19 (×5): 100 mg via ORAL
  Filled 2024-02-15 (×5): qty 1

## 2024-02-15 MED ORDER — FOLIC ACID 1 MG PO TABS
1.0000 mg | ORAL_TABLET | Freq: Every day | ORAL | Status: AC
  Administered 2024-02-15 – 2024-02-19 (×5): 1 mg via ORAL
  Filled 2024-02-15 (×5): qty 1

## 2024-02-15 MED ORDER — ADULT MULTIVITAMIN W/MINERALS CH
1.0000 | ORAL_TABLET | Freq: Every day | ORAL | Status: AC
  Administered 2024-02-15 – 2024-02-19 (×5): 1 via ORAL
  Filled 2024-02-15 (×5): qty 1

## 2024-02-15 NOTE — Plan of Care (Signed)

## 2024-02-15 NOTE — Progress Notes (Signed)
" °   02/15/24 2210  BiPAP/CPAP/SIPAP  BiPAP/CPAP/SIPAP Pt Type Adult  BiPAP/CPAP/SIPAP V60  Mask Type Full face mask  Dentures removed? Not applicable  Mask Size Extra large  Set Rate 14 breaths/min  Respiratory Rate 17 breaths/min  IPAP 14 cmH20  EPAP 7 cmH2O  FiO2 (%) 35 %  Flow Rate 0 lpm  Minute Ventilation 12.3  Leak 2  Peak Inspiratory Pressure (PIP) 14  Tidal Volume (Vt) 712  Patient Home Machine No  Patient Home Mask No  Patient Home Tubing No  Auto Titrate No  Press High Alarm 30 cmH2O  Press Low Alarm 5 cmH2O  CPAP/SIPAP surface wiped down Yes  Device Plugged into RED Power Outlet Yes  Oxygen Percent 35 %  BiPAP/CPAP /SiPAP Vitals  Pulse Rate 77  Resp 17  SpO2 96 %  Bilateral Breath Sounds Diminished;Fine crackles  MEWS Score/Color  MEWS Score 0  MEWS Score Color Green    "

## 2024-02-15 NOTE — Progress Notes (Addendum)
 TRH  CREG GILMER FMW:[id number]  DOB: 12-30-1945  DOA: 02/13/2024  PCP: Windy Coy, MD  02/15/2024,10:26 AM  LOS: 1 day    Code Status: Full code     from: [facility]  79 year old male COPD GERD prior smoking previous TIA Previous pulmonary MAI since 08/2022 treated (rifabutin  azithromycin  ethambutol ) under care of ID finished treatment November 2025 Previous RLL NSCLC SBRT 11/2020-residual right lower lobe lesion 4.6 X 2 cm-continued 1 pack/day smoker I the office 02/05/2024 repeat cultures for MAI 12/03/2023 showed no positivity and was told to follow-up with infectious disease in 3 to 6 months  Presents from [facility] short of breath sats 92% flulike symptoms runny nose congestion body ache sodium 136 potassium 3.9 BUN/creatinine 11/0.8 troponin 14 WBC 7.7 hemoglobin 16.1 platelet 141-VBG 7.45 pCO2 42 bicarb 29 RSV positive CXR asymmetric changes-spiculated 1.9 RLL opacity posttreatment changes right basilar linear scarring 2/1 overnight became more tachypneic dyspneic and was sent to stepdown unit   Assessment  & Plan :    Acute respiratory failure with mild hypercarbia in the setting of RSV positive  Less wheezy and improved --wean oxygen as able-- seems to be tolerating 8 L and should be able to wean later today--BiPAP prn --overall his condition is slightly improved Continue oxygen therapies, increase nebs to 4 times daily scheduled, add Breztri , increase steroids to Solu-Medrol  40 every 12 --- not stable to transfer out of unit just yet  Previous MAI 2024 Previous RLL NSCLC SBRT still smoker I will add ceftriaxone  for respiratory toilet and to help sterilize Monitor trends---CXR shows no new issues I do not think that this is secondary to any other etiology such as a PE-he is on Lovenox  as below  Mild AKI Add IVf 40 cc/h---am labs  Habitual ethanol use with dinner Not a heavy drinker-placed on CIWA protocol earlier---held as do not think that this is   Data Reviewed  today: bun/cr 24/0.9 today, wbc down to 7.8  DVT prophylaxis: Lovenox .  Dispo/Global plan: progressive --await improvement   Subjective:   Less dyspneic appearing --talking full sentences, seems more comfortable-no CP.  Some sputum  Objective + exam Vitals:   02/15/24 0500 02/15/24 0600 02/15/24 0808 02/15/24 0835  BP: 133/77 (!) 166/82    Pulse: 74 74  (!) 103  Resp: 18 19  (!) 32  Temp:   97.8 F (36.6 C)   TempSrc:   Oral   SpO2: 96% 96%  95%  Weight:      Height:       Filed Weights   02/13/24 1354  Weight: 59.9 kg   Examination:  Awake coherent alert no distress No wheeze rales rhonchi no adventitious sounds-slightly increased work of breathing Abdomen soft no rebound S1-S2 no murmur does have PVCs on monitors No lower extremity edema  Scheduled Meds:  arformoterol   15 mcg Nebulization BID   budesonide  (PULMICORT ) nebulizer solution  0.25 mg Nebulization BID   Chlorhexidine  Gluconate Cloth  6 each Topical Daily   citalopram   40 mg Oral Daily   cloNIDine   0.1 mg Oral BID   clopidogrel   75 mg Oral Once per day on Monday Wednesday Friday   enoxaparin  (LOVENOX ) injection  40 mg Subcutaneous Daily   folic acid   1 mg Oral Daily   losartan   100 mg Oral Daily   methylPREDNISolone  (SOLU-MEDROL ) injection  40 mg Intravenous Q12H   multivitamin with minerals  1 tablet Oral Daily   pantoprazole   40 mg Oral Daily  revefenacin   175 mcg Nebulization Daily   tamsulosin   0.4 mg Oral QHS   thiamine   100 mg Oral Daily   Continuous Infusions:  cefTRIAXone  (ROCEPHIN )  IV Stopped (02/14/24 1346)   acetaminophen  **OR** acetaminophen , ipratropium-albuterol , ondansetron  **OR** ondansetron  (ZOFRAN ) IV, mouth rinse, traZODone   35 minutes  Jai-Gurmukh Gray Maugeri, MD  [facility]

## 2024-02-16 DIAGNOSIS — J205 Acute bronchitis due to respiratory syncytial virus: Secondary | ICD-10-CM | POA: Diagnosis not present

## 2024-02-16 LAB — CBC WITH DIFFERENTIAL/PLATELET
Abs Immature Granulocytes: 0.03 10*3/uL (ref 0.00–0.07)
Basophils Absolute: 0 10*3/uL (ref 0.0–0.1)
Basophils Relative: 0 %
Eosinophils Absolute: 0 10*3/uL (ref 0.0–0.5)
Eosinophils Relative: 0 %
HCT: 47.9 % (ref 39.0–52.0)
Hemoglobin: 16.2 g/dL (ref 13.0–17.0)
Immature Granulocytes: 0 %
Lymphocytes Relative: 10 %
Lymphs Abs: 1.2 10*3/uL (ref 0.7–4.0)
MCH: 33.9 pg (ref 26.0–34.0)
MCHC: 33.8 g/dL (ref 30.0–36.0)
MCV: 100.2 fL — ABNORMAL HIGH (ref 80.0–100.0)
Monocytes Absolute: 0.5 10*3/uL (ref 0.1–1.0)
Monocytes Relative: 5 %
Neutro Abs: 9.4 10*3/uL — ABNORMAL HIGH (ref 1.7–7.7)
Neutrophils Relative %: 85 %
Platelets: 163 10*3/uL (ref 150–400)
RBC: 4.78 MIL/uL (ref 4.22–5.81)
RDW: 12.1 % (ref 11.5–15.5)
WBC: 11.1 10*3/uL — ABNORMAL HIGH (ref 4.0–10.5)
nRBC: 0 % (ref 0.0–0.2)

## 2024-02-16 LAB — BASIC METABOLIC PANEL WITH GFR
Anion gap: 11 (ref 5–15)
BUN: 29 mg/dL — ABNORMAL HIGH (ref 8–23)
CO2: 29 mmol/L (ref 22–32)
Calcium: 9.6 mg/dL (ref 8.9–10.3)
Chloride: 99 mmol/L (ref 98–111)
Creatinine, Ser: 0.86 mg/dL (ref 0.61–1.24)
GFR, Estimated: >60 mL/min
Glucose, Bld: 135 mg/dL — ABNORMAL HIGH (ref 70–99)
Potassium: 4.4 mmol/L (ref 3.5–5.1)
Sodium: 138 mmol/L (ref 135–145)

## 2024-02-16 MED ORDER — SODIUM CHLORIDE 0.9 % IV SOLN: INTRAVENOUS | Status: AC

## 2024-02-16 MED ORDER — SODIUM CHLORIDE 0.9 % IV SOLN
1.0000 g | INTRAVENOUS | Status: AC
  Administered 2024-02-16 – 2024-02-17 (×2): 1 g via INTRAVENOUS
  Filled 2024-02-16 (×3): qty 10

## 2024-02-16 MED ORDER — PREDNISONE 50 MG PO TABS
60.0000 mg | ORAL_TABLET | Freq: Every day | ORAL | Status: DC
  Administered 2024-02-16 – 2024-02-18 (×3): 60 mg via ORAL
  Filled 2024-02-16: qty 1
  Filled 2024-02-16 (×2): qty 3

## 2024-02-16 NOTE — Evaluation (Signed)
 Physical Therapy Evaluation Patient Details Name: Kevin Brooks MRN: [id number] DOB: 03/03/1945 Today's Date: 02/16/2024  History of Present Illness  Kevin Brooks is a 79 year old male who presented to the ED at [facility] 1/31 now admitted for acute hypoxic respiratory failure secondary to acute COPD exacerbation with underlying RSV infection. PMH: HTN, HLD, lung cancer, COPD, daily alcohol use, BPH, anxiety and depression  Clinical Impression  Pt admitted with above diagnosis.  Pt currently with functional limitations due to the deficits listed below (see PT Problem List). Pt will benefit from acute skilled PT to increase their independence and safety with mobility to allow discharge.     The patient received in bed on 4 L [place], SPO2  955. Patient  mobilized to sitting and stepped to recliner with Contact guard assist  to recliner. SPo2 remained >90%. Patient is noted to be  weak when standing and stepping. Patient resides al;one  at [facility]  modular home. Patient attends  fitness classes 3 times a week, is independent. Patient reports support at [place]. Patient should progress to return to his home. SPO2 > 90% , Noted DOE with minimal activity.       If plan is discharge home, recommend the following: A little help with bathing/dressing/bathroom;Assist for transportation;Help with stairs or ramp for entrance   Can travel by private vehicle        Equipment Recommendations None recommended by PT  Recommendations for Other Services       Functional Status Assessment Patient has had a recent decline in their functional status and demonstrates the ability to make significant improvements in function in a reasonable and predictable amount of time.     Precautions / Restrictions Precautions Precaution/Restrictions Comments: watch sats/RR/HR Restrictions Weight Bearing Restrictions Per Provider Order: No      Mobility  Bed Mobility   Bed Mobility: Supine to Sit     Supine to  sit: Supervision, HOB elevated          Transfers Overall transfer level: Needs assistance Equipment used: None Transfers: Sit to/from Stand, Bed to chair/wheelchair/BSC Sit to Stand: Contact guard assist   Step pivot transfers: Contact guard assist       General transfer comment: min cues for hand placement and assist with tubing and IV lines    Ambulation/Gait                  Stairs            Wheelchair Mobility     Tilt Bed    Modified Rankin (Stroke Patients Only)       Balance Overall balance assessment: Mild deficits observed, not formally tested                                           Pertinent Vitals/Pain Pain Assessment Pain Assessment: No/denies pain    Home Living Family/patient expects to be discharged to:: Private residence (indep living) Living Arrangements: Alone Available Help at Discharge: Friend(s);Neighbor;Available 24 hours/day (reports a friend can provide assist upon d/c) Type of Home: Independent living facility Home Access: Level entry       Home Layout: One level Home Equipment: Shower seat - built in;Grab bars - tub/shower Additional Comments: lives in [place] at [place]    Prior Function Prior Level of Function : Independent/Modified Independent  Mobility Comments: indep, attends fitness class 3x per week ADLs Comments: indep, drives     Extremity/Trunk Assessment   Upper Extremity Assessment Upper Extremity Assessment: Generalized weakness;Right hand dominant    Lower Extremity Assessment Lower Extremity Assessment: Generalized weakness    Cervical / Trunk Assessment Cervical / Trunk Assessment: Normal  Communication   Communication Communication: No apparent difficulties    Cognition Arousal: Alert Behavior During Therapy: WFL for tasks assessed/performed   PT - Cognitive impairments: No apparent impairments                         Following  commands: Intact       Cueing Cueing Techniques: Verbal cues     General Comments General comments (skin integrity, edema, etc.): on 3 ltrs O2 via [employer], RR up to 30-33 with min cues for paced breathing strategies, no edema present, decreased overall activity tolerance and DOE    Exercises     Assessment/Plan    PT Assessment Patient needs continued PT services  PT Problem List Decreased strength;Cardiopulmonary status limiting activity;Decreased activity tolerance       PT Treatment Interventions DME instruction;Gait training;Functional mobility training;Therapeutic activities;Therapeutic exercise    PT Goals (Current goals can be found in the Care Plan section)  Acute Rehab PT Goals Patient Stated Goal: get beter and go home PT Goal Formulation: With patient Time For Goal Achievement: 03/01/24 Potential to Achieve Goals: Good    Frequency Min 3X/week     Co-evaluation PT/OT/SLP Co-Evaluation/Treatment: Yes Reason for Co-Treatment: For patient/therapist safety PT goals addressed during session: Mobility/safety with mobility;Balance OT goals addressed during session: ADL's and self-care;Proper use of Adaptive equipment and DME       AM-PAC PT 6 Clicks Mobility  Outcome Measure Help needed turning from your back to your side while in a flat bed without using bedrails?: None Help needed moving from lying on your back to sitting on the side of a flat bed without using bedrails?: None Help needed moving to and from a bed to a chair (including a wheelchair)?: A Little Help needed standing up from a chair using your arms (e.g., wheelchair or bedside chair)?: A Little Help needed to walk in hospital room?: Total Help needed climbing 3-5 steps with a railing? : Total 6 Click Score: 16    End of Session   Activity Tolerance: Patient tolerated treatment well;Treatment limited secondary to medical complications (Comment) Patient left: in chair;with call bell/phone within  reach Nurse Communication: Mobility status PT Visit Diagnosis: Unsteadiness on feet (R26.81);Muscle weakness (generalized) (M62.81);Difficulty in walking, not elsewhere classified (R26.2)    Time: 8943-8885 PT Time Calculation (min) (ACUTE ONLY): 18 min   Charges:   PT Evaluation $PT Eval Low Complexity: 1 Low   PT General Charges $$ ACUTE PT VISIT: 1 Visit         Darice Potters PT Acute Rehabilitation Services Office [phone]   Potters Darice Norris 02/16/2024, 1:08 PM

## 2024-02-16 NOTE — Progress Notes (Signed)
" °   02/16/24 0144  BiPAP/CPAP/SIPAP  BiPAP/CPAP/SIPAP Pt Type Adult  BiPAP/CPAP/SIPAP V60  Mask Type Full face mask  Dentures removed? Not applicable  Mask Size Extra large  Set Rate 14 breaths/min  Respiratory Rate 18 breaths/min  IPAP 14 cmH20  EPAP 7 cmH2O  FiO2 (%) 35 %  Minute Ventilation 12.3  Leak 8  Peak Inspiratory Pressure (PIP) 14  Tidal Volume (Vt) 639  Patient Home Machine No  Patient Home Mask No  Patient Home Tubing No  Auto Titrate No  Press High Alarm 30 cmH2O  Press Low Alarm 5 cmH2O  Device Plugged into RED Power Outlet Yes  Oxygen Percent 35 %  BiPAP/CPAP /SiPAP Vitals  Pulse Rate 79  Resp 18  SpO2 96 %  Bilateral Breath Sounds Diminished;Fine crackles  MEWS Score/Color  MEWS Score 0  MEWS Score Color Green    "

## 2024-02-16 NOTE — Progress Notes (Signed)
 CPT on hold(bed) due to pt in chair. Pt did do flutter valve.

## 2024-02-16 NOTE — Plan of Care (Signed)

## 2024-02-16 NOTE — Progress Notes (Signed)
 Patient explained to this nurse he was no longer going to wear the BiPAP for the night. This RN explained the repercussions of not wearing it. This RN also stated that his MD and care team have an order for it to be worn. The patient confirmed he was not going to wear it and understood his actions. The patient is on 5 L [place] at this time. This RN notified both the NP and Respiratory Therapist.

## 2024-02-16 NOTE — Evaluation (Signed)
 Occupational Therapy Evaluation Patient Details Name: Kevin Brooks MRN: [id number] DOB: 1945/10/09 Today's Date: 02/16/2024   History of Present Illness   IBRAHIM MCPHEETERS is a 79 year old male who presented to the ED at [facility] 1/31 now admitted for acute hypoxic respiratory failure secondary to acute COPD exacerbation with underlying RSV infection. PMH: HTN, HLD, lung cancer, COPD, daily alcohol use, BPH, anxiety and depression     Clinical Impressions PTA, patient lives at home in [place] home at [facility] and was independent with all aspects of A/IADL's and mobility including driving and attending 3x per week fitness classes and was not on supplemental O2. Currently, patient presents with deficits outlined below (see OT Problem List for details) most significantly decreased higher level balance, activity tolerance on 3 ltrs supplemental O2 and mild generalized muscle weakness limiting BADL's and functional mobility performance (limited to step pivot due to DOE/RR). Patient reports he can have a friend stay with him for assist upon discharge from hospital thus anticipate [facility] upon acute hospital discharge.  Patient requires continued Acute care hospital level OT services to progress safety and functional performance and allow for discharge.       If plan is discharge home, recommend the following:   A lot of help with walking and/or transfers;A lot of help with bathing/dressing/bathroom;Assistance with cooking/housework;Assist for transportation;Help with stairs or ramp for entrance     Functional Status Assessment   Patient has had a recent decline in their functional status and demonstrates the ability to make significant improvements in function in a reasonable and predictable amount of time.     Equipment Recommendations   None recommended by OT      Precautions/Restrictions   Precautions Precautions: None Precaution/Restrictions Comments: watch  sats/RR/HR Restrictions Weight Bearing Restrictions Per Provider Order: No     Mobility Bed Mobility Overal bed mobility: Needs Assistance Bed Mobility: Supine to Sit     Supine to sit: Supervision, HOB elevated          Transfers Overall transfer level: Needs assistance Equipment used: None Transfers: Sit to/from Stand, Bed to chair/wheelchair/BSC Sit to Stand: Contact guard assist     Step pivot transfers: Contact guard assist     General transfer comment: min cues for hand placement and assist with tubing and IV lines      Balance Overall balance assessment: Mild deficits observed, not formally tested                                         ADL either performed or assessed with clinical judgement   ADL Overall ADL's : Needs assistance/impaired Eating/Feeding: Set up;Sitting   Grooming: Wash/dry hands;Wash/dry face;Oral care;Brushing hair;Set up;Sitting Grooming Details (indicate cue type and reason): min cues for breathing integration Upper Body Bathing: Contact guard assist;Sitting   Lower Body Bathing: Sit to/from stand;Minimal assistance   Upper Body Dressing : Contact guard assist;Minimal assistance;Sitting   Lower Body Dressing: Minimal assistance Lower Body Dressing Details (indicate cue type and reason): able to demo figure 4 for socks in sitting Toilet Transfer: Contact guard assist   Toileting- Clothing Manipulation and Hygiene: Contact guard assist;Sit to/from stand Toileting - Clothing Manipulation Details (indicate cue type and reason): using urinal in standing     Functional mobility during ADLs: Contact guard assist (step pivot only) General ADL Comments: limited mobility and prolonged activity due to RR  Vision Baseline Vision/History: 0 No visual deficits;1 Wears glasses Ability to See in Adequate Light: 0 Adequate Patient Visual Report: No change from baseline Vision Assessment?: No apparent visual deficits;Wears  glasses for reading            Pertinent Vitals/Pain Pain Assessment Pain Assessment: No/denies pain     Extremity/Trunk Assessment Upper Extremity Assessment Upper Extremity Assessment: Generalized weakness;Right hand dominant   Lower Extremity Assessment Lower Extremity Assessment: Defer to PT evaluation   Cervical / Trunk Assessment Cervical / Trunk Assessment: Normal   Communication Communication Communication: No apparent difficulties   Cognition Arousal: Alert Behavior During Therapy: WFL for tasks assessed/performed Cognition: No apparent impairments                               Following commands: Intact       Cueing  General Comments   Cueing Techniques: Verbal cues  on 3 ltrs O2 via [place], RR up to 30-33 with min cues for paced breathing strategies, no edema present, decreased overall activity tolerance and DOE           Home Living Family/patient expects to be discharged to:: Private residence (indep living) Living Arrangements: Alone Available Help at Discharge: Friend(s);Neighbor;Available 24 hours/day (reports a friend can provide assist upon d/c) Type of Home: Independent living facility Home Access: Level entry     Home Layout: One level     Bathroom Shower/Tub: Walk-in shower;Door   [employer] Toilet: Pharmacist, Community: Yes How Accessible: Accessible via walker Home Equipment: Shower seat - built in;Grab bars - tub/shower   Additional Comments: lives in [place] at [place]      Prior Functioning/Environment Prior Level of Function : Independent/Modified Independent             Mobility Comments: indep, attends fitness class 3x per week ADLs Comments: indep, drives    OT Problem List: Decreased strength;Decreased activity tolerance;Impaired balance (sitting and/or standing);Decreased knowledge of use of DME or AE;Decreased knowledge of precautions;Cardiopulmonary status limiting activity   OT  Treatment/Interventions: Self-care/ADL training;Therapeutic exercise;Energy conservation;DME and/or AE instruction;Therapeutic activities;Patient/family education;Balance training      OT Goals(Current goals can be found in the care plan section)   Acute Rehab OT Goals Patient Stated Goal: to get stronger OT Goal Formulation: With patient Time For Goal Achievement: 03/01/24 Potential to Achieve Goals: Good ADL Goals Pt Will Perform Lower Body Bathing: with supervision;sit to/from stand Pt Will Perform Lower Body Dressing: with supervision;sit to/from stand Pt Will Transfer to Toilet: with supervision;regular height toilet;ambulating Pt Will Perform Toileting - Clothing Manipulation and hygiene: with supervision;sit to/from stand Pt Will Perform Tub/Shower Transfer: Shower transfer;shower seat;ambulating;grab bars;with supervision Pt/caregiver will Perform Home Exercise Program: Increased strength;Both right and left upper extremity;With written HEP provided;Independently Additional ADL Goal #1: Patient will teach back ECT and breathing strategies with indep during ADL's and mobility   OT Frequency:  Min 2X/week    Co-evaluation   Reason for Co-Treatment: For patient/therapist safety PT goals addressed during session: Mobility/safety with mobility;Balance OT goals addressed during session: ADL's and self-care;Proper use of Adaptive equipment and DME      AM-PAC OT 6 Clicks Daily Activity     Outcome Measure Help from another person eating meals?: None Help from another person taking care of personal grooming?: A Little Help from another person toileting, which includes using toliet, bedpan, or urinal?: A Little Help from another person bathing (including washing, rinsing,  drying)?: A Little Help from another person to put on and taking off regular upper body clothing?: A Little Help from another person to put on and taking off regular lower body clothing?: A Little 6 Click  Score: 19   End of Session Equipment Utilized During Treatment: Gait belt Nurse Communication: Mobility status  Activity Tolerance: Patient limited by fatigue;Other (comment) (RR/DOE) Patient left: in chair;with call bell/phone within reach;with chair alarm set  OT Visit Diagnosis: Unsteadiness on feet (R26.81);Muscle weakness (generalized) (M62.81)                Time: 8943-8874 OT Time Calculation (min): 29 min Charges:  OT General Charges $OT Visit: 1 Visit OT Evaluation $OT Eval Low Complexity: 1 Low  Moataz Tavis OT/L Acute Rehabilitation Department  [phone]  02/16/2024, 12:13 PM

## 2024-02-17 DIAGNOSIS — J441 Chronic obstructive pulmonary disease with (acute) exacerbation: Secondary | ICD-10-CM | POA: Diagnosis not present

## 2024-02-17 DIAGNOSIS — I10 Essential (primary) hypertension: Secondary | ICD-10-CM | POA: Diagnosis not present

## 2024-02-17 DIAGNOSIS — J210 Acute bronchiolitis due to respiratory syncytial virus: Secondary | ICD-10-CM | POA: Diagnosis not present

## 2024-02-17 DIAGNOSIS — J205 Acute bronchitis due to respiratory syncytial virus: Secondary | ICD-10-CM | POA: Diagnosis not present

## 2024-02-17 LAB — BASIC METABOLIC PANEL WITH GFR
Anion gap: 7 (ref 5–15)
BUN: 22 mg/dL (ref 8–23)
CO2: 29 mmol/L (ref 22–32)
Calcium: 8.8 mg/dL — ABNORMAL LOW (ref 8.9–10.3)
Chloride: 102 mmol/L (ref 98–111)
Creatinine, Ser: 0.76 mg/dL (ref 0.61–1.24)
GFR, Estimated: >60 mL/min
Glucose, Bld: 104 mg/dL — ABNORMAL HIGH (ref 70–99)
Potassium: 4 mmol/L (ref 3.5–5.1)
Sodium: 138 mmol/L (ref 135–145)

## 2024-02-17 LAB — CBC WITH DIFFERENTIAL/PLATELET
Abs Immature Granulocytes: 0.05 10*3/uL (ref 0.00–0.07)
Basophils Absolute: 0 10*3/uL (ref 0.0–0.1)
Basophils Relative: 0 %
Eosinophils Absolute: 0 10*3/uL (ref 0.0–0.5)
Eosinophils Relative: 0 %
HCT: 43.1 % (ref 39.0–52.0)
Hemoglobin: 14.6 g/dL (ref 13.0–17.0)
Immature Granulocytes: 1 %
Lymphocytes Relative: 20 %
Lymphs Abs: 2 10*3/uL (ref 0.7–4.0)
MCH: 33.7 pg (ref 26.0–34.0)
MCHC: 33.9 g/dL (ref 30.0–36.0)
MCV: 99.5 fL (ref 80.0–100.0)
Monocytes Absolute: 0.9 10*3/uL (ref 0.1–1.0)
Monocytes Relative: 8 %
Neutro Abs: 7.4 10*3/uL (ref 1.7–7.7)
Neutrophils Relative %: 71 %
Platelets: 148 10*3/uL — ABNORMAL LOW (ref 150–400)
RBC: 4.33 MIL/uL (ref 4.22–5.81)
RDW: 12 % (ref 11.5–15.5)
WBC: 10.3 10*3/uL (ref 4.0–10.5)
nRBC: 0 % (ref 0.0–0.2)

## 2024-02-17 MED ORDER — HYDRALAZINE HCL 25 MG PO TABS
25.0000 mg | ORAL_TABLET | Freq: Three times a day (TID) | ORAL | Status: AC | PRN
  Administered 2024-02-17: 25 mg via ORAL
  Filled 2024-02-17: qty 1

## 2024-02-17 NOTE — TOC Progression Note (Signed)
 Transition of Care [facility]) - Progression Note    Patient Details  Name: Kevin Brooks MRN: [id number] Date of Birth: Sep 21, 1945  Transition of Care [facility]) CM/SW Contact  Jon ONEIDA Anon, RN Phone Number: 02/17/2024, 10:16 AM  Clinical Narrative:    PT/OT recommending HH PT/OT at discharge. RNCM spoke with Admissions Coordinator Sierra at [facility] and she [place] for [place]. [employer] [phone]. Orders faxed to [phone] at [phone]. Pt currently on 3L O2, not on home O2 at baseline. May need O2 at discharge. ICM following for DC planning needs.   Expected Discharge Plan: Home w Home Health Services Barriers to Discharge: Continued Medical Work up               Expected Discharge Plan and Services   Discharge Planning Services: CM Consult   Living arrangements for the past 2 months: Independent Living Facility ([facility])                 DME Arranged: N/A DME Agency: NA       HH Arranged: NA HH Agency: NA         Social Drivers of Health (SDOH) Interventions SDOH Screenings   Food Insecurity: No Food Insecurity (02/14/2024)  Housing: Low Risk (02/14/2024)  Transportation Needs: No Transportation Needs (02/14/2024)  Utilities: Not At Risk (02/14/2024)  Depression (PHQ2-9): Low Risk (12/30/2022)  Social Connections: Moderately Integrated (02/13/2024)  Tobacco Use: High Risk (02/05/2024)    Readmission Risk Interventions     No data to display

## 2024-02-17 NOTE — Progress Notes (Signed)
 "        [facility]                                                                               Kevin Brooks, is a 79 y.o. male, DOB - 24-Nov-1945, FMW:[id number] Admit date - 02/13/2024    Outpatient Primary MD for the patient is Kevin Coy, MD  LOS - 3  days    Brief summary   79 year old male COPD GERD prior smoking previous TIA, Previous pulmonary MAI since 08/2022 treated (rifabutin  azithromycin  ethambutol ) under care of ID finished treatment November 2025. 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 Chest x-ray showed asymmetric changes spiculated 1.9 right lower lobe opacity He was found to be RSV positive  Assessment & Plan    Assessment and Plan:   Acute hypoxic respiratory failure RSV positive, in the setting of COPD and RLL non-small cell lung cancer No wheezing heard on exam, wean oxygen as tolerated. Continue with Brovana , Pulmicort  and a course of antibiotics. Continue with DuoNebs as needed.   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 lobe Recommend outpatient follow-up with oncology and pulmonology     Code Status: Full code DVT Prophylaxis:  enoxaparin  (LOVENOX ) injection 40 mg Start: 02/13/24 1200   Level of Care: Level of care: Progressive Family Communication: None none at bedside  Disposition Plan:     Remains inpatient appropriate: Pending  Procedures:  None  Consultants:   PCCM  Antimicrobials:   Anti-infectives (From admission, onward)    Start     Dose/Rate Route Frequency Ordered Stop   02/16/24 1200  cefTRIAXone  (ROCEPHIN ) 1 g in sodium chloride  0.9 % 100 mL IVPB        1 g 200 mL/hr over 30 Minutes  Intravenous Every 24 hours 02/16/24 0952 02/17/24 1342   02/14/24 1200  cefTRIAXone  (ROCEPHIN ) 1 g in sodium chloride  0.9 % 100 mL IVPB  Status:  Discontinued        1 g 200 mL/hr over 30 Minutes Intravenous Every 24 hours 02/14/24 1129 02/16/24 0952        Medications  Scheduled Meds:  arformoterol   15 mcg Nebulization BID   budesonide  (PULMICORT ) nebulizer solution  0.25 mg Nebulization BID   Chlorhexidine  Gluconate Cloth  6 each Topical Daily   citalopram   40 mg Oral Daily   cloNIDine   0.1 mg Oral BID   clopidogrel   75 mg Oral Once per day on Monday Wednesday Friday   enoxaparin  (LOVENOX ) injection  40 mg Subcutaneous Daily   folic acid   1 mg Oral Daily   losartan   100 mg Oral Daily   multivitamin with minerals  1 tablet Oral Daily   pantoprazole   40 mg Oral Daily   predniSONE   60 mg Oral QAC breakfast   revefenacin   175 mcg Nebulization Daily   tamsulosin   0.4 mg Oral QHS   thiamine   100 mg Oral Daily   Continuous Infusions:   PRN Meds:.acetaminophen  **OR** acetaminophen , hydrALAZINE , ipratropium-albuterol , ondansetron  **OR** ondansetron  (ZOFRAN ) IV, mouth rinse, traZODone     Subjective:   [name] was seen and examined today.   Breathing has improved.   Objective:   Vitals:   02/17/24 0419 02/17/24 0800 02/17/24 0900 02/17/24 1104  BP:  (!) 163/88 (!) 160/103 (!) 144/78  Pulse:  73 67 92  Resp:  (!) 22 17 20   Temp: 97.7 F (36.5 C) 97.9 F (36.6 C)  98.7 F (37.1 C)  TempSrc: Oral Oral  Oral  SpO2:  96% 97% 95%  Weight:      Height:        Intake/Output Summary (Last 24 hours) at 02/17/2024 1554 Last data filed at 02/17/2024 1400 Gross per 24 hour  Intake 2384.11 ml  Output 1375 ml  Net 1009.11 ml   Filed Weights   02/13/24 1354  Weight: 59.9 kg     Exam General exam: Appears calm and comfortable  Respiratory system: Clear to auscultation. Respiratory effort normal. Cardiovascular system: S1 & S2 heard, RRR. No JVD,  Gastrointestinal  system: Abdomen is nondistended, soft and nontender.  Central nervous system: Alert and oriented. Extremities: Symmetric 5 x 5 power. Skin: No rashes,  Psychiatry: Mood & affect appropriate.     Data Reviewed:  I have personally reviewed following labs and imaging studies   CBC Lab Results  Component Value Date   WBC 10.3 02/17/2024   RBC 4.33 02/17/2024   HGB 14.6 02/17/2024   HCT 43.1 02/17/2024   MCV 99.5 02/17/2024   MCH 33.7 02/17/2024   PLT 148 (L) 02/17/2024   MCHC 33.9 02/17/2024   RDW 12.0 02/17/2024   LYMPHSABS 2.0 02/17/2024   MONOABS 0.9 02/17/2024   EOSABS 0.0 02/17/2024   BASOSABS 0.0 02/17/2024     Last metabolic panel Lab Results  Component Value Date   NA 138 02/17/2024   K 4.0 02/17/2024   CL 102 02/17/2024   CO2 29 02/17/2024   BUN 22 02/17/2024   CREATININE 0.76 02/17/2024   GLUCOSE 104 (H) 02/17/2024   GFRNONAA >60 02/17/2024   CALCIUM  8.8 (L) 02/17/2024   PROT 6.7 02/15/2024   ALBUMIN 4.3 02/15/2024   BILITOT 0.5 02/15/2024   ALKPHOS 47 02/15/2024   AST 18 02/15/2024   ALT 13 02/15/2024   ANIONGAP 7 02/17/2024    CBG (last 3)  No results for input(s): GLUCAP in the last 72 hours.    Coagulation Profile: No results for input(s): INR, PROTIME in the last 168 hours.   Radiology Studies: No results found.     Elgie Butter M.D. [facility] 02/17/2024, 3:54 PM  Available via Epic secure chat 7am-7pm After 7 pm, please refer to night coverage provider listed on amion.     "

## 2024-02-17 NOTE — Progress Notes (Signed)
 RT went to see pt for CPT and he was not in the room. RT will give CPT at next rounds.

## 2024-02-18 DIAGNOSIS — J441 Chronic obstructive pulmonary disease with (acute) exacerbation: Secondary | ICD-10-CM | POA: Diagnosis not present

## 2024-02-18 DIAGNOSIS — I10 Essential (primary) hypertension: Secondary | ICD-10-CM | POA: Diagnosis not present

## 2024-02-18 DIAGNOSIS — J205 Acute bronchitis due to respiratory syncytial virus: Secondary | ICD-10-CM | POA: Diagnosis not present

## 2024-02-18 DIAGNOSIS — J210 Acute bronchiolitis due to respiratory syncytial virus: Secondary | ICD-10-CM | POA: Diagnosis not present

## 2024-02-18 LAB — CBC WITH DIFFERENTIAL/PLATELET
Abs Immature Granulocytes: 0.03 10*3/uL (ref 0.00–0.07)
Basophils Absolute: 0 10*3/uL (ref 0.0–0.1)
Basophils Relative: 1 %
Eosinophils Absolute: 0.1 10*3/uL (ref 0.0–0.5)
Eosinophils Relative: 1 %
HCT: 43.1 % (ref 39.0–52.0)
Hemoglobin: 15.1 g/dL (ref 13.0–17.0)
Immature Granulocytes: 0 %
Lymphocytes Relative: 36 %
Lymphs Abs: 3.1 10*3/uL (ref 0.7–4.0)
MCH: 33.9 pg (ref 26.0–34.0)
MCHC: 35 g/dL (ref 30.0–36.0)
MCV: 96.9 fL (ref 80.0–100.0)
Monocytes Absolute: 0.7 10*3/uL (ref 0.1–1.0)
Monocytes Relative: 9 %
Neutro Abs: 4.6 10*3/uL (ref 1.7–7.7)
Neutrophils Relative %: 53 %
Platelets: 161 10*3/uL (ref 150–400)
RBC: 4.45 MIL/uL (ref 4.22–5.81)
RDW: 11.8 % (ref 11.5–15.5)
Smear Review: NORMAL
WBC: 8.6 10*3/uL (ref 4.0–10.5)
nRBC: 0 % (ref 0.0–0.2)

## 2024-02-18 LAB — BASIC METABOLIC PANEL WITH GFR
Anion gap: 9 (ref 5–15)
BUN: 17 mg/dL (ref 8–23)
CO2: 30 mmol/L (ref 22–32)
Calcium: 9.1 mg/dL (ref 8.9–10.3)
Chloride: 100 mmol/L (ref 98–111)
Creatinine, Ser: 0.66 mg/dL (ref 0.61–1.24)
GFR, Estimated: >60 mL/min
Glucose, Bld: 101 mg/dL — ABNORMAL HIGH (ref 70–99)
Potassium: 3.5 mmol/L (ref 3.5–5.1)
Sodium: 138 mmol/L (ref 135–145)

## 2024-02-18 MED ORDER — PREDNISONE 20 MG PO TABS
40.0000 mg | ORAL_TABLET | Freq: Every day | ORAL | Status: AC
  Administered 2024-02-19: 40 mg via ORAL
  Filled 2024-02-18: qty 2

## 2024-02-18 NOTE — Progress Notes (Signed)
 Physical Therapy Treatment Patient Details Name: Kevin Brooks MRN: [id number] DOB: September 02, 1945 Today's Date: 02/18/2024   History of Present Illness Kevin Brooks is a 79 year old male who presented to the ED at [facility] 1/31 now admitted for acute hypoxic respiratory failure secondary to acute COPD exacerbation with underlying RSV infection. PMH: HTN, HLD, lung cancer, COPD, daily alcohol use, BPH, anxiety and depression    PT Comments  Pt ambulated in hallway and monitored SPO2 and HR.  Pt requiring 2L O2 [place] for SPO2 94% and also with elevated HR to 140s (RN aware).  Pt required at least 4 standing rest breaks for fatigue and DOE.  SATURATION QUALIFICATIONS: (This note is used to comply with regulatory documentation for home oxygen)  Patient Saturations on Room Air at Rest = 91%  Patient Saturations on Room Air while Ambulating = 87%  Patient Saturations on 2 Liters of oxygen while Ambulating = 94%  Please briefly explain why patient needs home oxygen: to improve oxygen saturations during physical activities such as ADLs and ambulation.   If plan is discharge home, recommend the following: A little help with bathing/dressing/bathroom;Assist for transportation;Help with stairs or ramp for entrance   Can travel by private vehicle        Equipment Recommendations  None recommended by PT    Recommendations for Other Services       Precautions / Restrictions Precautions Precautions: Other (comment) Precaution/Restrictions Comments: watch sats/RR/HR     Mobility  Bed Mobility Overal bed mobility: Modified Independent                  Transfers Overall transfer level: Needs assistance Equipment used: None Transfers: Sit to/from Stand Sit to Stand: Supervision                Ambulation/Gait Ambulation/Gait assistance: Supervision Gait Distance (Feet): 400 Feet (total) Assistive device: None Gait Pattern/deviations: Step-through pattern, Decreased  stride length       General Gait Details: pt pushed dynamap, required 4 standing rest breaks for breathing, SpO2 monitored and dropped to 88% on room air however with rest breaks able to improve back to 91%, dropped to 87% after 340 ft and HR elevated to 140s so RN requested bumping pt to 3L briefly to recover, HR 120s and SPO2 94% on 2L O2 [place] while ambulating back to room   Stairs             Wheelchair Mobility     Tilt Bed    Modified Rankin (Stroke Patients Only)       Balance                                            Communication Communication Communication: No apparent difficulties  Cognition Arousal: Alert Behavior During Therapy: WFL for tasks assessed/performed   PT - Cognitive impairments: No apparent impairments                         Following commands: Intact      Cueing    Exercises      General Comments        Pertinent Vitals/Pain Pain Assessment Pain Assessment: No/denies pain    Home Living  Prior Function            PT Goals (current goals can now be found in the care plan section) Progress towards PT goals: Progressing toward goals    Frequency    Min 3X/week      PT Plan      Co-evaluation              AM-PAC PT 6 Clicks Mobility   Outcome Measure  Help needed turning from your back to your side while in a flat bed without using bedrails?: None Help needed moving from lying on your back to sitting on the side of a flat bed without using bedrails?: None Help needed moving to and from a bed to a chair (including a wheelchair)?: A Little Help needed standing up from a chair using your arms (e.g., wheelchair or bedside chair)?: A Little Help needed to walk in hospital room?: A Little Help needed climbing 3-5 steps with a railing? : A Lot 6 Click Score: 19    End of Session Equipment Utilized During Treatment: Oxygen Activity Tolerance:  Patient tolerated treatment well Patient left: in bed;with call bell/phone within reach;with family/visitor present Nurse Communication: Mobility status PT Visit Diagnosis: Difficulty in walking, not elsewhere classified (R26.2)     Time: 8475-8445 PT Time Calculation (min) (ACUTE ONLY): 30 min  Charges:    $Gait Training: 23-37 mins PT General Charges $$ ACUTE PT VISIT: 1 Visit                    Tari KLEIN, DPT Physical Therapist Acute Rehabilitation Services Office: [phone]    Tari CROME [place] 02/18/2024, 4:40 PM

## 2024-02-18 NOTE — Progress Notes (Signed)
 Occupational Therapy Treatment Patient Details Name: Kevin Brooks MRN: [id number] DOB: 1945-03-25 Today's Date: 02/18/2024   History of present illness Kevin Brooks is a 79 year old male who presented to the ED at [facility] 1/31 now admitted for acute hypoxic respiratory failure secondary to acute COPD exacerbation with underlying RSV infection. PMH: HTN, HLD, lung cancer, COPD, daily alcohol use, BPH, anxiety and depression   OT comments  Patient seen for skilled OT session this am. Just returning from bathroom upon OT arrival and requesting bed level rest initially. Then open to all therapy presented and tolerated with current status as outlined below. on 1 ltrs O2, able to complete light HEP and ADL's with sats remaining 04-02% on 1 ltr O2, addressed ECT and 5 P's during session with emphasis on pacing and breathing integration. Discharge recommendation for HHOT remains appropriate within ILF setting with friend support as per patient.  Patient requires continued Acute care hospital level OT services to progress safety and functional performance and allow for discharge.        If plan is discharge home, recommend the following:  A little help with walking and/or transfers;A little help with bathing/dressing/bathroom;Assistance with cooking/housework;Assist for transportation;Help with stairs or ramp for entrance   Equipment Recommendations  None recommended by OT       Precautions / Restrictions Precautions Precautions: None Precaution/Restrictions Comments: watch sats/RR/HR Restrictions Weight Bearing Restrictions Per Provider Order: No       Mobility Bed Mobility Overal bed mobility: Modified Independent             General bed mobility comments: moved to EOB and back into bed as per patient request without assist    Transfers Overall transfer level: Needs assistance Equipment used: None Transfers: Sit to/from Stand Sit to Stand: Supervision            General transfer comment: min cues for O2 tubing     Balance Overall balance assessment: Mild deficits observed, not formally tested                                         ADL either performed or assessed with clinical judgement   ADL Overall ADL's : Needs assistance/impaired Eating/Feeding: Modified independent;Sitting   Grooming: Wash/dry hands;Wash/dry face;Oral care;Brushing hair;Sitting;Modified independent   Upper Body Bathing: Set up;Sitting   Lower Body Bathing: Set up;Sit to/from stand   Upper Body Dressing : Set up;Sitting   Lower Body Dressing: Set up;Sit to/from stand Lower Body Dressing Details (indicate cue type and reason): educated on 5 P's and figure 4 techniques Toilet Transfer: Supervision/safety   Toileting- Architect and Hygiene: Modified Manufacturing Engineer Details (indicate cue type and reason): has built in shower seat at home Functional mobility during ADLs: Supervision/safety General ADL Comments: will progress ADL mobility next visit as patient will see PT as well this day for ECT    Extremity/Trunk Assessment Upper Extremity Assessment Upper Extremity Assessment: Generalized weakness;Right hand dominant   Lower Extremity Assessment Lower Extremity Assessment: Defer to PT evaluation        Vision   Vision Assessment?: No apparent visual deficits;Wears glasses for reading   Perception     Praxis     Communication Communication Communication: No apparent difficulties   Cognition Arousal: Alert Behavior During Therapy: WFL for tasks assessed/performed Cognition: No apparent impairments  Following commands: Intact        Cueing   Cueing Techniques: Verbal cues  Exercises Exercises: Other exercises (L2 red tband for triceps press Bly 15 reps x 2 sets, foam grasp ball trainer rec 20 reps Bly q hr)    Shoulder Instructions       General  Comments on 1 ltrs O2, able to complete light HEP and ADL's with sats remaining 04-02% on 1 ltr O2, addressed ECT and 5 P's during session with emphasis on pacing and breathing integ    Pertinent Vitals/ Pain       Pain Assessment Pain Assessment: No/denies pain   Frequency  Min 2X/week        Progress Toward Goals  OT Goals(current goals can now be found in the care plan section)  Progress towards OT goals: Progressing toward goals  Acute Rehab OT Goals Patient Stated Goal: to build up strength OT Goal Formulation: With patient Time For Goal Achievement: 03/01/24 Potential to Achieve Goals: Good ADL Goals Pt Will Perform Lower Body Bathing: with supervision;sit to/from stand Pt Will Perform Lower Body Dressing: with supervision;sit to/from stand Pt Will Transfer to Toilet: with supervision;regular height toilet;ambulating Pt Will Perform Toileting - Clothing Manipulation and hygiene: with supervision;sit to/from stand Pt Will Perform Tub/Shower Transfer: Shower transfer;shower seat;ambulating;grab bars;with supervision Pt/caregiver will Perform Home Exercise Program: Increased strength;Both right and left upper extremity;With written HEP provided;Independently Additional ADL Goal #1: Patient will teach back ECT and breathing strategies with indep during ADL's and mobility  Plan         AM-PAC OT 6 Clicks Daily Activity     Outcome Measure   Help from another person eating meals?: None Help from another person taking care of personal grooming?: None Help from another person toileting, which includes using toliet, bedpan, or urinal?: A Little Help from another person bathing (including washing, rinsing, drying)?: A Little Help from another person to put on and taking off regular upper body clothing?: None Help from another person to put on and taking off regular lower body clothing?: A Little 6 Click Score: 21    End of Session Equipment Utilized During Treatment: Gait  belt;Oxygen  OT Visit Diagnosis: Unsteadiness on feet (R26.81);Muscle weakness (generalized) (M62.81)   Activity Tolerance Patient tolerated treatment well   Patient Left in bed;with call bell/phone within reach;with bed alarm set   Nurse Communication Mobility status        Time: 8969-8884 OT Time Calculation (min): 45 min  Charges: OT General Charges $OT Visit: 1 Visit OT Treatments $Self Care/Home Management : 8-22 mins $Therapeutic Exercise: 8-22 mins  Dilana Mcphie OT/L Acute Rehabilitation Department  [phone]  02/18/2024, 11:31 AM

## 2024-02-18 NOTE — Progress Notes (Signed)
 "        [facility]                                                                               Kevin Brooks, is a 79 y.o. male, DOB - [birth year], FMW:[id number] Admit date - 02/13/2024    Outpatient Primary MD for the patient is Windy Coy, MD  LOS - 4  days    Brief summary   79 year old male COPD GERD prior smoking previous TIA, Previous pulmonary MAI since 08/2022 treated (rifabutin  azithromycin  ethambutol ) under care of ID finished treatment November 2025. 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 Chest x-ray showed asymmetric changes spiculated 1.9 right lower lobe opacity He was found to be RSV positive  Assessment & Plan    Assessment and Plan:   Acute hypoxic respiratory failure RSV positive, in the setting of COPD and RLL non-small cell lung cancer No wheezing heard on exam, wean oxygen as tolerated. Continue with Brovana , Pulmicort  and a course of antibiotics. Continue with DuoNebs as needed. Therapy evaluations later today and check ambulating oxygen levels.  Wean him off oxygen as tolerated.  Currently on a prednisone  taper.    GERD Stable   BPH Continue with Flomax   Essential hypertension  Well controlled.  Resume home meds.    Non-small cell lung cancer in the right lower lobe Recommend outpatient follow-up with oncology and pulmonology     Code Status: Full code DVT Prophylaxis:  enoxaparin  (LOVENOX ) injection 40 mg Start: 02/13/24 1200   Level of Care: Level of care: Progressive Family Communication: None none at bedside  Disposition Plan:     Remains inpatient appropriate: Pending  Procedures:  None  Consultants:   PCCM  Antimicrobials:   Anti-infectives (From admission, onward)    Start     Dose/Rate Route Frequency Ordered Stop   02/16/24 1200   cefTRIAXone  (ROCEPHIN ) 1 g in sodium chloride  0.9 % 100 mL IVPB        1 g 200 mL/hr over 30 Minutes Intravenous Every 24 hours 02/16/24 0952 02/18/24 0951   02/14/24 1200  cefTRIAXone  (ROCEPHIN ) 1 g in sodium chloride  0.9 % 100 mL IVPB  Status:  Discontinued        1 g 200 mL/hr over 30 Minutes Intravenous Every 24 hours 02/14/24 1129 02/16/24 0952        Medications  Scheduled Meds:  arformoterol   15 mcg Nebulization BID   budesonide  (PULMICORT ) nebulizer solution  0.25 mg Nebulization BID   Chlorhexidine  Gluconate Cloth  6 each Topical Daily   citalopram   40 mg Oral Daily   cloNIDine   0.1 mg Oral BID   clopidogrel   75 mg Oral Once per day on Monday Wednesday Friday   enoxaparin  (LOVENOX ) injection  40 mg Subcutaneous Daily   folic acid   1 mg Oral Daily   losartan   100 mg Oral Daily   multivitamin with minerals  1 tablet Oral Daily   pantoprazole   40 mg Oral Daily   [START ON 02/19/2024] predniSONE   40 mg  Oral QAC breakfast   revefenacin   175 mcg Nebulization Daily   tamsulosin   0.4 mg Oral QHS   thiamine   100 mg Oral Daily   Continuous Infusions:   PRN Meds:.acetaminophen  **OR** acetaminophen , hydrALAZINE , ipratropium-albuterol , ondansetron  **OR** ondansetron  (ZOFRAN ) IV, mouth rinse, traZODone     Subjective:   Domonick Sittner was seen and examined today.   No chest pain or sob.   Objective:   Vitals:   02/18/24 0429 02/18/24 0740 02/18/24 0743 02/18/24 1353  BP: (!) 152/83   (!) 150/67  Pulse: 62   80  Resp: 18   (!) 22  Temp: 98.4 F (36.9 C)   97.7 F (36.5 C)  TempSrc: Oral   Oral  SpO2: 97% 95% 95% 95%  Weight:      Height:        Intake/Output Summary (Last 24 hours) at 02/18/2024 1440 Last data filed at 02/18/2024 0827 Gross per 24 hour  Intake 240 ml  Output 2450 ml  Net -2210 ml   Filed Weights   02/13/24 1354  Weight: 59.9 kg     Exam General exam: Appears calm and comfortable  Respiratory system: wheezing posteriorly bilateral. Air entry  fair.  Cardiovascular system: S1 & S2 heard, RRR. No JVD,  Gastrointestinal system: Abdomen is nondistended, soft and nontender. Central nervous system: Alert and oriented.  Extremities: Symmetric 5 x 5 power. Skin: No rashes,  Psychiatry: Mood & affect appropriate.     Data Reviewed:  I have personally reviewed following labs and imaging studies   CBC Lab Results  Component Value Date   WBC 8.6 02/18/2024   RBC 4.45 02/18/2024   HGB 15.1 02/18/2024   HCT 43.1 02/18/2024   MCV 96.9 02/18/2024   MCH 33.9 02/18/2024   PLT 161 02/18/2024   MCHC 35.0 02/18/2024   RDW 11.8 02/18/2024   LYMPHSABS 3.1 02/18/2024   MONOABS 0.7 02/18/2024   EOSABS 0.1 02/18/2024   BASOSABS 0.0 02/18/2024     Last metabolic panel Lab Results  Component Value Date   NA 138 02/18/2024   K 3.5 02/18/2024   CL 100 02/18/2024   CO2 30 02/18/2024   BUN 17 02/18/2024   CREATININE 0.66 02/18/2024   GLUCOSE 101 (H) 02/18/2024   GFRNONAA >60 02/18/2024   CALCIUM  9.1 02/18/2024   PROT 6.7 02/15/2024   ALBUMIN 4.3 02/15/2024   BILITOT 0.5 02/15/2024   ALKPHOS 47 02/15/2024   AST 18 02/15/2024   ALT 13 02/15/2024   ANIONGAP 9 02/18/2024    CBG (last 3)  No results for input(s): GLUCAP in the last 72 hours.    Coagulation Profile: No results for input(s): INR, PROTIME in the last 168 hours.   Radiology Studies: No results found.     Elgie Butter M.D. [facility] 02/18/2024, 2:40 PM  Available via Epic secure chat 7am-7pm After 7 pm, please refer to night coverage provider listed on amion.     "

## 2024-02-19 ENCOUNTER — Encounter (HOSPITAL_COMMUNITY): Payer: Self-pay | Admitting: Internal Medicine

## 2024-02-19 LAB — BASIC METABOLIC PANEL WITH GFR
Anion gap: 7 (ref 5–15)
BUN: 21 mg/dL (ref 8–23)
CO2: 32 mmol/L (ref 22–32)
Calcium: 9.6 mg/dL (ref 8.9–10.3)
Chloride: 97 mmol/L — ABNORMAL LOW (ref 98–111)
Creatinine, Ser: 0.84 mg/dL (ref 0.61–1.24)
GFR, Estimated: >60 mL/min
Glucose, Bld: 88 mg/dL (ref 70–99)
Potassium: 3.6 mmol/L (ref 3.5–5.1)
Sodium: 137 mmol/L (ref 135–145)

## 2024-02-19 LAB — CBC WITH DIFFERENTIAL/PLATELET
Abs Immature Granulocytes: 0.04 10*3/uL (ref 0.00–0.07)
Basophils Absolute: 0 10*3/uL (ref 0.0–0.1)
Basophils Relative: 0 %
Eosinophils Absolute: 0.1 10*3/uL (ref 0.0–0.5)
Eosinophils Relative: 1 %
HCT: 44.2 % (ref 39.0–52.0)
Hemoglobin: 15.5 g/dL (ref 13.0–17.0)
Immature Granulocytes: 0 %
Lymphocytes Relative: 40 %
Lymphs Abs: 3.5 10*3/uL (ref 0.7–4.0)
MCH: 33.9 pg (ref 26.0–34.0)
MCHC: 35.1 g/dL (ref 30.0–36.0)
MCV: 96.7 fL (ref 80.0–100.0)
Monocytes Absolute: 0.7 10*3/uL (ref 0.1–1.0)
Monocytes Relative: 8 %
Neutro Abs: 4.5 10*3/uL (ref 1.7–7.7)
Neutrophils Relative %: 51 %
Platelets: 165 10*3/uL (ref 150–400)
RBC: 4.57 MIL/uL (ref 4.22–5.81)
RDW: 11.8 % (ref 11.5–15.5)
Smear Review: NORMAL
WBC: 8.9 10*3/uL (ref 4.0–10.5)
nRBC: 0 % (ref 0.0–0.2)

## 2024-02-19 NOTE — Progress Notes (Signed)
 OT Cancellation Note  Patient Details Name: Kevin Brooks MRN: [id number] DOB: Dec 06, 1945   Cancelled Treatment:    Reason Eval/Treat Not Completed: Fatigue/lethargy limiting ability to participate  Plan was for OT to return to progress functional mobility as 1st session focus on ECT with ADL's. Patient reports he just finished lunch and felt need for rest. Nursing aware and my amb later with patient. OT will continue to follow as schedule allows.   Darinda [place] OT/L Acute Rehabilitation Department  [phone]   02/19/2024, 2:08 PM

## 2024-02-19 NOTE — NC FL2 (Signed)
 " [place]  MEDICAID FL2 LEVEL OF CARE FORM     IDENTIFICATION  Patient Name: Kevin Brooks Birthdate: 06-19-1945 Sex: male Admission Date (Current Location): 02/13/2024  [facility] and [place] Number:  Producer, Television/film/video and Address:  [facility],  [address], [place] [address]      Provider Number: [id number]  Attending Physician Name and Address:  Cherlyn Labella, MD  Relative Name and Phone Number:  Myer Idol (friend) [phone]    Current Level of Care: Hospital Recommended Level of Care: Skilled Nursing Facility Prior Approval Number:    Date Approved/Denied:   PASRR Number: [id number]  Discharge Plan: SNF    Current Diagnoses: Patient Active Problem List   Diagnosis Date Noted   RSV (acute bronchiolitis due to respiratory syncytial virus) 02/15/2024   Acute bronchitis due to respiratory syncytial virus (RSV) 02/13/2024   COPD exacerbation (HCC) 04/01/2023   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   Lung nodules 08/05/2022   Tobacco use 08/05/2022   Acute respiratory failure with hypoxia (HCC) 12/31/2021   Malignant neoplasm of lower lobe of right lung (HCC) 10/22/2020    Orientation RESPIRATION BLADDER Height & Weight     Self, Time, Situation  O2 (2LNC) Continent Weight: 59.9 kg Height:  5' 7 (170.2 cm)  BEHAVIORAL SYMPTOMS/MOOD NEUROLOGICAL BOWEL NUTRITION STATUS      Continent Diet (regular)  AMBULATORY STATUS COMMUNICATION OF NEEDS Skin   Limited Assist Verbally Other (Comment) (petichiae left arm, hand)                       Personal Care Assistance Level of Assistance  Bathing, Feeding, Dressing Bathing Assistance: Limited assistance Feeding assistance: Independent Dressing Assistance: Limited assistance     Functional Limitations Info  Sight, Hearing, Speech Sight Info: Impaired (reading glasses) Hearing Info: Adequate Speech Info: Adequate    SPECIAL CARE FACTORS FREQUENCY   PT (By licensed PT), OT (By licensed OT)     PT Frequency: 5x/week OT Frequency: 5x/week            Contractures Contractures Info: Not present    Additional Factors Info  Code Status, Allergies Code Status Info: Full Code Allergies Info: NKDA           Current Medications (02/19/2024):  This is the current hospital active medication list Current Facility-Administered Medications  Medication Dose Route Frequency Provider Last Rate Last Admin   acetaminophen  (TYLENOL ) tablet 650 mg  650 mg Oral Q6H PRN Cherlyn Labella, MD       Or   acetaminophen  (TYLENOL ) suppository 650 mg  650 mg Rectal Q6H PRN Cherlyn Labella, MD       arformoterol  (BROVANA ) nebulizer solution 15 mcg  15 mcg Nebulization BID Akula, Vijaya, MD   15 mcg at 02/19/24 9364   budesonide  (PULMICORT ) nebulizer solution 0.25 mg  0.25 mg Nebulization BID Akula, Vijaya, MD   0.25 mg at 02/19/24 9366   Chlorhexidine  Gluconate Cloth 2 % PADS 6 each  6 each Topical Daily Akula, Vijaya, MD   6 each at 02/19/24 9167   citalopram  (CELEXA ) tablet 40 mg  40 mg Oral Daily Akula, Vijaya, MD   40 mg at 02/19/24 0831   cloNIDine  (CATAPRES ) tablet 0.1 mg  0.1 mg Oral BID Akula, Vijaya, MD   0.1 mg at 02/19/24 0831   clopidogrel  (PLAVIX ) tablet 75 mg  75 mg Oral Once per day on Monday Wednesday Friday Cherlyn Labella, MD   [phone]  mg at 02/19/24 9167   enoxaparin  (LOVENOX ) injection 40 mg  40 mg Subcutaneous Daily Akula, Vijaya, MD   40 mg at 02/19/24 9167   folic acid  (FOLVITE ) tablet 1 mg  1 mg Oral Daily Akula, Vijaya, MD   1 mg at 02/19/24 9167   hydrALAZINE  (APRESOLINE ) tablet 25 mg  25 mg Oral Q8H PRN Akula, Vijaya, MD   25 mg at 02/17/24 0303   ipratropium-albuterol  (DUONEB) 0.5-2.5 (3) MG/3ML nebulizer solution 3 mL  3 mL Nebulization Q4H PRN Akula, Vijaya, MD       losartan  (COZAAR ) tablet 100 mg  100 mg Oral Daily Akula, Vijaya, MD   100 mg at 02/19/24 0831   multivitamin with minerals tablet 1 tablet  1 tablet Oral Daily Akula,  Vijaya, MD   1 tablet at 02/19/24 9167   ondansetron  (ZOFRAN ) tablet 4 mg  4 mg Oral Q6H PRN Cherlyn Labella, MD       Or   ondansetron  (ZOFRAN ) injection 4 mg  4 mg Intravenous Q6H PRN Akula, Vijaya, MD       Oral care mouth rinse  15 mL Mouth Rinse PRN Akula, Vijaya, MD       pantoprazole  (PROTONIX ) EC tablet 40 mg  40 mg Oral Daily Akula, Vijaya, MD   40 mg at 02/19/24 9167   predniSONE  (DELTASONE ) tablet 40 mg  40 mg Oral QAC breakfast Akula, Vijaya, MD   40 mg at 02/19/24 0831   revefenacin  (YUPELRI ) nebulizer solution 175 mcg  175 mcg Nebulization Daily Akula, Vijaya, MD   175 mcg at 02/19/24 9365   tamsulosin  (FLOMAX ) capsule 0.4 mg  0.4 mg Oral QHS Akula, Vijaya, MD   0.4 mg at 02/18/24 2133   thiamine  (VITAMIN B1) tablet 100 mg  100 mg Oral Daily Akula, Vijaya, MD   100 mg at 02/19/24 9167   traZODone  (DESYREL ) tablet 25 mg  25 mg Oral QHS PRN Akula, Vijaya, MD   25 mg at 02/18/24 2133     Discharge Medications: Please see discharge summary for a list of discharge medications.  Relevant Imaging Results:  Relevant Lab Results:   Additional Information SSN [id number]  Sonda Manuella Quill, RN     "

## 2024-02-19 NOTE — TOC Progression Note (Addendum)
 Transition of Care [facility]) - Progression Note    Patient Details  Name: Kevin Brooks MRN: [id number] Date of Birth: 07-Mar-1945  Transition of Care [facility]) CM/SW Contact  Sonda Manuella Quill, RN Phone Number: 02/19/2024, 11:39 AM  Clinical Narrative:    Orders received for home oxygen; per Dr Cherlyn pt likely ready for d/c tomorrow; spoke w/ pt in room; he agreed to receive recc services; pt does not have agency preference; spoke w/ Jermaine at [facility]; he said agency can provide service and travel tank will be delivered to room before d/c; pt said his friend Myer will provide transportation; pt given name of providing agency; agency contact info placed in follow up provider section of d/c instructions; notified Edsel, RN for IL at [facility]; she requested orders be faxed to [phone]; orders faxed per request; electronic confirmation received at 1137.  -1552- notified by Sierra, SW at [facility] pt can admit to Rehab RM# [id number]; weekend supervisor can be contacted at [phone] or [phone]; will need D/C summary and FL2 uploaded to hub by 9 am, and hard RX sent w/ pt; she also said they will have oxygen for pt; transport by [facility]; Dr Cherlyn notified via secure chat.  -1704- Jermaine at [facility] notified pt will d/c to SNF and will not need travel tank; agency will pick up tank from hospital room; FL2 completed, sent for co-sign, and sent via hub.  Expected Discharge Plan: Home w Home Health Services Barriers to Discharge: Continued Medical Work up               Expected Discharge Plan and Services   Discharge Planning Services: CM Consult   Living arrangements for the past 2 months: Independent Living Facility ([facility])                 DME Arranged: Oxygen DME Agency: [employer] Date DME Agency Contacted: 02/19/24 Time DME Agency Contacted: 1137 Representative spoke with at DME Agency: [place] HH Arranged: PT, OT HH Agency: Other -  See comment (orders previously faxed to [employer] (contracted agency for [employer] [facility]))         Social Drivers of Health (SDOH) Interventions SDOH Screenings   Food Insecurity: No Food Insecurity (02/14/2024)  Housing: Low Risk (02/14/2024)  Transportation Needs: No Transportation Needs (02/14/2024)  Utilities: Not At Risk (02/14/2024)  Depression (PHQ2-9): Low Risk (12/30/2022)  Social Connections: Moderately Integrated (02/13/2024)  Tobacco Use: High Risk (02/19/2024)    Readmission Risk Interventions     No data to display

## 2024-02-19 NOTE — Progress Notes (Signed)
SATURATION QUALIFICATIONS: (This note is used to comply with regulatory documentation for home oxygen)  Patient Saturations on Room Air at Rest = 91%  Patient Saturations on Room Air while Ambulating = 87%  Patient Saturations on 2 Liters of oxygen while Ambulating = 93%  Please briefly explain why patient needs home oxygen: 

## 2024-02-19 NOTE — Progress Notes (Signed)
 Occupational Therapy Treatment Patient Details Name: Kevin Brooks MRN: [id number] DOB: 01-30-1945 Today's Date: 02/19/2024   History of present illness Kevin Brooks is a 79 year old male who presented to the ED at [facility] 1/31 now admitted for acute hypoxic respiratory failure secondary to acute COPD exacerbation with underlying RSV infection. PMH: HTN, HLD, lung cancer, COPD, daily alcohol use, BPH, anxiety and depression   OT comments  Patient seen for brief visit for OT as just completed hallway amb and now coming from restroom. Will progress functional mobility as per patient request later in day. Educated this visit in use of LH sponge for LB bathing and ECT and issued for home use carryover. Progressing with light UB HEP with strong focus on breathing integration and pacing.  Patient requires continued Acute care hospital level OT services to progress safety and functional performance and allow for discharge.        If plan is discharge home, recommend the following:  A little help with walking and/or transfers;A little help with bathing/dressing/bathroom;Assistance with cooking/housework;Assist for transportation;Help with stairs or ramp for entrance   Equipment Recommendations  None recommended by OT       Precautions / Restrictions Precautions Precautions: Other (comment) (O2) Precaution/Restrictions Comments: watch sats/RR/HR Restrictions Weight Bearing Restrictions Per Provider Order: No       Mobility Bed Mobility Overal bed mobility: Modified Independent             General bed mobility comments: moved to EOB and back into bed as per patient request without assist    Transfers Overall transfer level: Needs assistance Equipment used: None Transfers: Sit to/from Stand, Bed to chair/wheelchair/BSC Sit to Stand: Supervision     Step pivot transfers: Supervision     General transfer comment: min cues for O2 tubing     Balance Overall balance  assessment: Mild deficits observed, not formally tested                                         ADL either performed or assessed with clinical judgement   ADL Overall ADL's : Needs assistance/impaired Eating/Feeding: Modified independent;Sitting   Grooming: Wash/dry hands;Wash/dry face;Oral care;Brushing hair;Standing;Modified independent       Lower Body Bathing: Set up;Sit to/from stand Lower Body Bathing Details (indicate cue type and reason): issued long handled sponge for LB bathing and ECT Upper Body Dressing : Sitting;Modified independent   Lower Body Dressing: Set up Lower Body Dressing Details (indicate cue type and reason): educated on 5 P's and figure 4 techniques Toilet Transfer: Industrial/product Designer Details (indicate cue type and reason): amb to and from bathroom wiht O2 in place 1 ltr Toileting- Clothing Manipulation and Hygiene: Modified independent     Web Designer Details (indicate cue type and reason): has built in shower seat at home Functional mobility during ADLs: Supervision/safety General ADL Comments: patient just returned from hallway amb and coming from bathroom, will see again later for hallway mobility progression to simulate distance to kitchen and bedroom at home    Extremity/Trunk Assessment Upper Extremity Assessment Upper Extremity Assessment: Generalized weakness;Right hand dominant   Lower Extremity Assessment Lower Extremity Assessment: Defer to PT evaluation        Vision   Vision Assessment?: No apparent visual deficits;Wears glasses for reading         Communication Communication Communication: No apparent difficulties  Cognition Arousal: Alert Behavior During Therapy: WFL for tasks assessed/performed Cognition: No apparent impairments                               Following commands: Intact        Cueing   Cueing Techniques: Verbal cues  Exercises  Exercises: Other exercises (patient able to demo and teach back L2 red tband for triceps press Bly 15 reps x 2 sets, foam grasp ball trainer rec 20 reps Bly q hr)       General Comments HR 80 bpm at rest on 1 ltrs O2, able to complete light HEP and ADL's with sats remaining 04-02% on 1 ltr O2, addressed ECT and 5 P's during session with emphasis on pacing and breathing integ    Pertinent Vitals/ Pain       Pain Assessment Pain Assessment: No/denies pain   Frequency  Min 2X/week        Progress Toward Goals  OT Goals(current goals can now be found in the care plan section)  Progress towards OT goals: Progressing toward goals  Acute Rehab OT Goals Patient Stated Goal: to get more mobile OT Goal Formulation: With patient Time For Goal Achievement: 03/01/24 Potential to Achieve Goals: Good ADL Goals Pt Will Perform Lower Body Bathing: with supervision;sit to/from stand Pt Will Perform Lower Body Dressing: with supervision;sit to/from stand Pt Will Transfer to Toilet: with supervision;regular height toilet;ambulating Pt Will Perform Toileting - Clothing Manipulation and hygiene: with supervision;sit to/from stand Pt Will Perform Tub/Shower Transfer: Shower transfer;shower seat;ambulating;grab bars;with supervision Pt/caregiver will Perform Home Exercise Program: Increased strength;Both right and left upper extremity;With written HEP provided;Independently Additional ADL Goal #1: Patient will teach back ECT and breathing strategies with indep during ADL's and mobility  Plan         AM-PAC OT 6 Clicks Daily Activity     Outcome Measure   Help from another person eating meals?: None Help from another person taking care of personal grooming?: None Help from another person toileting, which includes using toliet, bedpan, or urinal?: A Little Help from another person bathing (including washing, rinsing, drying)?: A Little Help from another person to put on and taking off regular  upper body clothing?: None Help from another person to put on and taking off regular lower body clothing?: A Little 6 Click Score: 21    End of Session Equipment Utilized During Treatment: Gait belt;Oxygen  OT Visit Diagnosis: Unsteadiness on feet (R26.81);Muscle weakness (generalized) (M62.81)   Activity Tolerance Patient tolerated treatment well   Patient Left in bed;with call bell/phone within reach;with bed alarm set   Nurse Communication Mobility status        Time: 1145-1200 OT Time Calculation (min): 15 min  Charges: OT General Charges $OT Visit: 1 Visit OT Treatments $Therapeutic Exercise: 8-22 mins  Yulian Gosney OT/L Acute Rehabilitation Department  [phone]  02/19/2024, 12:25 PM

## 2024-02-19 NOTE — Progress Notes (Signed)
" °   02/19/24 2318  BiPAP/CPAP/SIPAP  Reason BIPAP/CPAP not in use Other(comment) (NOT IN ROOM . PATIENT STATED HE HAS NOT WORN THE LAST FEW DAYS. HE IS DOING FINE AT THIS TIME. NOT INDICATED AT THIS TIME.)    "

## 2024-02-19 NOTE — Progress Notes (Signed)
 "        [facility]                                                                               Kevin Brooks, is a 79 y.o. male, DOB - [birth year], FMW:[id number] Admit date - 02/13/2024    Outpatient Primary MD for the patient is Windy Coy, MD  LOS - 5  days    Brief summary   79 year old male COPD GERD prior smoking previous TIA, Previous pulmonary MAI since 08/2022 treated (rifabutin  azithromycin  ethambutol ) under care of ID finished treatment November 2025. 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 Chest x-ray showed asymmetric changes spiculated 1.9 right lower lobe opacity He was found to be RSV positive  Assessment & Plan    Assessment and Plan:   Acute hypoxic respiratory failure RSV positive, in the setting of COPD and RLL non-small cell lung cancer No wheezing heard on exam, wean oxygen as tolerated. Continue with Brovana , Pulmicort  and a course of antibiotics. Continue with DuoNebs as needed. Ambulating oxygen levels checked and he needs 2 lit of [place] oxygen.  Wean him off oxygen as tolerated.  Currently on a prednisone  taper.    GERD Stable   BPH Continue with Flomax   Essential hypertension  Optimal.  Resume home meds.    Non-small cell lung cancer in the right lower lobe Recommend outpatient follow-up with oncology and pulmonology     Code Status: Full code DVT Prophylaxis:  enoxaparin  (LOVENOX ) injection 40 mg Start: 02/13/24 1200   Level of Care: Level of care: Progressive Family Communication: None none at bedside  Disposition Plan:     Remains inpatient appropriate: Pending  Procedures:  None  Consultants:   PCCM  Antimicrobials:   Anti-infectives (From admission, onward)    Start     Dose/Rate Route Frequency Ordered Stop   02/16/24 1200  cefTRIAXone   (ROCEPHIN ) 1 g in sodium chloride  0.9 % 100 mL IVPB        1 g 200 mL/hr over 30 Minutes Intravenous Every 24 hours 02/16/24 0952 02/18/24 0951   02/14/24 1200  cefTRIAXone  (ROCEPHIN ) 1 g in sodium chloride  0.9 % 100 mL IVPB  Status:  Discontinued        1 g 200 mL/hr over 30 Minutes Intravenous Every 24 hours 02/14/24 1129 02/16/24 0952        Medications  Scheduled Meds:  arformoterol   15 mcg Nebulization BID   budesonide  (PULMICORT ) nebulizer solution  0.25 mg Nebulization BID   Chlorhexidine  Gluconate Cloth  6 each Topical Daily   citalopram   40 mg Oral Daily   cloNIDine   0.1 mg Oral BID   clopidogrel   75 mg Oral Once per day on Monday Wednesday Friday   enoxaparin  (LOVENOX ) injection  40 mg Subcutaneous Daily   folic acid   1 mg Oral Daily   losartan   100 mg Oral Daily   multivitamin with minerals  1 tablet Oral Daily   pantoprazole   40 mg Oral Daily   predniSONE   40 mg Oral  QAC breakfast   revefenacin   175 mcg Nebulization Daily   tamsulosin   0.4 mg Oral QHS   thiamine   100 mg Oral Daily   Continuous Infusions:   PRN Meds:.acetaminophen  **OR** acetaminophen , hydrALAZINE , ipratropium-albuterol , ondansetron  **OR** ondansetron  (ZOFRAN ) IV, mouth rinse, traZODone     Subjective:   Kevin Brooks was seen and examined today.   No new complaints.   Objective:   Vitals:   02/19/24 0635 02/19/24 0637 02/19/24 0638 02/19/24 1258  BP:    (!) 166/87  Pulse:    76  Resp:   16 18  Temp:    98.6 F (37 C)  TempSrc:    Oral  SpO2: 96% 96%  96%  Weight:      Height:        Intake/Output Summary (Last 24 hours) at 02/19/2024 1429 Last data filed at 02/19/2024 1301 Gross per 24 hour  Intake 360 ml  Output 1555 ml  Net -1195 ml   Filed Weights   02/13/24 1354  Weight: 59.9 kg     Exam General exam: Appears calm and comfortable  Respiratory system: Clear to auscultation. Respiratory effort normal. Cardiovascular system: S1 & S2 heard, RRR.  Gastrointestinal system:  Abdomen is nondistended, soft and nontender Central nervous system: Alert and oriented.  Extremities: Symmetric 5 x 5 power. Skin: No rashes,  Psychiatry: Mood & affect appropriate.      Data Reviewed:  I have personally reviewed following labs and imaging studies   CBC Lab Results  Component Value Date   WBC 8.9 02/19/2024   RBC 4.57 02/19/2024   HGB 15.5 02/19/2024   HCT 44.2 02/19/2024   MCV 96.7 02/19/2024   MCH 33.9 02/19/2024   PLT 165 02/19/2024   MCHC 35.1 02/19/2024   RDW 11.8 02/19/2024   LYMPHSABS 3.5 02/19/2024   MONOABS 0.7 02/19/2024   EOSABS 0.1 02/19/2024   BASOSABS 0.0 02/19/2024     Last metabolic panel Lab Results  Component Value Date   NA 137 02/19/2024   K 3.6 02/19/2024   CL 97 (L) 02/19/2024   CO2 32 02/19/2024   BUN 21 02/19/2024   CREATININE 0.84 02/19/2024   GLUCOSE 88 02/19/2024   GFRNONAA >60 02/19/2024   CALCIUM  9.6 02/19/2024   PROT 6.7 02/15/2024   ALBUMIN 4.3 02/15/2024   BILITOT 0.5 02/15/2024   ALKPHOS 47 02/15/2024   AST 18 02/15/2024   ALT 13 02/15/2024   ANIONGAP 7 02/19/2024    CBG (last 3)  No results for input(s): GLUCAP in the last 72 hours.    Coagulation Profile: No results for input(s): INR, PROTIME in the last 168 hours.   Radiology Studies: No results found.     Elgie Butter M.D. [facility] 02/19/2024, 2:29 PM  Available via Epic secure chat 7am-7pm After 7 pm, please refer to night coverage provider listed on amion.     "

## 2024-03-16 ENCOUNTER — Ambulatory Visit: Admitting: Emergency Medicine
